# Patient Record
Sex: Female | Born: 1950 | Race: White | Hispanic: No | Marital: Married | Smoking: Never smoker
Health system: Southern US, Community
[De-identification: ages and names within clinical notes are randomized; demographics above are authoritative.]

## PROBLEM LIST (undated history)

## (undated) DIAGNOSIS — I10 Essential (primary) hypertension: Secondary | ICD-10-CM

## (undated) DIAGNOSIS — Z780 Asymptomatic menopausal state: Secondary | ICD-10-CM

## (undated) DIAGNOSIS — K219 Gastro-esophageal reflux disease without esophagitis: Secondary | ICD-10-CM

## (undated) DIAGNOSIS — E079 Disorder of thyroid, unspecified: Secondary | ICD-10-CM

## (undated) DIAGNOSIS — F32A Depression, unspecified: Secondary | ICD-10-CM

## (undated) DIAGNOSIS — K589 Irritable bowel syndrome without diarrhea: Secondary | ICD-10-CM

## (undated) DIAGNOSIS — F329 Major depressive disorder, single episode, unspecified: Secondary | ICD-10-CM

## (undated) DIAGNOSIS — M797 Fibromyalgia: Secondary | ICD-10-CM

## (undated) DIAGNOSIS — F419 Anxiety disorder, unspecified: Secondary | ICD-10-CM

## (undated) DIAGNOSIS — M1990 Unspecified osteoarthritis, unspecified site: Secondary | ICD-10-CM

## (undated) HISTORY — DX: Asymptomatic menopausal state: Z78.0

## (undated) HISTORY — DX: Gastro-esophageal reflux disease without esophagitis: K21.9

## (undated) HISTORY — DX: Irritable bowel syndrome, unspecified: K58.9

## (undated) HISTORY — DX: Disorder of thyroid, unspecified: E07.9

## (undated) HISTORY — DX: Major depressive disorder, single episode, unspecified: F32.9

## (undated) HISTORY — DX: Anxiety disorder, unspecified: F41.9

## (undated) HISTORY — PX: CHOLECYSTECTOMY: SHX55

## (undated) HISTORY — DX: Fibromyalgia: M79.7

## (undated) HISTORY — DX: Depression, unspecified: F32.A

## (undated) HISTORY — DX: Essential (primary) hypertension: I10

---

## 1999-12-09 ENCOUNTER — Other Ambulatory Visit: Admission: RE | Admit: 1999-12-09 | Discharge: 1999-12-09 | Payer: Self-pay | Admitting: *Deleted

## 2001-12-12 ENCOUNTER — Emergency Department (HOSPITAL_COMMUNITY): Admission: EM | Admit: 2001-12-12 | Discharge: 2001-12-12 | Payer: Self-pay | Admitting: Emergency Medicine

## 2001-12-12 ENCOUNTER — Encounter: Payer: Self-pay | Admitting: Emergency Medicine

## 2002-11-01 ENCOUNTER — Other Ambulatory Visit: Admission: RE | Admit: 2002-11-01 | Discharge: 2002-11-01 | Payer: Self-pay | Admitting: Family Medicine

## 2003-06-26 ENCOUNTER — Inpatient Hospital Stay (HOSPITAL_COMMUNITY): Admission: EM | Admit: 2003-06-26 | Discharge: 2003-06-27 | Payer: Self-pay | Admitting: Emergency Medicine

## 2004-01-02 ENCOUNTER — Ambulatory Visit: Payer: Self-pay | Admitting: Family Medicine

## 2004-01-29 ENCOUNTER — Ambulatory Visit: Payer: Self-pay | Admitting: Family Medicine

## 2004-02-12 ENCOUNTER — Ambulatory Visit: Payer: Self-pay | Admitting: Family Medicine

## 2004-02-12 ENCOUNTER — Other Ambulatory Visit: Admission: RE | Admit: 2004-02-12 | Discharge: 2004-02-12 | Payer: Self-pay | Admitting: Family Medicine

## 2004-03-19 ENCOUNTER — Ambulatory Visit: Payer: Self-pay | Admitting: Family Medicine

## 2004-05-13 ENCOUNTER — Ambulatory Visit: Payer: Self-pay | Admitting: Family Medicine

## 2004-05-20 ENCOUNTER — Ambulatory Visit: Payer: Self-pay | Admitting: Family Medicine

## 2004-06-19 ENCOUNTER — Ambulatory Visit: Payer: Self-pay | Admitting: Family Medicine

## 2004-08-07 ENCOUNTER — Ambulatory Visit: Payer: Self-pay | Admitting: Family Medicine

## 2005-01-01 ENCOUNTER — Ambulatory Visit: Payer: Self-pay | Admitting: Family Medicine

## 2005-03-19 ENCOUNTER — Ambulatory Visit: Payer: Self-pay | Admitting: Family Medicine

## 2005-04-09 ENCOUNTER — Ambulatory Visit: Payer: Self-pay | Admitting: Family Medicine

## 2005-11-25 ENCOUNTER — Ambulatory Visit: Payer: Self-pay | Admitting: Family Medicine

## 2005-11-25 LAB — CONVERTED CEMR LAB
ALT: 19 units/L (ref 0–40)
AST: 21 units/L (ref 0–37)
Albumin: 4 g/dL (ref 3.5–5.2)
Alkaline Phosphatase: 53 units/L (ref 39–117)
BUN: 12 mg/dL (ref 6–23)
Basophils Absolute: 0 10*3/uL (ref 0.0–0.1)
Basophils Relative: 0.2 % (ref 0.0–1.0)
CO2: 30 meq/L (ref 19–32)
Calcium: 9.3 mg/dL (ref 8.4–10.5)
Chloride: 105 meq/L (ref 96–112)
Chol/HDL Ratio, serum: 4.5
Cholesterol: 263 mg/dL (ref 0–200)
Creatinine, Ser: 0.8 mg/dL (ref 0.4–1.2)
Eosinophil percent: 0.7 % (ref 0.0–5.0)
GFR calc non Af Amer: 79 mL/min
Glomerular Filtration Rate, Af Am: 96 mL/min/{1.73_m2}
Glucose, Bld: 90 mg/dL (ref 70–99)
HCT: 40.6 % (ref 36.0–46.0)
HDL: 58.4 mg/dL (ref >39.0)
Hemoglobin: 13.7 g/dL (ref 12.0–15.0)
LDL DIRECT: 182.7 mg/dL
Lymphocytes Relative: 31.1 % (ref 12.0–46.0)
MCHC: 33.7 g/dL (ref 30.0–36.0)
MCV: 88.1 fL (ref 78.0–100.0)
Monocytes Absolute: 0.3 10*3/uL (ref 0.2–0.7)
Monocytes Relative: 5.3 % (ref 3.0–11.0)
Neutro Abs: 3.8 10*3/uL (ref 1.4–7.7)
Neutrophils Relative %: 62.7 % (ref 43.0–77.0)
Platelets: 299 10*3/uL (ref 150–400)
Potassium: 3.5 meq/L (ref 3.5–5.1)
RBC: 4.61 M/uL (ref 3.87–5.11)
RDW: 12.1 % (ref 11.5–14.6)
Sodium: 142 meq/L (ref 135–145)
TSH: 1.77 microintl units/mL (ref 0.35–5.50)
Total Bilirubin: 0.5 mg/dL (ref 0.3–1.2)
Total Protein: 6.8 g/dL (ref 6.0–8.3)
Triglyceride fasting, serum: 105 mg/dL (ref 0–149)
VLDL: 21 mg/dL (ref 0–40)
WBC: 5.9 10*3/uL (ref 4.5–10.5)

## 2005-12-02 ENCOUNTER — Other Ambulatory Visit: Admission: RE | Admit: 2005-12-02 | Discharge: 2005-12-02 | Payer: Self-pay | Admitting: Family Medicine

## 2005-12-02 ENCOUNTER — Ambulatory Visit: Payer: Self-pay | Admitting: Family Medicine

## 2005-12-02 ENCOUNTER — Encounter: Payer: Self-pay | Admitting: Family Medicine

## 2006-02-16 ENCOUNTER — Ambulatory Visit: Payer: Self-pay | Admitting: Family Medicine

## 2006-09-09 DIAGNOSIS — K219 Gastro-esophageal reflux disease without esophagitis: Secondary | ICD-10-CM

## 2006-10-06 ENCOUNTER — Telehealth: Payer: Self-pay | Admitting: Family Medicine

## 2006-10-12 ENCOUNTER — Telehealth: Payer: Self-pay | Admitting: Family Medicine

## 2006-12-15 ENCOUNTER — Telehealth: Payer: Self-pay | Admitting: Family Medicine

## 2006-12-28 ENCOUNTER — Ambulatory Visit: Payer: Self-pay | Admitting: Family Medicine

## 2006-12-28 DIAGNOSIS — F341 Dysthymic disorder: Secondary | ICD-10-CM | POA: Insufficient documentation

## 2006-12-28 DIAGNOSIS — D508 Other iron deficiency anemias: Secondary | ICD-10-CM

## 2006-12-30 ENCOUNTER — Telehealth: Payer: Self-pay | Admitting: Family Medicine

## 2007-01-18 ENCOUNTER — Telehealth: Payer: Self-pay | Admitting: Family Medicine

## 2007-02-10 ENCOUNTER — Telehealth: Payer: Self-pay | Admitting: Family Medicine

## 2007-02-12 LAB — CONVERTED CEMR LAB
Basophils Absolute: 0 10*3/uL (ref 0.0–0.1)
Basophils Relative: 0.5 % (ref 0.0–1.0)
Eosinophils Absolute: 0.1 10*3/uL (ref 0.0–0.6)
Eosinophils Relative: 0.7 % (ref 0.0–5.0)
HCT: 38.4 % (ref 36.0–46.0)
Hemoglobin: 13.2 g/dL (ref 12.0–15.0)
Lymphocytes Relative: 18 % (ref 12.0–46.0)
MCHC: 34.4 g/dL (ref 30.0–36.0)
MCV: 87.4 fL (ref 78.0–100.0)
Monocytes Absolute: 0.3 10*3/uL (ref 0.2–0.7)
Monocytes Relative: 3.1 % (ref 3.0–11.0)
Neutro Abs: 6.3 10*3/uL (ref 1.4–7.7)
Neutrophils Relative %: 77.7 % — ABNORMAL HIGH (ref 43.0–77.0)
Platelets: 313 10*3/uL (ref 150–400)
RBC: 4.39 M/uL (ref 3.87–5.11)
RDW: 12.3 % (ref 11.5–14.6)
WBC: 8.2 10*3/uL (ref 4.5–10.5)

## 2007-03-15 ENCOUNTER — Telehealth: Payer: Self-pay | Admitting: Family Medicine

## 2007-04-13 ENCOUNTER — Telehealth: Payer: Self-pay | Admitting: Family Medicine

## 2007-04-13 ENCOUNTER — Encounter: Payer: Self-pay | Admitting: Family Medicine

## 2007-05-03 ENCOUNTER — Ambulatory Visit: Payer: Self-pay | Admitting: Family Medicine

## 2007-10-06 ENCOUNTER — Ambulatory Visit: Payer: Self-pay | Admitting: Family Medicine

## 2007-10-06 DIAGNOSIS — K589 Irritable bowel syndrome without diarrhea: Secondary | ICD-10-CM | POA: Insufficient documentation

## 2007-11-02 ENCOUNTER — Other Ambulatory Visit: Admission: RE | Admit: 2007-11-02 | Discharge: 2007-11-02 | Payer: Self-pay | Admitting: Family Medicine

## 2007-11-02 ENCOUNTER — Encounter: Payer: Self-pay | Admitting: Family Medicine

## 2007-11-02 ENCOUNTER — Ambulatory Visit: Payer: Self-pay | Admitting: Family Medicine

## 2007-11-02 DIAGNOSIS — E538 Deficiency of other specified B group vitamins: Secondary | ICD-10-CM | POA: Insufficient documentation

## 2007-11-02 LAB — CONVERTED CEMR LAB
Bilirubin Urine: NEGATIVE
Blood in Urine, dipstick: NEGATIVE
Glucose, Urine, Semiquant: NEGATIVE
Ketones, urine, test strip: NEGATIVE
Nitrite: NEGATIVE
Specific Gravity, Urine: 1.025
Urobilinogen, UA: 0.2
WBC Urine, dipstick: NEGATIVE
pH: 6

## 2007-11-02 LAB — HM PAP SMEAR

## 2007-11-07 LAB — CONVERTED CEMR LAB
ALT: 27 units/L (ref 0–35)
AST: 27 units/L (ref 0–37)
Albumin: 3.8 g/dL (ref 3.5–5.2)
Alkaline Phosphatase: 54 units/L (ref 39–117)
BUN: 7 mg/dL (ref 6–23)
Basophils Absolute: 0.1 10*3/uL (ref 0.0–0.1)
Basophils Relative: 0.9 % (ref 0.0–3.0)
Bilirubin, Direct: 0.1 mg/dL (ref 0.0–0.3)
CO2: 29 meq/L (ref 19–32)
Calcium: 9.1 mg/dL (ref 8.4–10.5)
Chloride: 105 meq/L (ref 96–112)
Cholesterol: 307 mg/dL (ref 0–200)
Creatinine, Ser: 0.7 mg/dL (ref 0.4–1.2)
Direct LDL: 193.7 mg/dL
Eosinophils Absolute: 0.1 10*3/uL (ref 0.0–0.7)
Eosinophils Relative: 0.6 % (ref 0.0–5.0)
GFR calc Af Amer: 111 mL/min
GFR calc non Af Amer: 92 mL/min
Glucose, Bld: 98 mg/dL (ref 70–99)
HCT: 39.9 % (ref 36.0–46.0)
HDL: 64 mg/dL (ref >39.0)
Hemoglobin: 13.4 g/dL (ref 12.0–15.0)
Lymphocytes Relative: 14.9 % (ref 12.0–46.0)
MCHC: 33.7 g/dL (ref 30.0–36.0)
MCV: 87.4 fL (ref 78.0–100.0)
Monocytes Absolute: 0.4 10*3/uL (ref 0.1–1.0)
Monocytes Relative: 4.8 % (ref 3.0–12.0)
Neutro Abs: 6.8 10*3/uL (ref 1.4–7.7)
Neutrophils Relative %: 78.8 % — ABNORMAL HIGH (ref 43.0–77.0)
Platelets: 272 10*3/uL (ref 150–400)
Potassium: 4 meq/L (ref 3.5–5.1)
RBC: 4.57 M/uL (ref 3.87–5.11)
RDW: 13 % (ref 11.5–14.6)
Sodium: 142 meq/L (ref 135–145)
TSH: 0.91 microintl units/mL (ref 0.35–5.50)
Total Bilirubin: 0.6 mg/dL (ref 0.3–1.2)
Total CHOL/HDL Ratio: 4.8
Total Protein: 7 g/dL (ref 6.0–8.3)
Triglycerides: 152 mg/dL — ABNORMAL HIGH (ref 0–149)
VLDL: 30 mg/dL (ref 0–40)
Vit D, 1,25-Dihydroxy: 7 — ABNORMAL LOW (ref 30–89)
Vitamin B-12: 175 pg/mL — ABNORMAL LOW (ref 211–911)
WBC: 8.7 10*3/uL (ref 4.5–10.5)

## 2007-11-09 ENCOUNTER — Telehealth: Payer: Self-pay | Admitting: Family Medicine

## 2007-12-02 ENCOUNTER — Ambulatory Visit: Payer: Self-pay | Admitting: Family Medicine

## 2007-12-22 ENCOUNTER — Telehealth: Payer: Self-pay | Admitting: Family Medicine

## 2007-12-27 ENCOUNTER — Ambulatory Visit: Payer: Self-pay | Admitting: Family Medicine

## 2007-12-28 ENCOUNTER — Telehealth: Payer: Self-pay | Admitting: Family Medicine

## 2008-02-15 ENCOUNTER — Ambulatory Visit: Payer: Self-pay | Admitting: Family Medicine

## 2008-03-15 ENCOUNTER — Telehealth: Payer: Self-pay | Admitting: Family Medicine

## 2008-03-22 ENCOUNTER — Ambulatory Visit: Payer: Self-pay | Admitting: Family Medicine

## 2008-03-22 ENCOUNTER — Telehealth: Payer: Self-pay | Admitting: Family Medicine

## 2008-04-18 ENCOUNTER — Ambulatory Visit: Payer: Self-pay | Admitting: Family Medicine

## 2008-04-21 LAB — CONVERTED CEMR LAB
Eosinophils Relative: 0.6 % (ref 0.0–5.0)
HCT: 38 % (ref 36.0–46.0)
Lymphs Abs: 1.6 10*3/uL (ref 0.7–4.0)
MCHC: 34.5 g/dL (ref 30.0–36.0)
MCV: 85.4 fL (ref 78.0–100.0)
Monocytes Absolute: 0.3 10*3/uL (ref 0.1–1.0)
Platelets: 268 10*3/uL (ref 150.0–400.0)
RDW: 12.2 % (ref 11.5–14.6)
WBC: 5.8 10*3/uL (ref 4.5–10.5)

## 2008-06-05 ENCOUNTER — Ambulatory Visit: Payer: Self-pay | Admitting: Family Medicine

## 2008-06-05 LAB — CONVERTED CEMR LAB: Cholesterol, target level: 200 mg/dL

## 2008-06-07 ENCOUNTER — Telehealth: Payer: Self-pay | Admitting: Family Medicine

## 2008-06-08 LAB — CONVERTED CEMR LAB
Basophils Absolute: 0 10*3/uL (ref 0.0–0.1)
Basophils Relative: 0.2 % (ref 0.0–3.0)
Eosinophils Absolute: 0 10*3/uL (ref 0.0–0.7)
Eosinophils Relative: 0.3 % (ref 0.0–5.0)
HCT: 41.1 % (ref 36.0–46.0)
Hemoglobin: 13.9 g/dL (ref 12.0–15.0)
Lymphocytes Relative: 25.8 % (ref 12.0–46.0)
Lymphs Abs: 1.9 10*3/uL (ref 0.7–4.0)
MCHC: 33.8 g/dL (ref 30.0–36.0)
MCV: 88.3 fL (ref 78.0–100.0)
Monocytes Absolute: 0.3 10*3/uL (ref 0.1–1.0)
Monocytes Relative: 4.3 % (ref 3.0–12.0)
Neutro Abs: 5.1 10*3/uL (ref 1.4–7.7)
Neutrophils Relative %: 69.4 % (ref 43.0–77.0)
Platelets: 313 10*3/uL (ref 150.0–400.0)
RBC: 4.66 M/uL (ref 3.87–5.11)
RDW: 13.8 % (ref 11.5–14.6)
WBC: 7.3 10*3/uL (ref 4.5–10.5)

## 2008-07-11 ENCOUNTER — Ambulatory Visit: Payer: Self-pay | Admitting: Family Medicine

## 2008-07-11 DIAGNOSIS — M19042 Primary osteoarthritis, left hand: Secondary | ICD-10-CM

## 2008-07-11 DIAGNOSIS — M19041 Primary osteoarthritis, right hand: Secondary | ICD-10-CM

## 2008-10-03 ENCOUNTER — Ambulatory Visit: Payer: Self-pay | Admitting: Family Medicine

## 2008-10-03 LAB — CONVERTED CEMR LAB
Bilirubin Urine: NEGATIVE
Glucose, Urine, Semiquant: NEGATIVE
Nitrite: NEGATIVE
Specific Gravity, Urine: 1.02
pH: 6

## 2008-10-04 ENCOUNTER — Telehealth: Payer: Self-pay | Admitting: Family Medicine

## 2008-10-04 LAB — CONVERTED CEMR LAB
Eosinophils Absolute: 0 10*3/uL (ref 0.0–0.7)
MCHC: 33.3 g/dL (ref 30.0–36.0)
MCV: 88.5 fL (ref 78.0–100.0)
Monocytes Absolute: 0.3 10*3/uL (ref 0.1–1.0)
Neutrophils Relative %: 80.4 % — ABNORMAL HIGH (ref 43.0–77.0)
Platelets: 253 10*3/uL (ref 150.0–400.0)
RDW: 11.9 % (ref 11.5–14.6)

## 2008-11-15 ENCOUNTER — Telehealth: Payer: Self-pay | Admitting: Family Medicine

## 2008-11-15 ENCOUNTER — Ambulatory Visit: Payer: Self-pay | Admitting: Family Medicine

## 2008-11-15 DIAGNOSIS — R350 Frequency of micturition: Secondary | ICD-10-CM

## 2008-11-15 LAB — CONVERTED CEMR LAB
Bilirubin Urine: NEGATIVE
Glucose, Urine, Semiquant: NEGATIVE
Urobilinogen, UA: 0.2
pH: 6.5

## 2008-11-19 ENCOUNTER — Ambulatory Visit: Payer: Self-pay | Admitting: Family Medicine

## 2008-11-21 ENCOUNTER — Telehealth: Payer: Self-pay | Admitting: Family Medicine

## 2008-11-28 ENCOUNTER — Ambulatory Visit: Payer: Self-pay | Admitting: Family Medicine

## 2008-11-28 DIAGNOSIS — R000 Tachycardia, unspecified: Secondary | ICD-10-CM

## 2008-12-02 LAB — CONVERTED CEMR LAB: Vit D, 25-Hydroxy: 15 ng/mL — ABNORMAL LOW (ref 30–89)

## 2008-12-04 LAB — CONVERTED CEMR LAB
ALT: 25 units/L (ref 0–35)
AST: 29 units/L (ref 0–37)
Albumin: 4 g/dL (ref 3.5–5.2)
BUN: 11 mg/dL (ref 6–23)
Basophils Relative: 0.3 % (ref 0.0–3.0)
Chloride: 105 meq/L (ref 96–112)
Eosinophils Relative: 0.8 % (ref 0.0–5.0)
HCT: 37.9 % (ref 36.0–46.0)
Hemoglobin: 13.2 g/dL (ref 12.0–15.0)
Lymphs Abs: 1.6 10*3/uL (ref 0.7–4.0)
MCV: 86.7 fL (ref 78.0–100.0)
Monocytes Absolute: 0.3 10*3/uL (ref 0.1–1.0)
Monocytes Relative: 6.1 % (ref 3.0–12.0)
Neutro Abs: 3.2 10*3/uL (ref 1.4–7.7)
Platelets: 306 10*3/uL (ref 150.0–400.0)
Potassium: 3.7 meq/L (ref 3.5–5.1)
Sodium: 142 meq/L (ref 135–145)
TSH: 1.54 microintl units/mL (ref 0.35–5.50)
Total Bilirubin: 0.7 mg/dL (ref 0.3–1.2)
Total Protein: 6.8 g/dL (ref 6.0–8.3)
VLDL: 28 mg/dL (ref 0.0–40.0)
Vitamin B-12: 562 pg/mL (ref 211–911)
WBC: 5.1 10*3/uL (ref 4.5–10.5)

## 2008-12-12 ENCOUNTER — Encounter: Payer: Self-pay | Admitting: Family Medicine

## 2008-12-14 ENCOUNTER — Ambulatory Visit: Payer: Self-pay

## 2008-12-14 ENCOUNTER — Encounter: Payer: Self-pay | Admitting: Family Medicine

## 2008-12-14 ENCOUNTER — Ambulatory Visit (HOSPITAL_COMMUNITY): Admission: RE | Admit: 2008-12-14 | Discharge: 2008-12-14 | Payer: Self-pay | Admitting: Family Medicine

## 2008-12-14 ENCOUNTER — Ambulatory Visit: Payer: Self-pay | Admitting: Internal Medicine

## 2008-12-19 ENCOUNTER — Telehealth: Payer: Self-pay | Admitting: Family Medicine

## 2008-12-20 ENCOUNTER — Telehealth: Payer: Self-pay | Admitting: Family Medicine

## 2008-12-25 ENCOUNTER — Encounter (INDEPENDENT_AMBULATORY_CARE_PROVIDER_SITE_OTHER): Payer: Self-pay | Admitting: *Deleted

## 2008-12-25 ENCOUNTER — Encounter: Payer: Self-pay | Admitting: Family Medicine

## 2009-01-21 ENCOUNTER — Telehealth: Payer: Self-pay | Admitting: Family Medicine

## 2009-04-04 ENCOUNTER — Telehealth: Payer: Self-pay | Admitting: Family Medicine

## 2009-04-10 ENCOUNTER — Encounter: Payer: Self-pay | Admitting: Family Medicine

## 2009-04-18 ENCOUNTER — Ambulatory Visit: Payer: Self-pay | Admitting: Family Medicine

## 2009-04-18 DIAGNOSIS — M949 Disorder of cartilage, unspecified: Secondary | ICD-10-CM

## 2009-04-18 DIAGNOSIS — M899 Disorder of bone, unspecified: Secondary | ICD-10-CM

## 2009-06-12 ENCOUNTER — Ambulatory Visit: Payer: Self-pay | Admitting: Family Medicine

## 2009-06-12 DIAGNOSIS — M5380 Other specified dorsopathies, site unspecified: Secondary | ICD-10-CM | POA: Insufficient documentation

## 2009-06-13 ENCOUNTER — Telehealth: Payer: Self-pay | Admitting: Family Medicine

## 2009-09-03 ENCOUNTER — Ambulatory Visit: Payer: Self-pay | Admitting: Family Medicine

## 2009-09-03 DIAGNOSIS — K120 Recurrent oral aphthae: Secondary | ICD-10-CM

## 2009-09-03 DIAGNOSIS — N390 Urinary tract infection, site not specified: Secondary | ICD-10-CM

## 2009-09-03 DIAGNOSIS — G43909 Migraine, unspecified, not intractable, without status migrainosus: Secondary | ICD-10-CM

## 2009-09-03 LAB — CONVERTED CEMR LAB
Nitrite: NEGATIVE
Protein, U semiquant: NEGATIVE
Urobilinogen, UA: 0.2

## 2009-10-04 ENCOUNTER — Telehealth: Payer: Self-pay | Admitting: Family Medicine

## 2009-12-18 ENCOUNTER — Ambulatory Visit: Payer: Self-pay

## 2009-12-18 ENCOUNTER — Encounter: Payer: Self-pay | Admitting: Family Medicine

## 2009-12-18 DIAGNOSIS — I6529 Occlusion and stenosis of unspecified carotid artery: Secondary | ICD-10-CM

## 2010-01-28 ENCOUNTER — Telehealth: Payer: Self-pay | Admitting: Family Medicine

## 2010-02-12 ENCOUNTER — Telehealth: Payer: Self-pay | Admitting: Family Medicine

## 2010-02-18 NOTE — Progress Notes (Signed)
Summary: head drainage & into chest st pt  Phone Note Call from Patient Call back at Home Phone [phone]   Call For: Mandy Diaz for [facility] Summary of Call: Dr. Quitman Livings said to get fluid behind ear cleared up.  Cannot take Sudafed.  In past he gave phenavent generic for Extrex LA caps.  Requests new Rx as this one has run out.  Beginning to go into chest.   Could you also call in med for cough that keeps her awake at night. No fever she thinks, but eyes feel hot & swimmy headed.  No transportation today.  [facility] [address] [phone].  Allergic to Pencillin, sulfa,reglan.   Initial call taken by: Rudy Jew, RN,  October 04, 2009 10:42 AM  Follow-up for Phone Call        I am not familiar with phenvent. Do recommend Phenylephrine 10mg  by mouth three times a day as needed congestion, disp # 40, 1 rf and can have Tussionex 1 tsp by mouth at bedtime as needed courgh, 4 oz with out refill if does not improve needs to come in for evaluation Follow-up by: Danise Edge MD,  October 04, 2009 12:05 PM    New/Updated Medications: PHENYLEPHRINE HCL 10 MG TABS (PHENYLEPHRINE HCL) by mouth three times a day as needed for congestion TUSSIONEX PENNKINETIC ER 10-8 MG/5ML LQCR (HYDROCOD POLST-CHLORPHEN POLST) 1 tsp by mouth at bedtime as needed cough Prescriptions: TUSSIONEX PENNKINETIC ER 10-8 MG/5ML LQCR (HYDROCOD POLST-CHLORPHEN POLST) 1 tsp by mouth at bedtime as needed cough  #4 oz x 0   Entered by:   Josph Macho RMA   Authorized by:   Danise Edge MD   Signed by:   Josph Macho RMA on 10/04/2009   Method used:   Telephoned to ...       [facility] [address] [phone]* (retail)       [address]       [place], [place]  [address]       Ph: [phone]       Fax: [phone]   RxID:   [id number] PHENYLEPHRINE HCL 10 MG TABS (PHENYLEPHRINE HCL) by mouth three times a day as needed for congestion  #40 x 1   Entered by:   Josph Macho RMA  Authorized by:   Danise Edge MD   Signed by:   Josph Macho RMA on 10/04/2009   Method used:   Electronically to        [facility] [phone]* (retail)       [address]       [place], [place]  [address]       Ph: [phone]       Fax: [phone]   RxID:   [id number]

## 2010-02-18 NOTE — Progress Notes (Signed)
Summary: refill zolpidem  10 mg   Phone Note From Pharmacy   Caller: [facility] [address] [address] [phone]* Reason for Call: Needs renewal Summary of Call: refill zolpidem 10 mg  Initial call taken by: Pura Spice, RN,  April 04, 2009 4:58 PM  Follow-up for Phone Call        per dr Alfonzo Feller ok x 3 refills  [facility] [address]  Follow-up by: Pura Spice, RN,  April 04, 2009 4:59 PM    New/Updated Medications: ZOLPIDEM TARTRATE 10 MG TABS (ZOLPIDEM TARTRATE) 1 by mouth hs as needed sleep Prescriptions: ZOLPIDEM TARTRATE 10 MG TABS (ZOLPIDEM TARTRATE) 1 by mouth hs as needed sleep  #30 x 3   Entered by:   Pura Spice, RN   Authorized by:   [name] MD   Signed by:   Pura Spice, RN on 04/04/2009   Method used:   Printed then faxed to ...       [facility] [address] [phone]* (retail)       [address]       [place], [place]  [address]       Ph: [phone]       Fax: [phone]   RxID:   [id number]

## 2010-02-18 NOTE — Assessment & Plan Note (Signed)
Summary: BLADDER INF? // RS   Vital Signs:  Patient profile:   60 year old female Menstrual status:  postmenopausal O2 Sat:      95 % Temp:     98.7 degrees F Pulse rate:   110 / minute Pulse rhythm:   regular BP sitting:   136 / 86  Vitals Entered By: Pura Spice, RN (September 03, 2009 4:26 PM) CC: UTI had buring thisvam but burning better and wants refills librax,celexa,ambine promethazine has not had alprzolalm since march wants willbururin andprempro    History of Present Illness: This 60 year old white married female it in complaining of urinary frequency and dysuria this morning some better this afternoon but also has some low abdominal pain no CVA pain and no hematuria Patient is under considerable stress with a mostly healed husband as well as a sick daughter and a grandson that has a cardiac R. Orson Slick been treated at [facility] cardiologists  Center Patient requests multiple refills this will be taken care of Complains of bruising easily and instructed to take vitamin C 500 mg b.i.d. as well as now mild in daily intake of aspirin The patient's main problem is despite taking citalopram and Wellbutrin she continues to be depressed and we have considered adding Cymbalta  Allergies: 1)  ! Pcn 2)  ! Reglan 3)  Amoxicillin (Amoxicillin) 4)  Metoclopramide Hcl (Metoclopramide Hcl) 5)  Sulfamethoxazole (Sulfamethoxazole)  Past History:  Past Medical History: Last updated: 09/09/2006 Menopause GERD Urinary incontinence Migraines  Past Surgical History: Last updated: 09/09/2006 Cholecystectomy  Social History: Last updated: 09/09/2006 Married Never Smoked Alcohol use-no Drug use-no Regular exercise-no  Risk Factors: Smoking Status: never (09/09/2006)  Review of Systems      See HPI  The patient denies anorexia, fever, weight loss, weight gain, vision loss, decreased hearing, hoarseness, chest pain, syncope, dyspnea on exertion, peripheral edema, prolonged  cough, headaches, hemoptysis, abdominal pain, melena, hematochezia, severe indigestion/heartburn, hematuria, incontinence, genital sores, muscle weakness, suspicious skin lesions, transient blindness, difficulty walking, depression, unusual weight change, abnormal bleeding, enlarged lymph nodes, angioedema, breast masses, and testicular masses.    Physical Exam  General:  Well-developed,well-nourished,in no acute distress; alert,appropriate and cooperative throughout examination Head:  Normocephalic and atraumatic without obvious abnormalities. No apparent alopecia or balding. Eyes:  No corneal or conjunctival inflammation noted. EOMI. Perrla. Funduscopic exam benign, without hemorrhages, exudates or papilledema. Vision grossly normal. Ears:  External ear exam shows no significant lesions or deformities.  Otoscopic examination reveals clear canals, tympanic membranes are intact bilaterally without bulging, retraction, inflammation or discharge. Hearing is grossly normal bilaterally. Nose:  External nasal examination shows no deformity or inflammation. Nasal mucosa are pink and moist without lesions or exudates. Mouth:  small ulcerations in the mouth Neck:  No deformities, masses, or tenderness noted. Chest Wall:  No deformities, masses, or tenderness noted. Breasts:  my exam Lungs:  Normal respiratory effort, chest expands symmetrically. Lungs are clear to auscultation, no crackles or wheezes. Heart:  Normal rate and regular rhythm. S1 and S2 normal without gallop, murmur, click, rub or other extra sounds. Abdomen:  tenderness over the suprapubic regionnormal bowel sounds, no distention, no masses, no guarding, no rigidity, no rebound tenderness, no abdominal hernia, no inguinal hernia, no hepatomegaly, and no splenomegaly.   Rectal:  not examined Genitalia:  not examined Msk:  No deformity or scoliosis noted of thoracic or lumbar spine.   Pulses:  R and L carotid,radial,femoral,dorsalis pedis and  posterior tibial pulses are full and  equal bilaterally Extremities:  No clubbing, cyanosis, edema, or deformity noted with normal full range of motion of all joints.   Psych:  Cognition and judgment appear intact. Alert and cooperative with normal attention span and concentration. No apparent delusions, illusions, hallucinations   Impression & Recommendations:  Problem # 1:  MIGRAINE HEADACHE (ICD-346.90) Assessment Deteriorated  Her updated medication list for this problem includes:    Imitrex 100 Mg Tabs (Sumatriptan succinate) .Marland Kitchen... Take 1 at onset headache and may repeat x 1    Isometheptene-apap-dichloral 65-325-100 Mg Caps (Apap-isometheptene-dichloral) .Marland Kitchen... Take 1 capsule by mouth every 8 hrs as needed headache.    Tramadol Hcl 50 Mg Tabs (Tramadol hcl) .Marland Kitchen... 1-2 q4h as needed  for pain    Fioricet 50-325-40 Mg Tabs (Butalbital-apap-caffeine) .Marland Kitchen... 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  Problem # 3:  UTI (ICD-599.0) Assessment: New  Her updated medication list for this problem includes:    Ciprofloxacin Hcl 500 Mg Tabs (Ciprofloxacin hcl) .Marland Kitchen... 1 two times a day for uti  Orders: UA Dipstick w/o Micro (automated)  (81003)  Problem # 4:  FREQUENCY, URINARY (ICD-788.41) Assessment: Deteriorated  Problem # 5:  ARTHRITIS, HANDS, BILATERAL (ICD-716.94) Assessment: Unchanged  Problem # 6:  ANXIETY DEPRESSION (ICD-300.4) Assessment: Deteriorated  Problem # 7:  GERD (ICD-530.81) Assessment: Unchanged  Her updated medication list for this problem includes:    Prilosec 40 Mg Cpdr (Omeprazole) .Marland Kitchen... 1 two times a day for severe gerd    Librax 2.5-5 Mg Caps (Clidinium-chlordiazepoxide) .Marland Kitchen... 1 qid  for irritable bowel  Problem # 8:  IRRITABLE BOWEL SYNDROME (ICD-564.1) Assessment: Unchanged  Complete Medication List: 1)  Prempro 0.3-1.5 Mg Tabs (Conj estrog-medroxyprogest  ace) .Marland Kitchen.. 1 qd 2)  Wellbutrin Xl 300 Mg Tb24 (Bupropion hcl) .... Once daily for depression 3)  Imitrex 100 Mg Tabs (Sumatriptan succinate) .... Take 1 at onset headache and may repeat x 1 4)  Ondansetron Hcl 4 Mg Tabs (Ondansetron hcl) .Marland Kitchen.. 1-2 q4-6 h as needed nausea 5)  Isometheptene-apap-dichloral 65-325-100 Mg Caps (Apap-isometheptene-dichloral) .... Take 1 capsule by mouth every 8 hrs as needed headache. 6)  Zolpidem Tartrate 10 Mg Tabs (Zolpidem tartrate) .Marland Kitchen.. 1 by mouth hs as needed sleep 7)  Celexa 40 Mg Tabs (Citalopram hydrobromide) .Marland Kitchen.. 1 once daily 8)  Alprazolam 0.5 Mg Tabs (Alprazolam) .Marland Kitchen.. 1 morn midafternoon and hs for stress or anxiety 9)  Prilosec 40 Mg Cpdr (Omeprazole) .Marland Kitchen.. 1 two times a day for severe gerd 10)  Vitamin D 16109 Unit Caps (Ergocalciferol) .Marland Kitchen.. 1 by mouth weekly xzx 12 weeks 11)  Cyanocobalamin 1000 Mcg/ml Soln (Cyanocobalamin) .... Inject monthly 12)  Bd Disp Needle 25g X 1" Misc (Needle (disp)) 13)  Zyrtec Allergy 10 Mg Tabs (Cetirizine hcl) .Marland Kitchen.. 1 qd 14)  Valtrex 500 Mg Tabs (Valacyclovir hcl) .Marland Kitchen.. 1 three times a day for viral infection 15)  Tramadol Hcl 50 Mg Tabs (Tramadol hcl) .Marland Kitchen.. 1-2 q4h as needed  for pain 16)  Diazepam 5 Mg Tabs (Diazepam) .Marland Kitchen.. 1 three times a day as needed stress 17)  Fioricet 50-325-40 Mg Tabs (Butalbital-apap-caffeine) .Marland Kitchen.. 1-2 q4h as needed tension headache , not over 4 per day 18)  Actonel 150 Mg Tabs (Risedronate sodium) .Marland Kitchen.. 1 monthly  for osteoporosis 19)  Ciprofloxacin Hcl 500 Mg Tabs (Ciprofloxacin hcl) .Marland Kitchen.. 1 two times a day for uti 20)  Flexeril 10 Mg  Tabs (Cyclobenzaprine hcl) .Marland Kitchen.. 1 morn midafternoon and hs for musclespasm, take only 1,2 if dizzineaa 21)  Promethazine Hcl 25 Mg Tabs (Promethazine hcl) .Marland Kitchen.. 1-2 q4-6 as needed toprevent nausea or vomiting 22)  Librax 2.5-5 Mg Caps (Clidinium-chlordiazepoxide) .Marland Kitchen.. 1 qid  for irritable bowel  Patient Instructions: 1)  no new problem of herpetic stomatitis be  treated with Valtrex t.i.d. for 10 days 2)  Urinary tract infection will be treated with ciprofloxacin 500 mg t.i.d. for 10 day 3)  Every feel all of your other medications for your multiple problems 4)  Return 2 weeks for urinalysis Prescriptions: ALPRAZOLAM 0.5 MG TABS (ALPRAZOLAM) 1 morn midafternoon and hs for stress or anxiety  #90 x 5   Entered by:   Pura Spice, RN   Authorized by:   [name] MD   Signed by:   Pura Spice, RN on 09/04/2009   Method used:   Telephoned to ...       [facility] [address] [phone]* (retail)       [address]       [place], [place]  [address]       Ph: [phone]       Fax: [phone]   RxID:   [id number] ZOLPIDEM TARTRATE 10 MG TABS (ZOLPIDEM TARTRATE) 1 by mouth hs as needed sleep  #30 x 5   Entered by:   Pura Spice, RN   Authorized by:   [name] MD   Signed by:   Pura Spice, RN on 09/04/2009   Method used:   Telephoned to ...       [facility] [phone]* (retail)       [address]       [place], [place]  [address]       Ph: [phone]       Fax: [phone]   RxID:   [id number] LIBRAX 2.5-5 MG CAPS (CLIDINIUM-CHLORDIAZEPOXIDE) 1 qid  for irritable bowel  #120 x 11   Entered and Authorized by:   [name] MD   Signed by:   [name] MD on 09/03/2009   Method used:   Electronically to        [facility] [phone]* (retail)       [address]       [place], [place]  [address]       Ph: [phone]       Fax: [phone]   RxID:   [id number] PROMETHAZINE HCL 25 MG TABS (PROMETHAZINE HCL) 1-2 q4-6 as needed toprevent nausea or vomiting  #60 x 5   Entered and Authorized by:   [name] MD   Signed by:   [name] MD on 09/03/2009   Method used:   Electronically to        [facility] [phone]* (retail)       [address]       [place], [place]  [address]       Ph: [phone]       Fax: [phone]   RxID:   [id number] PRILOSEC 40 MG CPDR (OMEPRAZOLE) 1 two times a day for severe gerd  #60 x 11   Entered and Authorized  by:   [name] MD   Signed by:   [name] MD on 09/03/2009   Method used:   Electronically to        [facility] [phone]* (retail)       [address]       [place], [place]  [address]       Ph: [phone]       Fax: [phone]   RxID:   [id number] CIPROFLOXACIN HCL 500 MG TABS (CIPROFLOXACIN HCL) 1 two times a day for UTI  #30 x 1   Entered and Authorized by:   [name] MD   Signed by:   [name] MD on 09/03/2009   Method used:   Electronically to        [facility] [phone]* (retail)       [address]       [place], [place]  [address]       Ph: [phone]       Fax: [phone]   RxID:   [id number] PREMPRO 0.3-1.5 MG TABS (CONJ ESTROG-MEDROXYPROGEST ACE) 1 qd  #30 x 11   Entered and Authorized by:   [name] MD   Signed by:   [name] MD on 09/03/2009   Method used:   Electronically to        [facility] [phone]* (retail)       [address]       [place], [place]  [address]       Ph: [phone]       Fax: [phone]   RxID:   [id number] CELEXA 40 MG TABS (CITALOPRAM HYDROBROMIDE) 1 once daily  #30 x 11   Entered and Authorized by:   [name] MD   Signed by:   [name] MD on 09/03/2009   Method used:   Electronically to        [facility] [phone]* (retail)       [address]       [place], [place]  [address]       Ph: [phone]       Fax: [phone]   RxID:   [id number] WELLBUTRIN XL 300 MG  TB24 (BUPROPION HCL) once daily for depression  #30 x 11   Entered and Authorized by:   [name] MD    Signed by:   [name] MD on 09/03/2009   Method used:   Electronically to        [facility] [phone]* (retail)       [address]       [place], [place]  [address]       Ph: [phone]       Fax: [phone]   RxID:   [id number] ALPRAZOLAM 0.5 MG TABS (ALPRAZOLAM) 1 morn midafternoon and hs for stress or anxiety  #90 x 5   Entered and Authorized by:   [name] MD   Signed by:   [name] MD on 09/03/2009   Method used:   Print then Give to Patient   RxID:  [id number] ZOLPIDEM TARTRATE 10 MG TABS (ZOLPIDEM TARTRATE) 1 by mouth hs as needed sleep  #30 x 11   Entered and Authorized by:   [name] MD   Signed by:   [name] MD on 09/03/2009   Method used:   Print then Give to Patient   RxID:   [id number]   Laboratory Results   Urine Tests    Routine Urinalysis   Color: yellow Appearance: Clear Glucose: negative   (Normal Range: Negative) Bilirubin: negative   (Normal Range: Negative) Ketone: negative   (Normal Range: Negative) Spec. Gravity: <1.005   (Normal Range: 1.003-1.035) Blood: trace-lysed   (Normal Range: Negative) pH: 5.0   (Normal Range: 5.0-8.0) Protein: negative   (Normal Range: Negative) Urobilinogen: 0.2   (Normal Range: 0-1) Nitrite: negative   (Normal Range: Negative) Leukocyte Esterace: trace   (Normal Range: Negative)    Comments: Rita Ohara  September 03, 2009 4:23 PM

## 2010-02-18 NOTE — Progress Notes (Signed)
Summary: headache  Phone Note Call from Patient   Caller: Patient Call For: [name] MD Summary of Call: Mandy Diaz ([facility]) [phone] Pt states the Toradol or Fioricet is not helping her headache.  Initial call taken by: Lynann Beaver CMA,  Jun 13, 2009 10:45 AM  Follow-up for Phone Call        dr Alfonzo Feller said take both of them at same time then let us know  take 2 of each med at same time and let us know  Follow-up by: Pura Spice, RN,  Jun 13, 2009 10:50 AM  Additional Follow-up for Phone Call Additional follow up Details #1::        Pt notified of Dr. Charmian Muff recommendations. Additional Follow-up by: Lynann Beaver CMA,  Jun 13, 2009 10:54 AM

## 2010-02-18 NOTE — Assessment & Plan Note (Signed)
Summary: med check/discuss new med/cjr   Vital Signs:  Patient profile:   60 year old female Menstrual status:  postmenopausal O2 Sat:      96 % Pulse rate:   108 / minute Pulse rhythm:   regular BP sitting:   140 / 84  (left arm)  Vitals Entered By: Pura Spice, RN (Jun 12, 2009 3:59 PM) CC: back spasms started last nite.    History of Present Illness: This 60 year old white married female drain her back one day ago began having severe muscle spasm of the lumbar spine last light. No radiation of pain but localized of the lumbosacral spine Other complaints and cyst of sore throat with cervical glands. He relates he had fever resolved and in increased headache Next is small bump on the back of her head over the occipital region  Allergies: 1)  ! Pcn 2)  ! Reglan 3)  Amoxicillin (Amoxicillin) 4)  Metoclopramide Hcl (Metoclopramide Hcl) 5)  Sulfamethoxazole (Sulfamethoxazole)  Past History:  Past Medical History: Last updated: 09/09/2006 Menopause GERD Urinary incontinence Migraines  Past Surgical History: Last updated: 09/09/2006 Cholecystectomy  Social History: Last updated: 09/09/2006 Married Never Smoked Alcohol use-no Drug use-no Regular exercise-no  Risk Factors: Smoking Status: never (09/09/2006)  Review of Systems      See HPI  The patient denies anorexia, fever, weight loss, weight gain, vision loss, decreased hearing, hoarseness, chest pain, syncope, dyspnea on exertion, peripheral edema, prolonged cough, headaches, hemoptysis, abdominal pain, melena, hematochezia, severe indigestion/heartburn, hematuria, incontinence, genital sores, muscle weakness, suspicious skin lesions, transient blindness, difficulty walking, depression, unusual weight change, abnormal bleeding, enlarged lymph nodes, angioedema, breast masses, and testicular masses.    Physical Exam  General:  Well-developed,well-nourished,in no acute distress; alert,appropriate and  cooperative throughout examination Head:  small evidence of cellulitis with beginning boil Eyes:  No corneal or conjunctival inflammation noted. EOMI. Perrla. Funduscopic exam benign, without hemorrhages, exudates or papilledema. Vision grossly normal. Ears:  External ear exam shows no significant lesions or deformities.  Otoscopic examination reveals clear canals, tympanic membranes are intact bilaterally without bulging, retraction, inflammation or discharge. Hearing is grossly normal bilaterally. Nose:  minimal congestion Mouth:  pharyngeal erythema.   Lungs:  Normal respiratory effort, chest expands symmetrically. Lungs are clear to auscultation, no crackles or wheezes. Heart:  Normal rate and regular rhythm. S1 and S2 normal without gallop, murmur, click, rub or other extra sounds. Msk:  Mark lumbar muscle spasm bilaterally Skin:  1 cm area of saline this was slight elevation number erythematous and tender   Impression & Recommendations:  Problem # 1:  CELLULITIS (ICD-682.9) Assessment New  Her updated medication list for this problem includes:    Ciprofloxacin Hcl 500 Mg Tabs (Ciprofloxacin hcl) .Marland Kitchen... 1 two times a day for infected throat and boil  Problem # 2:  HEADACHE (ICD-784.0) Assessment: Deteriorated  Her updated medication list for this problem includes:    Imitrex 100 Mg Tabs (Sumatriptan succinate) .Marland Kitchen... Take 1 at onset headache and may repeat x 1    Isometheptene-apap-dichloral 65-325-100 Mg Caps (Apap-isometheptene-dichloral) .Marland Kitchen... Take 1 capsule by mouth every 8 hrs as needed headache.    Tramadol Hcl 50 Mg Tabs (Tramadol hcl) .Marland Kitchen... 1-2 q4h as needed  for pain    Fioricet 50-325-40 Mg Tabs (Butalbital-apap-caffeine) .Marland Kitchen... 1-2 q4h as needed tension headache , not over 4 per day  Problem # 3:  ACUTE PHARYNGITIS (ICD-462) Assessment: Deteriorated  Her updated medication list for this problem includes:    Ciprofloxacin  Hcl 500 Mg Tabs (Ciprofloxacin hcl) .Marland Kitchen... 1 two  times a day for infected throat and boil  Problem # 4:  MUSCLE SPASM, BACK (ICD-724.8) Assessment: New  Her updated medication list for this problem includes:    Tramadol Hcl 50 Mg Tabs (Tramadol hcl) .Marland Kitchen... 1-2 q4h as needed  for pain    Fioricet 50-325-40 Mg Tabs (Butalbital-apap-caffeine) .Marland Kitchen... 1-2 q4h as needed tension headache , not over 4 per day    Flexeril 10 Mg Tabs (Cyclobenzaprine hcl) .Marland Kitchen... 1 morn midafternoon and hs for musclespasm, take only 1,2 if dizzineaa  Problem # 5:  GERD (ICD-530.81) Assessment: Improved  Her updated medication list for this problem includes:    Prilosec 40 Mg Cpdr (Omeprazole) .Marland Kitchen... 1 two times a day for severe gerd  Complete Medication List: 1)  Prempro 0.3-1.5 Mg Tabs (Conj estrog-medroxyprogest ace) .Marland Kitchen.. 1 qd 2)  Wellbutrin Xl 300 Mg Tb24 (Bupropion hcl) .... Once daily 3)  Imitrex 100 Mg Tabs (Sumatriptan succinate) .... Take 1 at onset headache and may repeat x 1 4)  Ondansetron Hcl 4 Mg Tabs (Ondansetron hcl) .Marland Kitchen.. 1-2 q4-6 h as needed nausea 5)  Isometheptene-apap-dichloral 65-325-100 Mg Caps (Apap-isometheptene-dichloral) .... Take 1 capsule by mouth every 8 hrs as needed headache. 6)  Zolpidem Tartrate 10 Mg Tabs (Zolpidem tartrate) .Marland Kitchen.. 1 by mouth hs as needed sleep 7)  Celexa 40 Mg Tabs (Citalopram hydrobromide) .Marland Kitchen.. 1 once daily 8)  Alprazolam 0.5 Mg Tabs (Alprazolam) .Marland Kitchen.. 1 morn midafternoon and hs for stress or anxiety 9)  Prilosec 40 Mg Cpdr (Omeprazole) .Marland Kitchen.. 1 two times a day for severe gerd 10)  Vitamin D [address] Unit Caps (Ergocalciferol) .Marland Kitchen.. 1 by mouth weekly xzx 12 weeks 11)  Cyanocobalamin 1000 Mcg/ml Soln (Cyanocobalamin) .... Inject monthly 12)  Bd Disp Needle 25g X 1" Misc (Needle (disp)) 13)  Zyrtec Allergy 10 Mg Tabs (Cetirizine hcl) .Marland Kitchen.. 1 qd 14)  Valtrex 500 Mg Tabs (Valacyclovir hcl) .Marland Kitchen.. 1 three times a day for viral infection 15)  Tramadol Hcl 50 Mg Tabs (Tramadol hcl) .Marland Kitchen.. 1-2 q4h as needed  for pain 16)   Diazepam 5 Mg Tabs (Diazepam) .Marland Kitchen.. 1 three times a day as needed stress 17)  Fioricet 50-325-40 Mg Tabs (Butalbital-apap-caffeine) .Marland Kitchen.. 1-2 q4h as needed tension headache , not over 4 per day 18)  Actonel 150 Mg Tabs (Risedronate sodium) .Marland Kitchen.. 1 monthly  for osteoporosis 19)  Ciprofloxacin Hcl 500 Mg Tabs (Ciprofloxacin hcl) .Marland Kitchen.. 1 two times a day for infected throat and boil 20)  Flexeril 10 Mg Tabs (Cyclobenzaprine hcl) .Marland Kitchen.. 1 morn midafternoon and hs for musclespasm, take only 1,2 if dizzineaa  Patient Instructions: 1)  acute muscle spasm 2)  flexeril 1 three times a day  3)  cipro 500mg  two times a day 4)   for pharyngitis and boil on neck 5)  good fluid intake 6)  for bruising, leave off ASA for 2 weeks 7)  buy Vitamin C 500mg  1 AM and PM for at least 1 month, repeat if necessary Prescriptions: TRAMADOL HCL 50 MG TABS (TRAMADOL HCL) 1-2 q4h as needed  for pain  #100 x 5   Entered and Authorized by:   [name] MD   Signed by:   [name] MD on 06/12/2009   Method used:   Electronically to        [facility]. [phone]* (retail)       [phone]  [place], [place]  [address]       Ph: [phone]       Fax: [phone]   RxID:   [id number] FLEXERIL 10 MG TABS (CYCLOBENZAPRINE HCL) 1 morn midafternoon and hs for musclespasm, take only 1,2 if dizzineaa  #50 x 3   Entered and Authorized by:   [name] MD   Signed by:   [name] MD on 06/12/2009   Method used:   Electronically to        [facility]. [phone]* (retail)       [address]       [place], [place]  [address]       Ph: [phone]       Fax: [phone]   RxID:   [id number] CIPROFLOXACIN HCL 500 MG TABS (CIPROFLOXACIN HCL) 1 two times a day for infected throat and boil  #30 x 0   Entered and Authorized by:   [name] MD   Signed by:   [name] MD on 06/12/2009   Method  used:   Electronically to        [facility]. [phone]* (retail)       [address]       [place], [place]  [address]       Ph: [phone]       Fax: [phone]   RxID:   [id number]

## 2010-02-18 NOTE — Progress Notes (Signed)
Summary: REQ FOR RESULTS  Phone Note Call from Patient   Caller: Patient @ [phone] Reason for Call: Talk to Nurse Summary of Call: Pt called in for results of Mammogram and Bone Density test done at the beginning of December but has not heard the results of either...Marland KitchenMarland KitchenPt adv they told her that the results of the Bone Density would be available within 2 wks but pt has not received results.... Pt req a return call @ [phone].  Initial call taken by: Debbra Riding,  January 21, 2009 2:23 PM  Follow-up for Phone Call        Results from Bone Density Test received via fax.... same placed in Dr Laurita Quint folder for delivery. Follow-up by: Debbra Riding,  January 21, 2009 3:56 PM  Additional Follow-up for Phone Call Additional follow up Details #1::        discussed with pt Additional Follow-up by: [name] MD,  January 22, 2009 9:05 AM

## 2010-02-18 NOTE — Medication Information (Signed)
Summary: Coverage Approval for Ambien  Coverage Approval for Ambien   Imported By: Maryln Gottron 04/17/2009 13:22:23  _____________________________________________________________________  External Attachment:    Type:   Image     Comment:   External Document

## 2010-02-18 NOTE — Assessment & Plan Note (Signed)
Summary: neck discomfort/njr   Vital Signs:  Patient profile:   60 year old female Menstrual status:  postmenopausal Weight:      156.5 pounds O2 Sat:      97 % Temp:     98.7 degrees F Pulse rate:   102 / minute BP sitting:   130 / 94  (left arm)  Vitals Entered By: Pura Spice, RN (April 18, 2009 2:15 PM) CC: neck discomfort dizzy with standing get off balance stated had hives now resolved on one side body   History of Present Illness: This 60 year old white married female is in today complaining of pain in her lower back as well as some unsteady gait periodically next is comfort have been relieved with ibuprofen at times. She has had a chronic history of episodes of dizziness and notes at times when she gets dizzy she does have some unsteady gait or gets off balance. This is not a persistent problem In the past month the patient states she fell she had house on one side of her body but this has cleared Approximate 2 months ago she began having pain over the generalized abdomen but especially epigastrium after eating and also over the lower abdomen associated with some constipation. In association with the neck pain she at times has pain in the left arm and shoulder. One of her primary complaint today is the fact that she is depressed and more depressed and she has been in the past. She does not want to go out she desires to stay in the beta and does not like to go to work. Since she had been so depressed she has also had problems with insomnia  Allergies: 1)  ! Pcn 2)  ! Reglan 3)  Amoxicillin (Amoxicillin) 4)  Metoclopramide Hcl (Metoclopramide Hcl) 5)  Sulfamethoxazole (Sulfamethoxazole)  Past History:  Past Medical History: Last updated: 09/09/2006 Menopause GERD Urinary incontinence Migraines  Past Surgical History: Last updated: 09/09/2006 Cholecystectomy  Social History: Last updated: 09/09/2006 Married Never Smoked Alcohol use-no Drug use-no Regular  exercise-no  Risk Factors: Smoking Status: never (09/09/2006)  Review of Systems      See HPI General:  See HPI; Complains of fatigue and sleep disorder. Eyes:  Denies blurring, discharge, double vision, eye irritation, eye pain, halos, itching, light sensitivity, red eye, vision loss-1 eye, and vision loss-both eyes. ENT:  Denies decreased hearing, difficulty swallowing, ear discharge, earache, hoarseness, nasal congestion, nosebleeds, postnasal drainage, ringing in ears, sinus pressure, and sore throat. CV:  Denies bluish discoloration of lips or nails, chest pain or discomfort, difficulty breathing at night, difficulty breathing while lying down, fainting, fatigue, leg cramps with exertion, lightheadness, near fainting, palpitations, shortness of breath with exertion, swelling of feet, swelling of hands, and weight gain. Resp:  Denies chest discomfort, chest pain with inspiration, cough, coughing up blood, excessive snoring, hypersomnolence, morning headaches, pleuritic, shortness of breath, sputum productive, and wheezing. GI:  Complains of abdominal pain, change in bowel habits, constipation, and indigestion. GU:  Denies abnormal vaginal bleeding, decreased libido, discharge, dysuria, genital sores, hematuria, incontinence, nocturia, urinary frequency, and urinary hesitancy. MS:  Complains of joint pain. Derm:  See HPI; Complains of rash; urticaria but now better. Neuro:  Denies brief paralysis, difficulty with concentration, disturbances in coordination, falling down, headaches, inability to speak, memory loss, numbness, poor balance, seizures, sensation of room spinning, tingling, tremors, visual disturbances, and weakness. Psych:  Complains of anxiety, depression, easily angered, and easily tearful.  Physical Exam  General:  Well-developed,well-nourished,in no acute distress; alert,appropriate and cooperative throughout examination Head:  Normocephalic and atraumatic without obvious  abnormalities. No apparent alopecia or balding. Eyes:  No corneal or conjunctival inflammation noted. EOMI. Perrla. Funduscopic exam benign, without hemorrhages, exudates or papilledema. Vision grossly normal. Ears:  External ear exam shows no significant lesions or deformities.  Otoscopic examination reveals clear canals, tympanic membranes are intact bilaterally without bulging, retraction, inflammation or discharge. Hearing is grossly normal bilaterally. Nose:  External nasal examination shows no deformity or inflammation. Nasal mucosa are pink and moist without lesions or exudates. Mouth:  Oral mucosa and oropharynx without lesions or exudates.  Teeth in good repair. Neck:  tenderness left cervical spine C2 through 7 Chest Wall:  No deformities, masses, or tenderness noted. Breasts:  No mass, nodules, thickening, tenderness, bulging, retraction, inflamation, nipple discharge or skin changes noted.   Lungs:  Normal respiratory effort, chest expands symmetrically. Lungs are clear to auscultation, no crackles or wheezes. Heart:  Normal rate and regular rhythm. S1 and S2 normal without gallop, murmur, click, rub or other extra sounds. Abdomen:  epigastric tenderness with increased bowel sounds no mass palpable LS and K. negative Rectal:  not examined Genitalia:  not examined Msk:  some tenderness over the left shoulder all movement but no point tenderness Pulses:  R and L carotid,radial,femoral,dorsalis pedis and posterior tibial pulses are full and equal bilaterally Extremities:  No clubbing, cyanosis, edema, or deformity noted with normal full range of motion of all joints.   Neurologic:  No cranial nerve deficits noted. Station and gait are normal. Plantar reflexes are down-going bilaterally. DTRs are symmetrical throughout. Sensory, motor and coordinative functions appear intact.   Impression & Recommendations:  Problem # 1:  OSTEOPENIA (ICD-733.90) Assessment Unchanged  Her updated  medication list for this problem includes:    Vitamin D [address] Unit Caps (Ergocalciferol) .Marland Kitchen... 1 by mouth weekly xzx 12 weeks    Actonel 150 Mg Tabs (Risedronate sodium) .Marland Kitchen... 1 monthly  for osteoporosis  Problem # 2:  SINUS TACHYCARDIA (ICD-427.89) Assessment: Improved  Problem # 3:  JOINT PAIN, HAND (ICD-719.44) Assessment: Unchanged ibuprofen 800 mg t.i.d.  Problem # 4:  VITAMIN B12 DEFICIENCY (ICD-266.2) Assessment: Improved received 1000 micrograms IM  Problem # 5:  ANXIETY DEPRESSION (ICD-300.4) Assessment: Deteriorated head Cymbalta 30 mg daily x1 week then increase to 60 mg number continue Wellbutrin and Celexa  Problem # 6:  IRRITABLE BOWEL SYNDROME (ICD-564.1) Assessment: Unchanged  Problem # 7:  TENSION HEADACHE (ICD-307.81) Assessment: Unchanged  Her updated medication list for this problem includes:    Imitrex 100 Mg Tabs (Sumatriptan succinate) .Marland Kitchen... Take 1 at onset headache and may repeat x 1    Isometheptene-apap-dichloral 65-325-100 Mg Caps (Apap-isometheptene-dichloral) .Marland Kitchen... Take 1 capsule by mouth every 8 hrs as needed headache.    Tramadol Hcl 50 Mg Tabs (Tramadol hcl) .Marland Kitchen... 1-2 q4h as needed  for pain    Fioricet 50-325-40 Mg Tabs (Butalbital-apap-caffeine) .Marland Kitchen... 1-2 q4h as needed tension headache , not over 4 per day  Problem # 8:  GERD (ICD-530.81) Assessment: Deteriorated  Her updated medication list for this problem includes:    Prilosec 40 Mg Cpdr (Omeprazole) .Marland Kitchen... 1 two times a day for severe gerd 4 short time of change to Nexium 40 mg b.i.d. until samples weregoneand then go back to Prozac  Complete Medication List: 1)  Prempro 0.3-1.5 Mg Tabs (Conj estrog-medroxyprogest ace) .Marland Kitchen.. 1 qd 2)  Wellbutrin Xl 300 Mg Tb24 (Bupropion hcl) .... Once daily 3)  Imitrex 100 Mg Tabs (Sumatriptan succinate) .... Take 1 at onset headache and may repeat x 1 4)  Ondansetron Hcl 4 Mg Tabs (Ondansetron hcl) .Marland Kitchen.. 1-2 q4-6 h as needed nausea 5)   Isometheptene-apap-dichloral 65-325-100 Mg Caps (Apap-isometheptene-dichloral) .... Take 1 capsule by mouth every 8 hrs as needed headache. 6)  Zolpidem Tartrate 10 Mg Tabs (Zolpidem tartrate) .Marland Kitchen.. 1 by mouth hs as needed sleep 7)  Celexa 40 Mg Tabs (Citalopram hydrobromide) .Marland Kitchen.. 1 once daily 8)  Alprazolam 0.5 Mg Tabs (Alprazolam) .Marland Kitchen.. 1 morn midafternoon and hs for stress or anxiety 9)  Prilosec 40 Mg Cpdr (Omeprazole) .Marland Kitchen.. 1 two times a day for severe gerd 10)  Vitamin D [address]40 Unit Caps (Ergocalciferol) .Marland Kitchen.. 1 by mouth weekly xzx 12 weeks 11)  Cyanocobalamin 1000 Mcg/ml Soln (Cyanocobalamin) .... Inject monthly 12)  Bd Disp Needle 25g X 1" Misc (Needle (disp)) 13)  Zyrtec Allergy 10 Mg Tabs (Cetirizine hcl) .Marland Kitchen.. 1 qd 14)  Valtrex 500 Mg Tabs (Valacyclovir hcl) .Marland Kitchen.. 1 three times a day for viral infection 15)  Tramadol Hcl 50 Mg Tabs (Tramadol hcl) .Marland Kitchen.. 1-2 q4h as needed  for pain 16)  Diazepam 5 Mg Tabs (Diazepam) .Marland Kitchen.. 1 three times a day as needed stress 17)  Fioricet 50-325-40 Mg Tabs (Butalbital-apap-caffeine) .Marland Kitchen.. 1-2 q4h as needed tension headache , not over 4 per day 18)  Actonel 150 Mg Tabs (Risedronate sodium) .Marland Kitchen.. 1 monthly  for osteoporosis  Patient Instructions: 1)  for depression, chronic, continue welbutrin, celexa and start cymbalta 30 mg each daayb1 week then 60 mg each day 2)  take Nexium 40 mg two times a day 3)  take ibuprofen 800 mg three times a day 4)  after meals 5)  check with [place] regarding [employer] [facility]

## 2010-02-18 NOTE — Miscellaneous (Signed)
Summary: Orders Update  Clinical Lists Changes  Problems: Added new problem of CAROTID ARTERY DISEASE (ICD-433.10) Orders: Added new Test order of Carotid Duplex (Carotid Duplex) - Signed 

## 2010-02-19 ENCOUNTER — Telehealth: Payer: Self-pay | Admitting: *Deleted

## 2010-02-19 NOTE — Telephone Encounter (Signed)
Agree. 911 to ED for evaluation

## 2010-02-19 NOTE — Telephone Encounter (Signed)
Pt states she has been having extreme weakness, and rapid heart rate , BP 180/145           Pulse 155 Also having SOB, no chest pain.  Advised ER ASAP.

## 2010-02-20 NOTE — Progress Notes (Signed)
Summary: doc please call  Phone Note Call from Patient Call back at Home Phone [phone]   Caller: Patient Call For: [name] MD Summary of Call: pt would like doc to call her concering carotid test Initial call taken by: Heron Sabins,  January 28, 2010 12:50 PM  Follow-up for Phone Call        Pt would like to speak Dr. Scotty Court personally about her doppler results. Follow-up by: Romualdo Bolk, CMA Duncan Dull),  January 28, 2010 2:57 PM  Additional Follow-up for Phone Call Additional follow up Details #1::        Per Dr Scotty Court already taken care of Additional Follow-up by: Alfred Levins, CMA,  January 31, 2010 4:17 PM

## 2010-02-26 NOTE — Progress Notes (Signed)
Summary: Pt has no energy,sore glands,high bp,wants call from dr.  Phone Note Call from Patient Call back at Home Phone [phone] Call back at [phone] cell   Caller: Patient Summary of Call: Pt called and said that she is having weakness,no energy, glands hurting. Pt says that her bp was 150/90 and hr was 140.   Pt didn't know if she should make ov with Dr. Scotty Court or what she should do? Initial call taken by: Lucy Antigua,  February 12, 2010 3:26 PM  Follow-up for Phone Call        Pt cannot come in to the office right now, her grandson is sick and she is going to school to get him , and will call back for an appt. Follow-up by: Lynann Beaver CMA AAMA,  February 17, 2010 11:22 AM  Additional Follow-up for Phone Call Additional follow up Details #1::        patient called today and was referred to [facility] Additional Follow-up by: [name] MD,  February 19, 2010 1:34 PM

## 2010-03-05 ENCOUNTER — Telehealth: Payer: Self-pay | Admitting: Family Medicine

## 2010-03-05 NOTE — Telephone Encounter (Signed)
Pt called to check status of getting prior auth of  Zolpidem 10mg  Pt uses [facility]. I told pt that req had been rcvd by [facility], but PA form hasn't been rcvd by insurance co yet. Pt would like to be contacted when PA has been rcvd.

## 2010-03-13 ENCOUNTER — Ambulatory Visit (INDEPENDENT_AMBULATORY_CARE_PROVIDER_SITE_OTHER): Payer: Federal, State, Local not specified - PPO | Admitting: Family Medicine

## 2010-03-13 ENCOUNTER — Encounter: Payer: Self-pay | Admitting: Family Medicine

## 2010-03-13 DIAGNOSIS — R51 Headache: Secondary | ICD-10-CM

## 2010-03-13 DIAGNOSIS — E538 Deficiency of other specified B group vitamins: Secondary | ICD-10-CM

## 2010-03-13 DIAGNOSIS — IMO0001 Reserved for inherently not codable concepts without codable children: Secondary | ICD-10-CM

## 2010-03-13 DIAGNOSIS — K589 Irritable bowel syndrome without diarrhea: Secondary | ICD-10-CM

## 2010-03-13 DIAGNOSIS — K219 Gastro-esophageal reflux disease without esophagitis: Secondary | ICD-10-CM

## 2010-03-13 DIAGNOSIS — M797 Fibromyalgia: Secondary | ICD-10-CM

## 2010-03-13 DIAGNOSIS — E559 Vitamin D deficiency, unspecified: Secondary | ICD-10-CM

## 2010-03-13 MED ORDER — ATENOLOL 25 MG PO TABS: 25.0000 mg | ORAL_TABLET | Freq: Every day | ORAL | Status: DC

## 2010-03-13 MED ORDER — ALPRAZOLAM 0.5 MG PO TABS: 0.5000 mg | ORAL_TABLET | Freq: Three times a day (TID) | ORAL | Status: DC | PRN

## 2010-03-13 MED ORDER — BUTALBITAL-APAP-CAFF-COD 50-325-40-30 MG PO CAPS: 1.0000 | ORAL_CAPSULE | ORAL | Status: DC | PRN

## 2010-03-13 MED ORDER — TRETINOIN MICROSPHERE 0.04 % EX GEL: Freq: Every day | CUTANEOUS | Status: AC

## 2010-03-13 MED ORDER — VALACYCLOVIR HCL 500 MG PO TABS: 500.0000 mg | ORAL_TABLET | Freq: Three times a day (TID) | ORAL | Status: DC

## 2010-03-13 MED ORDER — CYANOCOBALAMIN 1000 MCG/ML IJ SOLN: 1000.0000 ug | INTRAMUSCULAR | Status: DC

## 2010-03-13 MED ORDER — CYCLOBENZAPRINE HCL 10 MG PO TABS: 10.0000 mg | ORAL_TABLET | Freq: Three times a day (TID) | ORAL | Status: DC | PRN

## 2010-03-15 NOTE — Progress Notes (Signed)
  Subjective:    Patient ID: Mandy Diaz, female    DOB: 14-May-1950, 60 y.o.   MRN: [id number] This 60 year old white married female is in today to discuss her more multiple medical problems. She relates she was admitted to [facility] in January with tachycardia as well as a drop in blood pressure. She had general LAM symptoms will await prior to being admitted to [facility]. She had a complete cardiac workup which was all old and noted through explanation for her problem. However she did improve and has been doing fine since she is in today to get refill of her medications. She continued to have tension as well as migraine headaches and needs medication. Interval bowel syndrome as on the good control at this time but needs her medication. She continued to have muscle aches and pains as well as arthritic pains and the myositis is controlled with Cymbalta 30 mg each day. She needs a refill on her anti-nausea medicine because was having a migraine she has problem with nausea vomiting needs oral and well as suppositories for treatmentCardiac rate has been normal with no recent problemHPI    Review of Systemssee H&P for summary of her review of systems. She has no new complaints at this time     Objective:   Physical Exam HEENT negative carotid pulses good thyroid is normal lungs are clear breasts not examined heart normal size regular rhythm no murmurs peripheral pulses are good Abdomen liver spleen kidney nonpalpable no masses felt No lymphadenopathy she mentioned what her sugar will       Assessment & Plan:  2 continue regular medication for her multiple problems as well as check a vitamin D and vitamin B12 level today refill medications

## 2010-03-15 NOTE — Patient Instructions (Signed)
It appears that you are doing very well at this time. Will check vitamin D and vitamin B12 level. Will refill her medications and also will call you results of lab Return if necessary Will obtain Cymbalta samples

## 2010-03-19 ENCOUNTER — Other Ambulatory Visit: Payer: Self-pay | Admitting: Family Medicine

## 2010-03-19 MED ORDER — ALPRAZOLAM 0.5 MG PO TABS: 0.5000 mg | ORAL_TABLET | Freq: Three times a day (TID) | ORAL | Status: DC | PRN

## 2010-03-19 MED ORDER — VITAMIN D3 1.25 MG (50000 UT) PO CAPS: 1.0000 | ORAL_CAPSULE | ORAL | Status: DC

## 2010-04-03 ENCOUNTER — Other Ambulatory Visit: Payer: Self-pay | Admitting: Family Medicine

## 2010-04-07 ENCOUNTER — Other Ambulatory Visit: Payer: Self-pay

## 2010-04-07 MED ORDER — ZOLPIDEM TARTRATE 10 MG PO TABS: 10.0000 mg | ORAL_TABLET | Freq: Every evening | ORAL | Status: DC | PRN

## 2010-04-07 NOTE — Telephone Encounter (Signed)
rx ambien called to [facility]

## 2010-04-08 ENCOUNTER — Other Ambulatory Visit: Payer: Self-pay | Admitting: Family Medicine

## 2010-04-08 NOTE — Telephone Encounter (Signed)
done

## 2010-05-10 ENCOUNTER — Other Ambulatory Visit: Payer: Self-pay | Admitting: Family Medicine

## 2010-05-13 ENCOUNTER — Other Ambulatory Visit: Payer: Self-pay | Admitting: Family Medicine

## 2010-05-13 MED ORDER — DULOXETINE HCL 30 MG PO CPEP: ORAL_CAPSULE | ORAL | Status: DC

## 2010-05-13 NOTE — Telephone Encounter (Signed)
Dr. Scotty Court received the message and said to send an rx to [facility] for Cymbalta 30 mg qd for fibromyalgia.

## 2010-05-13 NOTE — Telephone Encounter (Signed)
Pt called and said that Dr Scotty Court gave pt samples for Cymbalta 30mg  until pts insuarance went through. Pt says that her insurance needs to know that this is not being prescribed for depression, but it is for pts Fibromyalgia. Pt said that Dr Scotty Court was suppose to do a script for this med and send it in to [facility] on [address] [phone]

## 2010-06-06 NOTE — Discharge Summary (Signed)
NAME:  Mandy Diaz, Mandy Diaz                            ACCOUNT NO.:  [id number]   MEDICAL RECORD NO.:  [id number]                   PATIENT TYPE:  INP   LOCATION:  3708                                 FACILITY:  [facility]   PHYSICIAN:  Charlies Constable, M.D. [facility]              DATE OF BIRTH:  01-12-1951   DATE OF ADMISSION:  06/26/2003  DATE OF DISCHARGE:  06/27/2003                                 DISCHARGE SUMMARY   PROCEDURES:  None.   HOSPITAL COURSE:  Mandy Diaz is a 60 year old female with no known history of  coronary artery disease.  She has a history of mitral valve prolapse and she  had a Holter monitor 20 years ago for tachycardia.  She had onset of 8/10  chest pressure that radiated to her right shoulder and was associated with  diaphoresis.  She took a baby aspirin and a clonazepam and received relief  in about 45 minutes.  She had some intermittent chest pains and was admitted  for further evaluation and treatment.   The next day, Mandy Diaz enzymes were negative for MI.  She was evaluated by  Dr. Juanda Chance who felt her risk factor profile was well enough that she could  be evaluated with a Cardiolite as an outpatient.  She is to follow up with  an echocardiogram as well.  She is to see Dr. Juanda Chance in the office after the  testing.  She was considered stable for discharge on June 27, 2003, with  outpatient followup arranged.   CHEST X-RAY:  No acute disease.   LABORATORY VALUES:  Hemoglobin 13.4, hematocrit 40.1, WBC 6.6, platelets  308.  Sodium 142, potassium 3.7, chloride 106, CO2 28, BUN 9, creatinine  0.8, glucose 108.  Serial CK-MB and troponin I negative for MI.  TSH 2.858.  Lipids pending at the time of dictation and are incomplete.   DISCHARGE CONDITION:  Improved.   DISCHARGE DIAGNOSES:  1. Chest pain, negative myocardial infarction by enzymes and evaluation for     angina with Cardiolite as an outpatient.  2. Status post tachy palpitations, evaluated by Holter monitor greater  than     10 years ago.  3. History of mitral valve prolapse.  4. 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  5. History of scarlet fever.  6. History of migraines.  7. History of hyperactive gastrointestinal tract.  8. Family history of heart disease.  9. Allergy or intolerance to penicillin, sulfa, and Reglan.   DISCHARGE INSTRUCTIONS:  Her activity level is to include no strenuous  activity until after the stress test.  She is to be at the office on June 13  at 1:00 p.m. for stress test.  She is to see the PA for Dr. Juanda Chance on June  20 and get an echo at that time as well.   DISCHARGE MEDICATIONS:  1. Toprol-XL 25 mg q.d.  2. Aspirin 81 mg  q.d.  3. Lexapro 20 mg q.d.  4. Librax q.h.s.  5. Nexium 40 mg q.d.  6. Prempro.  7. Wellbutrin 300 mg q.d.      Theodore Demark, P.A. [facility]                  Charlies Constable, M.D. [facility]    RB/MEDQ  D:  06/27/2003  T:  06/28/2003  Job:  [id number]   cc:   Ellin Saba., M.D.  [address] [place]  [place] [address]  Fax: [phone]   Charlies Constable, M.D. [facility]

## 2010-06-06 NOTE — H&P (Signed)
NAME:  Diaz, [name]                            ACCOUNT NO.:  [id number]   MEDICAL RECORD NO.:  [id number]                   PATIENT TYPE:  INP   LOCATION:  1823                                 FACILITY:  [facility]   PHYSICIAN:  Charlies Constable, M.D. [facility]              DATE OF BIRTH:  [birth year]   DATE OF ADMISSION:  06/26/2003  DATE OF DISCHARGE:                                HISTORY & PHYSICAL   PRIMARY PHYSICIAN:  Dr. Tawny Asal.   CHIEF COMPLAINT:  Chest pain.   CLINICAL HISTORY:  Mandy Diaz is 60 years old and has a history of mitral  valve prolapse dating back 20 years ago but no prior history of known  coronary disease.  Sunday, after rushing around, she developed sharp  substernal chest pain  with feeling of pressure and radiation into her right  shoulder associated with some sweating.  She took aspirin and a tranquilizer  and her symptoms resolved after about 45 minutes.  Since that time, she has  had intermittent twinges in her lower sternal region and she went to see Dr.  Scotty Court today, who evaluated her and arranged for her to come to [facility]  by ambulance.   She has had a history of shortness of breath with exertion and limited  exercise tolerance for 20 years and she had the diagnosis of mitral valve  prolapse.  This was based on an echocardiogram about 20 years ago.  SBE  prophylaxis was recommended but she says she never was on a beta blocker.  She did have a Holter monitor done as part of that evaluation 20 years ago.   PAST MEDICAL HISTORY:  Her past medical history is significant for history  of [facility] spotted fever and scarlet fever and she has a history of  migraine and a hyperactive GI tract.   ALLERGIES:  She is allergic to PENICILLIN, SULFA and REGLAN.   CURRENT MEDICATIONS:  Her current medications include:  1. Lexapro 20 mg a day.  2. Librax nightly.  3. Nexium 40 mg daily.  4. Prempro.  5. Wellbutrin 300 mg daily.   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  She has 1 son and 1 daughter.  She does not smoke.   FAMILY HISTORY:  She has a brother who is cared for by Dr. Arturo Morton.  Riley Kill, who is 59 years old and has had 2 myocardial infarctions.  She also  has a mother who has had a stroke and has had carotid stents.   For details of family history, social history and review of systems, please  see Dawn Watt's complete note.   PHYSICAL EXAMINATION:  VITAL SIGNS:  On examination, blood pressure 158/87  and a pulse of 92 and regular.  NECK:  There was no venous distention.  The carotid pulses were full without  bruits.  CHEST:  Chest was clear without rales or rhonchi.  HEART:  The heart rate is regular.  There are no murmurs or gallops and I  could not hear any definite click.  ABDOMEN:  The abdomen was soft with normal bowel sounds.  There was some  mild tenderness in the left lower quadrant.  EXTREMITIES:  Extremities show good pulses and there is no peripheral edema.  MUSCULOSKELETAL:  Musculoskeletal showed no deformities.  NEUROLOGICAL:  Examination showed no focal neurological signs.  SKIN:  Skin is warm and dry.   LABORATORY AND ACCESSORY CLINICAL DATA:  Chest x-ray shows no active  disease.   An EKG showed very minimal nonspecific ST-T changes.   IMPRESSION:  1. Chest pain somewhat atypical for ischemia.  2. History of mitral valve prolapse.  3. History of hypertension but currently on no medications.  4. Positive family history for coronary heart disease.   RECOMMENDATIONS:  We will plan to admit Mrs. Perdue for observation.  If she  has no recurrent symptoms and if her subsequent EKGs and enzymes are  negative, we will plan to evaluate her as an outpatient with a rest stress  Cardiolite scan and echocardiogram.  We will start her on aspirin and beta  blocker but hold off on heparin unless she has recurrent symptoms.                                                 Charlies Constable, M.D. [facility]    BB/MEDQ  D:  06/26/2003  T:  06/27/2003  Job:  [id number]

## 2010-06-19 ENCOUNTER — Other Ambulatory Visit: Payer: Self-pay | Admitting: Family Medicine

## 2010-06-19 NOTE — Telephone Encounter (Signed)
Refill for 1 yr.

## 2010-07-16 ENCOUNTER — Encounter: Payer: Self-pay | Admitting: Family Medicine

## 2010-07-16 ENCOUNTER — Ambulatory Visit
Admission: RE | Admit: 2010-07-16 | Discharge: 2010-07-16 | Disposition: A | Discharge status: Home / Self Care | Payer: Federal, State, Local not specified - PPO | Source: Ambulatory Visit | Attending: Family Medicine | Admitting: Family Medicine

## 2010-07-16 ENCOUNTER — Inpatient Hospital Stay (INDEPENDENT_AMBULATORY_CARE_PROVIDER_SITE_OTHER)
Admission: RE | Admit: 2010-07-16 | Discharge: 2010-07-16 | Disposition: A | Discharge status: Home / Self Care | Payer: Federal, State, Local not specified - PPO | Source: Ambulatory Visit | Attending: Family Medicine | Admitting: Family Medicine

## 2010-07-16 ENCOUNTER — Other Ambulatory Visit: Payer: Self-pay | Admitting: Family Medicine

## 2010-07-16 DIAGNOSIS — S91309A Unspecified open wound, unspecified foot, initial encounter: Secondary | ICD-10-CM

## 2010-07-16 DIAGNOSIS — Z23 Encounter for immunization: Secondary | ICD-10-CM

## 2010-07-18 ENCOUNTER — Telehealth (INDEPENDENT_AMBULATORY_CARE_PROVIDER_SITE_OTHER): Payer: Self-pay | Admitting: Emergency Medicine

## 2010-07-31 ENCOUNTER — Encounter: Payer: Self-pay | Admitting: Internal Medicine

## 2010-07-31 ENCOUNTER — Ambulatory Visit (INDEPENDENT_AMBULATORY_CARE_PROVIDER_SITE_OTHER): Payer: Federal, State, Local not specified - PPO | Admitting: Internal Medicine

## 2010-07-31 VITALS — BP 130/90 | Temp 98.6°F | Wt 172.0 lb

## 2010-07-31 DIAGNOSIS — IMO0002 Reserved for concepts with insufficient information to code with codable children: Secondary | ICD-10-CM

## 2010-07-31 DIAGNOSIS — S90851A Superficial foreign body, right foot, initial encounter: Secondary | ICD-10-CM

## 2010-07-31 NOTE — Progress Notes (Signed)
  Subjective:    Patient ID: Mandy Diaz, female    DOB: 1950/01/26, 60 y.o.   MRN: [id number]  HPI  60 year old patient who injured her right foot 3 weeks ago. She stepped on her wooden deck at that time and suffered a puncture wound involving the plantar aspect of her mid forefoot. She was seen at an urgent care 2 weeks ago but there is no obvious foreign body at that time. The past 3 weeks she has had increasing pain and soft tissue swelling. She's had a very difficult time bearing weight on the area due to the worsening pain   Review of Systems  Musculoskeletal: Positive for gait problem.  Skin: Positive for wound.       Objective:   Physical Exam  Musculoskeletal:       Soft tissue swelling and erythema noted involving the plantar surface of the foot near the base of the third toe. This area was anesthetized with 1% Xylocaine. An I&D was performed with removal of a large wooden splinter          Assessment & Plan:   Status post I&D foreign body right plantar foot  Local wound care discussed

## 2010-07-31 NOTE — Patient Instructions (Signed)
Keep area clean and dry Soak the right foot  twice daily  Call if worsening pain redness or drainage

## 2010-09-04 ENCOUNTER — Other Ambulatory Visit: Payer: Self-pay | Admitting: Family Medicine

## 2010-09-09 ENCOUNTER — Other Ambulatory Visit (INDEPENDENT_AMBULATORY_CARE_PROVIDER_SITE_OTHER): Payer: Federal, State, Local not specified - PPO

## 2010-09-09 ENCOUNTER — Telehealth: Payer: Self-pay | Admitting: Family Medicine

## 2010-09-09 DIAGNOSIS — Z0000 Encounter for general adult medical examination without abnormal findings: Secondary | ICD-10-CM

## 2010-09-09 LAB — CBC WITH DIFFERENTIAL/PLATELET
Eosinophils Relative: 1.2 % (ref 0.0–5.0)
HCT: 36.8 % (ref 36.0–46.0)
Hemoglobin: 12.4 g/dL (ref 12.0–15.0)
Lymphs Abs: 1.9 10*3/uL (ref 0.7–4.0)
MCV: 84.9 fl (ref 78.0–100.0)
Monocytes Absolute: 0.3 10*3/uL (ref 0.1–1.0)
Monocytes Relative: 5.1 % (ref 3.0–12.0)
Neutro Abs: 3.2 10*3/uL (ref 1.4–7.7)
Platelets: 263 10*3/uL (ref 150.0–400.0)
RDW: 15.1 % — ABNORMAL HIGH (ref 11.5–14.6)
WBC: 5.6 10*3/uL (ref 4.5–10.5)

## 2010-09-09 LAB — BASIC METABOLIC PANEL
BUN: 14 mg/dL (ref 6–23)
Chloride: 106 mEq/L (ref 96–112)
GFR: 79.13 mL/min (ref >60.00)
Glucose, Bld: 88 mg/dL (ref 70–99)
Potassium: 3.9 mEq/L (ref 3.5–5.1)
Sodium: 141 mEq/L (ref 135–145)

## 2010-09-09 LAB — POCT URINALYSIS DIPSTICK
Bilirubin, UA: NEGATIVE
Glucose, UA: NEGATIVE
Ketones, UA: NEGATIVE
Spec Grav, UA: 1.015

## 2010-09-09 LAB — VITAMIN B12: Vitamin B-12: 477 pg/mL (ref 211–911)

## 2010-09-09 LAB — HEPATIC FUNCTION PANEL
AST: 20 U/L (ref 0–37)
Alkaline Phosphatase: 56 U/L (ref 39–117)
Bilirubin, Direct: 0 mg/dL (ref 0.0–0.3)

## 2010-09-09 LAB — LIPID PANEL
Total CHOL/HDL Ratio: 4
VLDL: 28.4 mg/dL (ref 0.0–40.0)

## 2010-09-09 LAB — LDL CHOLESTEROL, DIRECT: Direct LDL: 169.1 mg/dL

## 2010-09-09 NOTE — Telephone Encounter (Signed)
Pt would like full thyroid check done from the blood work that was taken this morning. Please contact.

## 2010-09-09 NOTE — Telephone Encounter (Signed)
Called and spoke with pt and she is aware a TSH test was drawn but pt would like to have free t3 and free t4 drawn as well because she is having some issues and has a family history.

## 2010-09-10 LAB — VITAMIN D 25 HYDROXY (VIT D DEFICIENCY, FRACTURES): Vit D, 25-Hydroxy: 20 ng/mL — ABNORMAL LOW (ref 30–89)

## 2010-09-10 NOTE — Telephone Encounter (Signed)
Pt will discuss with Dr. Scotty Court tomorrow.

## 2010-09-11 ENCOUNTER — Encounter: Payer: Self-pay | Admitting: Family Medicine

## 2010-09-11 ENCOUNTER — Other Ambulatory Visit (HOSPITAL_COMMUNITY)
Admission: RE | Admit: 2010-09-11 | Discharge: 2010-09-11 | Disposition: A | Discharge status: Home / Self Care | Payer: Federal, State, Local not specified - PPO | Source: Ambulatory Visit | Attending: Family Medicine | Admitting: Family Medicine

## 2010-09-11 ENCOUNTER — Ambulatory Visit (INDEPENDENT_AMBULATORY_CARE_PROVIDER_SITE_OTHER): Payer: Federal, State, Local not specified - PPO | Admitting: Family Medicine

## 2010-09-11 VITALS — BP 120/80 | HR 98 | Temp 98.4°F | Ht 65.5 in | Wt 157.5 lb

## 2010-09-11 DIAGNOSIS — Z0000 Encounter for general adult medical examination without abnormal findings: Secondary | ICD-10-CM

## 2010-09-11 DIAGNOSIS — F329 Major depressive disorder, single episode, unspecified: Secondary | ICD-10-CM

## 2010-09-11 DIAGNOSIS — G44229 Chronic tension-type headache, not intractable: Secondary | ICD-10-CM

## 2010-09-11 DIAGNOSIS — Z01419 Encounter for gynecological examination (general) (routine) without abnormal findings: Secondary | ICD-10-CM | POA: Insufficient documentation

## 2010-09-11 DIAGNOSIS — M797 Fibromyalgia: Secondary | ICD-10-CM

## 2010-09-11 DIAGNOSIS — E0590 Thyrotoxicosis, unspecified without thyrotoxic crisis or storm: Secondary | ICD-10-CM

## 2010-09-11 NOTE — Patient Instructions (Signed)
One of your diagnoses Colles and a good bit of your problem is fibromyalgia. I would like for you to take one Flexeril at bedtime to relieve the muscle spasm also try one tramadol 4 times a day should that bother you then just reduce it to 2-3 as soon as you can resume Cymbalta will be helpful I feel assured Ibuprofen 800 mg twice a day after meals or at this is for costochondritis help prevent the pain in the upper chest as well as the shoulder region I am going to update all your medicines at the pharmacy so that she will have no problem obtaining them Call you the results of the thyroid test Continue to take your regular medicines for your other problem

## 2010-09-12 LAB — T3, FREE: T3, Free: 2.8 pg/mL (ref 2.3–4.2)

## 2010-09-12 LAB — T4, FREE: Free T4: 0.72 ng/dL (ref 0.60–1.60)

## 2010-09-14 ENCOUNTER — Other Ambulatory Visit: Payer: Self-pay | Admitting: Family Medicine

## 2010-09-16 ENCOUNTER — Telehealth: Payer: Self-pay

## 2010-09-16 NOTE — Progress Notes (Deleted)
done

## 2010-09-16 NOTE — Telephone Encounter (Signed)
Message copied by Beverely Low on Tue Sep 16, 2010 10:49 AM ------      Message from: Harvie Heck.      Created: Mon Sep 15, 2010  9:32 AM       T3-T4 normal

## 2010-09-16 NOTE — Telephone Encounter (Signed)
Lab mailed

## 2010-09-25 ENCOUNTER — Other Ambulatory Visit: Payer: Self-pay | Admitting: Family Medicine

## 2010-09-25 ENCOUNTER — Telehealth: Payer: Self-pay

## 2010-09-25 MED ORDER — TRAMADOL HCL 50 MG PO TABS: 50.0000 mg | ORAL_TABLET | Freq: Four times a day (QID) | ORAL | Status: DC

## 2010-09-25 MED ORDER — CYCLOBENZAPRINE HCL 10 MG PO TABS: 10.0000 mg | ORAL_TABLET | Freq: Every day | ORAL | Status: AC

## 2010-09-25 MED ORDER — OMEPRAZOLE 40 MG PO CPDR: 40.0000 mg | DELAYED_RELEASE_CAPSULE | Freq: Two times a day (BID) | ORAL | Status: DC | PRN

## 2010-09-25 MED ORDER — VITAMIN D3 1.25 MG (50000 UT) PO CAPS: 1.0000 | ORAL_CAPSULE | ORAL | Status: DC

## 2010-09-25 MED ORDER — PROMETHAZINE HCL 25 MG PO TABS: 25.0000 mg | ORAL_TABLET | ORAL | Status: DC

## 2010-09-25 NOTE — Telephone Encounter (Signed)
Pt states she understands her lab work is within normal limits but the results are on the lower range.  Pt would like to know if the results could possibly have something to do with her not having any energy and losing her eyebrows and her nails being brittle. Pls advise.

## 2010-09-25 NOTE — Progress Notes (Deleted)
Quick Note:  Pt aware ______ 

## 2010-09-25 NOTE — Telephone Encounter (Signed)
Pt requesting refill on PREMPRO 0.3-1.5 MG (pt is going out of town tomorrow and is hoping to get the refill today)  Please send to [facility] on [address]  Pt was in for cpe recently and was told her refill prescriptions would be sent to the pharmacy and they haven't been pt is concerned that she will have to go through this process everytime she needs a refill. Please contact pt.

## 2010-10-01 NOTE — Telephone Encounter (Signed)
Pt is aware.  

## 2010-10-01 NOTE — Telephone Encounter (Signed)
Per Dr. Scotty Court pt's thyroid is within normal limits; Dr. Scotty Court states pt has a multiitude of health problems i.e. Fibromyalgia and that can make pt tired.  Pt should contact dermatologist about nails and eyebrows.  Eyebrows are a hard problem to diagnosis.

## 2010-11-18 ENCOUNTER — Other Ambulatory Visit: Payer: Self-pay | Admitting: Family Medicine

## 2010-11-18 NOTE — Telephone Encounter (Signed)
Pt last seen 09/11/10. Pls advise.

## 2010-11-18 NOTE — Telephone Encounter (Signed)
Pt called and is req refills for zolpidem (AMBIEN) 10 MG 30 day supply with refills to [facility] on [address] [phone] Pt is completely out of medicine.

## 2010-11-21 NOTE — Telephone Encounter (Signed)
Call in 6 month supply

## 2010-11-21 NOTE — Telephone Encounter (Signed)
duplicate

## 2010-11-24 MED ORDER — ZOLPIDEM TARTRATE 10 MG PO TABS: 10.0000 mg | ORAL_TABLET | Freq: Every evening | ORAL | Status: DC | PRN

## 2010-11-24 NOTE — Telephone Encounter (Signed)
Called the pharmacy and pt's medication had not been called in.  Called in zolpidem 10 mg #30x 5 rf.

## 2010-11-24 NOTE — Telephone Encounter (Signed)
Please call.

## 2010-12-22 NOTE — Letter (Signed)
Summary: Out of Work  [facility]  [address]   [place], [place] [address]   Phone: [phone]  Fax: [phone]    July 16, 2010   Employee:  [name] [employer]ades    To Whom It May Concern:   For Medical reasons, Mandy Diaz should use crutches for one week.   If you need additional information, please feel free to contact our office.         Sincerely,    Donna Christen MD

## 2010-12-22 NOTE — Progress Notes (Signed)
Summary: SPLINTER IN RIGHT FOOT (proced. rm)   Vital Signs:  Patient Profile:   60 Years Old Female CC:      ?wood in right foot from last night Height:     65 inches Weight:      159 pounds O2 Sat:      98 % O2 treatment:    Room Air Temp:     98.8 degrees F oral Pulse rate:   85 / minute Resp:     12 per minute BP sitting:   130 / 75  (left arm) Cuff size:   regular  Pt. in pain?   yes    Location:   right foot  Vitals Entered By: Lajean Saver RN (July 16, 2010 10:24 AM)                   Updated Prior Medication List: PREMPRO 0.3-1.5 MG TABS (CONJ ESTROG-MEDROXYPROGEST ACE) 1 qd WELLBUTRIN XL 300 MG  TB24 (BUPROPION HCL) once daily for depression IMITREX 100 MG  TABS (SUMATRIPTAN SUCCINATE) take 1 at onset headache and may repeat x 1 ISOMETHEPTENE-APAP-DICHLORAL 65-325-100 MG  CAPS (APAP-ISOMETHEPTENE-DICHLORAL) take 1 capsule by mouth every 8 hrs as needed headache. ZOLPIDEM TARTRATE 10 MG TABS (ZOLPIDEM TARTRATE) 1 by mouth hs as needed sleep CELEXA 40 MG TABS (CITALOPRAM HYDROBROMIDE) 1 once daily ALPRAZOLAM 0.5 MG TABS (ALPRAZOLAM) 1 morn midafternoon and hs for stress or anxiety PRILOSEC 40 MG CPDR (OMEPRAZOLE) 1 two times a day for severe gerd VITAMIN D 56213 UNIT CAPS (ERGOCALCIFEROL) 1 by mouth weekly xzx 12 weeks CYANOCOBALAMIN 1000 MCG/ML SOLN (CYANOCOBALAMIN) inject monthly BD DISP NEEDLE 25G X 1" MISC (NEEDLE (DISP))  ZYRTEC ALLERGY 10 MG TABS (CETIRIZINE HCL) 1 qd VALTREX 500 MG TABS (VALACYCLOVIR HCL) 1 three times a day for viral infection DIAZEPAM 5 MG TABS (DIAZEPAM) 1 three times a day as needed stress FIORICET 50-325-40 MG TABS (BUTALBITAL-APAP-CAFFEINE) 1-2 q4h as needed tension headache , not over 4 per day ACTONEL 150 MG TABS (RISEDRONATE SODIUM) 1 monthly  for osteoporosis FLEXERIL 10 MG TABS (CYCLOBENZAPRINE HCL) 1 morn midafternoon and hs for musclespasm, take only 1,2 if dizzineaa PROMETHAZINE HCL 25 MG TABS (PROMETHAZINE HCL) 1-2  q4-6 as needed toprevent nausea or vomiting LIBRAX 2.5-5 MG CAPS (CLIDINIUM-CHLORDIAZEPOXIDE) 1 qid  for irritable bowel  Current Allergies (reviewed today): ! PCN ! REGLAN AMOXICILLIN (AMOXICILLIN) METOCLOPRAMIDE HCL (METOCLOPRAMIDE HCL) SULFAMETHOXAZOLE (SULFAMETHOXAZOLE)History of Present Illness Chief Complaint: ?wood in right foot from last night History of Present Illness:  Subjective:  Patient's right foot was punctured by splinter from her deck last night.  She was able to easily remove splinter but still has pain and wonders if there may be a retained splinter.  REVIEW OF SYSTEMS Constitutional Symptoms      Denies fever, chills, night sweats, weight loss, weight gain, and fatigue.  Eyes       Denies change in vision, eye pain, eye discharge, glasses, contact lenses, and eye surgery. Ear/Nose/Throat/Mouth       Denies hearing loss/aids, change in hearing, ear pain, ear discharge, dizziness, frequent runny nose, frequent nose bleeds, sinus problems, sore throat, hoarseness, and tooth pain or bleeding.  Respiratory       Denies dry cough, productive cough, wheezing, shortness of breath, asthma, bronchitis, and emphysema/COPD.  Cardiovascular       Denies murmurs, chest pain, and tires easily with exhertion.    Gastrointestinal       Denies stomach pain, nausea/vomiting, diarrhea, constipation, blood in  bowel movements, and indigestion. Genitourniary       Denies painful urination, kidney stones, and loss of urinary control. Neurological       Denies paralysis, seizures, and fainting/blackouts. Musculoskeletal       Denies muscle pain, joint pain, joint stiffness, decreased range of motion, redness, swelling, muscle weakness, and gout.  Skin       Denies bruising, unusual mles/lumps or sores, and hair/skin or nail changes.  Psych       Denies mood changes, temper/anger issues, anxiety/stress, speech problems, depression, and sleep problems. Other Comments: Patient stepped  on a piece of wood from her deck last night. She pulled out the wood, ?piece still in her foot. Open wound, no active bleeding. Last TDaP >10 years   Past History:  Past Medical History: Menopause GERD Urinary incontinence Migraines Depression  Past Surgical History: Reviewed history from 09/09/2006 and no changes required. Cholecystectomy  Family History: Reviewed history from 09/09/2006 and no changes required. Family History of Arthritis Family History Diabetes 1st degree relative Family History High cholesterol Family History of Stroke M 1st degree relative <50 Family History of Cardiovascular disorder  Social History: Reviewed history from 09/09/2006 and no changes required. Married Never Smoked Alcohol use-no Drug use-no Regular exercise-no   Objective:  No acute distress  Right foot plantar surface:  Over MTP joints is a 3mm puncture wound.  No visible foreign body.  No palpable foreign body.  Toes have full range of motion.  Distal neurovascular intact. X-ray right foot:  IMPRESSION: No acute bony findings or radiopaque foreign body. Assessment New Problems: WOUND, FOOT (ICD-892.0) DEPRESSION (ICD-311)  NO EVIDENCE RETAINED FOREIGN BODY  Plan New Medications/Changes: LORTAB 5 5-500 MG TABS (HYDROCODONE-ACETAMINOPHEN) One po q4 to 6hr as needed pain  #10 (ten) x 0, 07/16/2010, Donna Christen MD DOXYCYCLINE HYCLATE 100 MG CAPS (DOXYCYCLINE HYCLATE) One by mouth two times a day  #14 x 0, 07/16/2010, Donna Christen MD  New Orders: T-DG Foot Complete*R* [73630] Tdap => 8yrs IM [90715] Admin 1st Vaccine [90471] Est. Patient Level III [45409] Planning Comments:   Tdap administered Appled Bacitracin/bandage.  Advised to change bandage daily until healed.  Elevate.  Use crutches until pain decreases.  Lortab for pain.  Begin doxycycline. Return for increasing pain, swelling, drainage, heat, etc.   The patient and/or caregiver has been counseled thoroughly  with regard to medications prescribed including dosage, schedule, interactions, rationale for use, and possible side effects and they verbalize understanding.  Diagnoses and expected course of recovery discussed and will return if not improved as expected or if the condition worsens. Patient and/or caregiver verbalized understanding.  Prescriptions: LORTAB 5 5-500 MG TABS (HYDROCODONE-ACETAMINOPHEN) One po q4 to 6hr as needed pain  #10 (ten) x 0   Entered and Authorized by:   Donna Christen MD   Signed by:   Donna Christen MD on 07/16/2010   Method used:   Print then Give to Patient   RxID:   [id number] DOXYCYCLINE HYCLATE 100 MG CAPS (DOXYCYCLINE HYCLATE) One by mouth two times a day  #14 x 0   Entered and Authorized by:   Donna Christen MD   Signed by:   Donna Christen MD on 07/16/2010   Method used:   Print then Give to Patient   RxID:   [id number]   Orders Added: 1)  T-DG Foot Complete*R* [73630] 2)  Tdap => 4yrs IM [32440] 3)  Admin 1st Vaccine [90471] 4)  Est. Patient Level III [10272]  Immunizations Administered:  Tetanus Vaccine:    Vaccine Type: Tdap    Site: left deltoid    Mfr: GlaxoSmithKline    Dose: 0.5 ml    Route: IM    Given by: Lajean Saver RN    Exp. Date: 12/13/2011    Lot #: [id number]    VIS given: 12/07/07 version given July 16, 2010.   Immunizations Administered:  Tetanus Vaccine:    Vaccine Type: Tdap    Site: left deltoid    Mfr: GlaxoSmithKline    Dose: 0.5 ml    Route: IM    Given by: Lajean Saver RN    Exp. Date: 12/13/2011    Lot #: [id number]    VIS given: 12/07/07 version given July 16, 2010.

## 2010-12-22 NOTE — Telephone Encounter (Signed)
  Phone Note Outgoing Call Call back at [facility] Phone [phone]   Call placed by: Lavell Islam RN,  July 18, 2010 6:28 PM Call placed to: Patient Action Taken: Phone Call Completed Summary of Call: Message left on answering machine inquiring about patient's status and encouraging full course of ABX ordered. Initial call taken by: Lavell Islam RN,  July 18, 2010 6:29 PM

## 2010-12-31 ENCOUNTER — Telehealth: Payer: Self-pay | Admitting: Family Medicine

## 2010-12-31 NOTE — Telephone Encounter (Signed)
Pt states towards the end of the Tamiflu she had abdominal cramping and soft stools.  Pt called the urgent care back and was told to continue the Tamiflu even though it may be the cause.  Pt states she is having intestinal griping through her back and achy headache and nausea. Pt states she feels "bad". When pt stands she feels like her heart starts racing and she has to sit down. Pt's temperature has started going back up; now is 99.2.  Pls advise.   Spoke with pt about establishing with a new pcp.  Pt given information for [employer].  Pt states she will call to make an appt.

## 2010-12-31 NOTE — Telephone Encounter (Signed)
Pt went to Urgent care on the 4th and was diagnosed with the flu Tamiflu. Is also having cramps in stomach and was told it could have been caused by the tamiflu. Pt has finished taking medication and symptoms are starting to come back. Pt has not been able to eat much and is not wanting to go back t urgent care. Please contact

## 2010-12-31 NOTE — Telephone Encounter (Signed)
Drink lots of fluids. We should try to work her in to be seen tomorrow if she is still feeling poorly.

## 2011-01-01 ENCOUNTER — Ambulatory Visit (INDEPENDENT_AMBULATORY_CARE_PROVIDER_SITE_OTHER): Payer: Federal, State, Local not specified - PPO | Admitting: Family Medicine

## 2011-01-01 ENCOUNTER — Encounter: Payer: Self-pay | Admitting: Family Medicine

## 2011-01-01 DIAGNOSIS — R5383 Other fatigue: Secondary | ICD-10-CM

## 2011-01-01 DIAGNOSIS — R5381 Other malaise: Secondary | ICD-10-CM

## 2011-01-01 DIAGNOSIS — G43909 Migraine, unspecified, not intractable, without status migrainosus: Secondary | ICD-10-CM

## 2011-01-01 DIAGNOSIS — J019 Acute sinusitis, unspecified: Secondary | ICD-10-CM

## 2011-01-01 LAB — BASIC METABOLIC PANEL
CO2: 27 mEq/L (ref 19–32)
Calcium: 9.2 mg/dL (ref 8.4–10.5)
Creatinine, Ser: 0.8 mg/dL (ref 0.4–1.2)
GFR: 83.94 mL/min (ref >60.00)

## 2011-01-01 LAB — CBC WITH DIFFERENTIAL/PLATELET
Basophils Relative: 0.4 % (ref 0.0–3.0)
Eosinophils Absolute: 0 10*3/uL (ref 0.0–0.7)
Lymphocytes Relative: 23.2 % (ref 12.0–46.0)
MCHC: 33.8 g/dL (ref 30.0–36.0)
Monocytes Relative: 3.9 % (ref 3.0–12.0)
Neutrophils Relative %: 72.4 % (ref 43.0–77.0)
RBC: 4.38 Mil/uL (ref 3.87–5.11)
WBC: 8.3 10*3/uL (ref 4.5–10.5)

## 2011-01-01 MED ORDER — AZITHROMYCIN 250 MG PO TABS: ORAL_TABLET | ORAL | Status: AC

## 2011-01-01 MED ORDER — KETOROLAC TROMETHAMINE 60 MG/2ML IM SOLN
60.0000 mg | Freq: Once | INTRAMUSCULAR | Status: AC
  Administered 2011-01-01: 60 mg via INTRAMUSCULAR

## 2011-01-01 NOTE — Patient Instructions (Signed)
1. Zpak as directed. 2. drink plenty of fluids 3. Rest 4. Over-the-counter medications as needed for symptoms.

## 2011-01-01 NOTE — Telephone Encounter (Signed)
Spoke with pt and she has an appt today at 3:00pm.  Pt will call to cancel if she feels better.

## 2011-01-01 NOTE — Progress Notes (Signed)
Subjective:    Patient ID: Mandy Diaz, female    DOB: 1950/03/04, 60 y.o.   MRN: [id number]  HPI Ms. Kukuk is a 60 year old white female, nonsmoker M. with complaints of headache x8 days, sneezing, and fatigue. On 12/21/2008 she was treated at urgent care for influenza type A. She was given Tamiflu, ibuprofen, and a cough suppressant. Overall she feels better, however she still has upper respiratory symptoms and headache. She denies any current nausea or vomiting, any lightheadedness or dizziness, any chest pain, shortness of breath, or edema.   Review of Systems Review of systems as stated above Past Medical History  Diagnosis Date  . Menopause   . GERD (gastroesophageal reflux disease)   . Urinary incontinence   . Migraine     History   Social History  . Marital Status: Married    Spouse Name: N/A    Number of Children: N/A  . Years of Education: N/A   Occupational History  . Not on file.   Social History Main Topics  . Smoking status: Never Smoker   . Smokeless tobacco: Never Used  . Alcohol Use: No  . Drug Use: No  . Sexually Active: Not on file   Other Topics Concern  . Not on file   Social History Narrative   Regular exercise - NO    Past Surgical History  Procedure Date  . Cholecystectomy     Family History  Problem Relation Age of Onset  . Arthritis    . Diabetes      1st degree relative  . Hyperlipidemia    . Stroke      Female 1st degree relative <50  . Heart disease      Allergies  Allergen Reactions  . Amoxicillin     REACTION: Rash  . Metoclopramide Hcl     REACTION: Affected nerves  . Penicillins     REACTION: Rash  . Sulfamethoxazole     REACTION: Sores in mouth    Current Outpatient Prescriptions on File Prior to Visit  Medication Sig Dispense Refill  . ALPRAZolam (XANAX) 0.5 MG tablet TAKE 1 TABLET BY MOUTH EVERY MORNING , 1 TABLET BY MOUTH EVERY MIDAFTERNOON AND 1 TABLET BY MOUTH EVERY NIGHT AT BEDTIME  90 tablet  5  . atenolol  (TENORMIN) 25 MG tablet Take 1 tablet (25 mg total) by mouth daily. 1/2 po qd  30 tablet  11  . buPROPion (WELLBUTRIN XL) 300 MG 24 hr tablet TAKE ONE TABLET BY MOUTH DAILY FOR DEPRESSION  30 tablet  11  . butalbital-acetaminophen-caffeine (FIORICET, ESGIC) 50-325-40 MG per tablet TAKE 1 TO 2 TABLETS BY MOUTH EVERY 4 HOURS AS NEEDED FOR TENSION HEADACHE. MAX 4 TABLETS PER DAY  100 tablet  11  . cetirizine (ZYRTEC) 10 MG tablet Take 10 mg by mouth daily.        . chlorpheniramine-hydrocodone (TUSSIONEX PENNKINETIC ER) 10-8 MG/5ML LQCR Take 5 mLs by mouth at bedtime as needed. For cough       . Cholecalciferol (VITAMIN D3) 50000 UNITS CAPS Take 1 capsule by mouth once a week. For 12 weeks  12 capsule  1  . citalopram (CELEXA) 40 MG tablet TAKE ONE TABLET BY MOUTH DAILY  30 tablet  11  . clidinium-chlordiazePOXIDE (LIBRAX) 2.5-5 MG per capsule TAKE ONE CAPSULE BY MOUTH FOUR TIMES DAILY FOR IRRITABLE BOWEL  120 capsule  5  . cyanocobalamin (,VITAMIN B-12,) 1000 MCG/ML injection Inject 1 mL (1,000 mcg total) into the muscle  every 30 (thirty) days.  10 mL  1  . cyclobenzaprine (FLEXERIL) 10 MG tablet Take 1 tablet (10 mg total) by mouth 3 (three) times daily as needed for muscle spasms. 1 morning, mid-afternoon and bedtime for muscle spasm, take only 1, 2 if dizzy  90 tablet  5  . DULoxetine (CYMBALTA) 30 MG capsule Take 30 mg by mouth daily.        . DULoxetine (CYMBALTA) 30 MG capsule Take 1 tablet everyday for fibromyalgia.  30 capsule  11  . ergocalciferol (VITAMIN D2) 50000 UNITS capsule Take 50,000 Units by mouth once a week. For 12 weeks       . isometheptene-acetaminophen-dichloralphenazone (MIDRIN) 65-325-100 MG per capsule Take 1 capsule by mouth every 8 (eight) hours as needed. For headache       . NEEDLE, DISP, 25 G (B-D DISP NEEDLE 25GX1") 25G X 1" MISC        . omeprazole (PRILOSEC) 40 MG capsule Take 1 capsule (40 mg total) by mouth 2 (two) times daily as needed. For severe GERD  60 capsule   11  . ondansetron (ZOFRAN) 4 MG tablet Take 4-8 mg by mouth as directed. Every 4 to 6 hours as needed for nausea       . phenylephrine (SUDAFED PE) 10 MG TABS Take 10 mg by mouth 3 (three) times daily as needed. For congestion       . PREMPRO 0.3-1.5 MG per tablet TAKE 1 TABLET BY MOUTH EVERY DAY  28 tablet  11  . promethazine (PHENERGAN) 25 MG tablet Take 1-2 tablets (25-50 mg total) by mouth as directed. Every 4 to 6 hours as needed to prevent nausea or vomiting  30 tablet  5  . SUMAtriptan (IMITREX) 100 MG tablet TAKE 1 TABLET BY MOUTH AT ONSET OF HEADACHE, MAY REPEAT ONCE IF NEEDED  9 tablet  5  . traMADol (ULTRAM) 50 MG tablet Take 1 tablet (50 mg total) by mouth 4 (four) times daily.  120 tablet  5  . tretinoin microspheres (RETIN-A MICRO) 0.04 % gel Apply topically at bedtime. Apply to clean face  45 g  5  . Tyrosine (PURE L-TYROSINE) 500 MG CAPS Take by mouth daily.        . valACYclovir (VALTREX) 500 MG tablet Take 1 tablet (500 mg total) by mouth 3 (three) times daily. For viral infection  90 tablet  5  . zolpidem (AMBIEN) 10 MG tablet Take 1 tablet (10 mg total) by mouth at bedtime as needed for sleep.  30 tablet  5   No current facility-administered medications on file prior to visit.    BP 128/84  Pulse 111  Temp(Src) 99 F (37.2 C) (Oral)  Wt 159 lb (72.122 kg)chart     Objective:   Physical Exam Constitutional: Alert and oriented in no acute distress ENT ears are clear bilaterally, pharynx slightly red and, no sinus tenderness to palpation Neck: No lymphadenopathy, no thyromegaly masses or bruits Lungs, clear to auscultation Cardiac regular rate and rhythm no murmurs rubs or gallops Skin: Warm and dry. No cyanosis       Assessment & Plan:  Assessment: Upper respiratory infection-bacterial secondary to influenza A. Migraine headache  Plan: Symptomatic treatment for relief. Toradol 60 mg IM x1. Labs then to include CBC and CMP to check electrolyte status and blood  counts and she's been sick for 10 days. Note given to return to work in 2 days. Z-Pak as directed to cover for secondary infection.  Call if symptoms worsen or persists, recheck as scheduled and when necessary sooner.

## 2011-03-23 ENCOUNTER — Ambulatory Visit: Payer: Federal, State, Local not specified - PPO | Admitting: Internal Medicine

## 2011-03-27 ENCOUNTER — Ambulatory Visit (INDEPENDENT_AMBULATORY_CARE_PROVIDER_SITE_OTHER): Payer: Federal, State, Local not specified - PPO | Admitting: Internal Medicine

## 2011-03-27 ENCOUNTER — Encounter: Payer: Self-pay | Admitting: Internal Medicine

## 2011-03-27 VITALS — BP 118/80 | HR 91 | Temp 98.4°F | Resp 18 | Ht 65.0 in | Wt 156.0 lb

## 2011-03-27 DIAGNOSIS — K219 Gastro-esophageal reflux disease without esophagitis: Secondary | ICD-10-CM

## 2011-03-27 DIAGNOSIS — M8580 Other specified disorders of bone density and structure, unspecified site: Secondary | ICD-10-CM

## 2011-03-27 DIAGNOSIS — M949 Disorder of cartilage, unspecified: Secondary | ICD-10-CM

## 2011-03-27 DIAGNOSIS — F341 Dysthymic disorder: Secondary | ICD-10-CM

## 2011-03-27 DIAGNOSIS — M899 Disorder of bone, unspecified: Secondary | ICD-10-CM

## 2011-03-27 DIAGNOSIS — E559 Vitamin D deficiency, unspecified: Secondary | ICD-10-CM

## 2011-03-27 MED ORDER — CILIDINIUM-CHLORDIAZEPOXIDE 2.5-5 MG PO CAPS: 1.0000 | ORAL_CAPSULE | Freq: Three times a day (TID) | ORAL | Status: DC

## 2011-03-27 MED ORDER — ALPRAZOLAM 0.5 MG PO TABS: 0.5000 mg | ORAL_TABLET | Freq: Three times a day (TID) | ORAL | Status: DC | PRN

## 2011-03-27 MED ORDER — CITALOPRAM HYDROBROMIDE 40 MG PO TABS: ORAL_TABLET | ORAL | Status: DC

## 2011-03-27 MED ORDER — PANTOPRAZOLE SODIUM 40 MG PO TBEC: 40.0000 mg | DELAYED_RELEASE_TABLET | Freq: Every day | ORAL | Status: DC

## 2011-03-27 MED ORDER — VALACYCLOVIR HCL 500 MG PO TABS: 500.0000 mg | ORAL_TABLET | Freq: Three times a day (TID) | ORAL | Status: DC

## 2011-03-27 MED ORDER — ONDANSETRON HCL 4 MG PO TABS: 4.0000 mg | ORAL_TABLET | ORAL | Status: DC

## 2011-03-27 MED ORDER — TRETINOIN 0.025 % EX GEL: Freq: Every day | CUTANEOUS | Status: DC

## 2011-03-27 MED ORDER — CYANOCOBALAMIN 1000 MCG/ML IJ SOLN: 1000.0000 ug | INTRAMUSCULAR | Status: DC

## 2011-03-27 MED ORDER — PROMETHAZINE HCL 25 MG PO TABS: 25.0000 mg | ORAL_TABLET | ORAL | Status: DC

## 2011-03-27 MED ORDER — ATENOLOL 25 MG PO TABS: 25.0000 mg | ORAL_TABLET | Freq: Every day | ORAL | Status: DC

## 2011-03-27 MED ORDER — DIPHENOXYLATE-ATROPINE 2.5-0.025 MG PO TABS: 1.0000 | ORAL_TABLET | Freq: Four times a day (QID) | ORAL | Status: AC | PRN

## 2011-03-27 MED ORDER — BUTALBITAL-APAP-CAFFEINE 50-325-40 MG PO TABS: ORAL_TABLET | ORAL | Status: DC

## 2011-03-27 NOTE — Patient Instructions (Signed)
Schedule 3 month follow up Bone Density

## 2011-03-27 NOTE — Progress Notes (Signed)
  Subjective:    Patient ID: Mandy Diaz, female    DOB: Aug 27, 1950, 61 y.o.   MRN: [id number]  HPI Pt presents to clinic for followup of multiple medical problems. Former pt of Dr. Scotty Court. H/o anxiety, panic attacks and fibromyalgia. States takes xanax 1-2 per day, celexa qd as well as cymbalta qd for a week at a time but has to stop cymbalta due to sweating. Uses imitrex, phenergan and zofran for migraines. H/o osteopenia without bmd within 2 years per pt. States was previously on boniva but stopped for unknown reason. Had no side effects. Reviewed vit d persistently low. Notes GERD sx's not well controlled with prilosec. Requests refill of retin a.   Past Medical History  Diagnosis Date  . Menopause   . GERD (gastroesophageal reflux disease)   . Urinary incontinence   . Migraine    Past Surgical History  Procedure Date  . Cholecystectomy     reports that she has never smoked. She has never used smokeless tobacco. She reports that she does not drink alcohol or use illicit drugs. family history includes Arthritis in an unspecified family member; Diabetes in an unspecified family member; Heart disease in an unspecified family member; Hyperlipidemia in an unspecified family member; and Stroke in an unspecified family member. Allergies  Allergen Reactions  . Amoxicillin     REACTION: Rash  . Metoclopramide Hcl     REACTION: Affected nerves  . Penicillins     REACTION: Rash  . Sulfamethoxazole     REACTION: Sores in mouth      Review of Systems see hpi     Objective:   Physical Exam  Physical Exam  Nursing note and vitals reviewed. Constitutional: Appears well-developed and well-nourished. No distress.  HENT:  Head: Normocephalic and atraumatic.  Right Ear: External ear normal.  Left Ear: External ear normal.  Eyes: Conjunctivae are normal. No scleral icterus.  Neck: Neck supple. Carotid bruit is not present.  Cardiovascular: Normal rate, regular rhythm and normal heart  sounds.  Exam reveals no gallop and no friction rub.   No murmur heard. Pulmonary/Chest: Effort normal and breath sounds normal. No respiratory distress. He has no wheezes. no rales.  Lymphadenopathy:    He has no cervical adenopathy.  Neurological:Alert.  Skin: Skin is warm and dry. Not diaphoretic.  Psychiatric: Has a normal mood and affect.        Assessment & Plan:

## 2011-03-29 DIAGNOSIS — E559 Vitamin D deficiency, unspecified: Secondary | ICD-10-CM | POA: Insufficient documentation

## 2011-03-29 NOTE — Assessment & Plan Note (Signed)
[place] discussion held regarding multiple medication use. Dc cymbalta and increase celexa. Recommend minimize xanax use and made aware of potential for addiction and/or tolerance. Consider psychiatry referral if sx's not controlled.

## 2011-03-29 NOTE — Assessment & Plan Note (Signed)
Obtain vitamin d level 

## 2011-03-29 NOTE — Assessment & Plan Note (Signed)
schedule bmd

## 2011-03-29 NOTE — Assessment & Plan Note (Signed)
Failing prilosec. Attempt protonix. Followup if no improvement or worsening.

## 2011-03-30 ENCOUNTER — Telehealth: Payer: Self-pay | Admitting: Internal Medicine

## 2011-03-30 ENCOUNTER — Other Ambulatory Visit: Payer: Self-pay | Admitting: Internal Medicine

## 2011-03-30 MED ORDER — AZITHROMYCIN 250 MG PO TABS: ORAL_TABLET | ORAL | Status: DC

## 2011-03-30 NOTE — Telephone Encounter (Signed)
She was in Friday.  Dr Rodena Medin said there wasn't anything to treat yet.  She now has a fever and has come home from work sick.  She has an ear ache and her glands are swollen.  [facility] on [address]

## 2011-03-30 NOTE — Telephone Encounter (Signed)
Call placed to patient at [phone], she was informed per Dr Rodena Medin instructions. She denies having allergy to zpak. She was informed Rx would be sent to pharmacy.

## 2011-03-30 NOTE — Telephone Encounter (Signed)
Call returned to patient at [phone]. She complains of bilateral ear pain, temp 99.4, headaches- taking decongestant- every 4 hours since Friday with no relief along with antihistamines- no relief. She states she has swollen glands in neck, mild nausea, sinus drainage clear,-into throat, denies cough.

## 2011-03-30 NOTE — Telephone Encounter (Signed)
zpak if not allergic and no interactions 

## 2011-03-31 ENCOUNTER — Telehealth: Payer: Self-pay | Admitting: *Deleted

## 2011-03-31 ENCOUNTER — Other Ambulatory Visit: Payer: Self-pay | Admitting: Internal Medicine

## 2011-03-31 DIAGNOSIS — M8580 Other specified disorders of bone density and structure, unspecified site: Secondary | ICD-10-CM

## 2011-03-31 NOTE — Telephone Encounter (Signed)
Liborio Nixon with [facility] called and left voice message stating patient is scheduled for a bone density on 04/01/2011 and they need an order placed by Dr Rodena Medin in order to have patient complete testing.

## 2011-03-31 NOTE — Telephone Encounter (Signed)
Order placed

## 2011-04-01 ENCOUNTER — Telehealth: Payer: Self-pay | Admitting: Internal Medicine

## 2011-04-01 ENCOUNTER — Inpatient Hospital Stay: Admission: RE | Admit: 2011-04-01 | Payer: Federal, State, Local not specified - PPO | Source: Ambulatory Visit

## 2011-04-01 MED ORDER — DOXYCYCLINE HYCLATE 100 MG PO TABS: 100.0000 mg | ORAL_TABLET | Freq: Two times a day (BID) | ORAL | Status: AC

## 2011-04-01 NOTE — Telephone Encounter (Signed)
Patient states that she has been taking azithromycin and that the medication has been upsetting her stomach. She wants to know if there is another medication that she could take?

## 2011-04-01 NOTE — Telephone Encounter (Signed)
Doxy 100mg  bid x 5 day

## 2011-04-01 NOTE — Telephone Encounter (Signed)
Call placed to patient at [phone], she was informed of medication change to pharmacy.

## 2011-04-07 ENCOUNTER — Encounter: Payer: Self-pay | Admitting: Internal Medicine

## 2011-04-07 ENCOUNTER — Ambulatory Visit (INDEPENDENT_AMBULATORY_CARE_PROVIDER_SITE_OTHER)
Admission: RE | Admit: 2011-04-07 | Discharge: 2011-04-07 | Disposition: A | Discharge status: Home / Self Care | Payer: Federal, State, Local not specified - PPO | Source: Ambulatory Visit

## 2011-04-07 DIAGNOSIS — M8580 Other specified disorders of bone density and structure, unspecified site: Secondary | ICD-10-CM

## 2011-04-07 DIAGNOSIS — M899 Disorder of bone, unspecified: Secondary | ICD-10-CM

## 2011-04-08 ENCOUNTER — Telehealth: Payer: Self-pay | Admitting: Internal Medicine

## 2011-04-08 NOTE — Telephone Encounter (Signed)
Call returned [phone], she was informed of lab results from 03/27/2011. A copy has been printed and mailed to patients address on file per her request.

## 2011-04-20 ENCOUNTER — Encounter: Payer: Self-pay | Admitting: Internal Medicine

## 2011-04-21 ENCOUNTER — Telehealth: Payer: Self-pay | Admitting: *Deleted

## 2011-04-21 NOTE — Telephone Encounter (Signed)
See bone density report note 04/07/2011.

## 2011-05-12 NOTE — Progress Notes (Incomplete)
  Subjective:    Patient ID: Mandy Diaz, female    DOB: [birth year], 61 y.o.   MRN: [id number] 61 year old white married female is in for yearly examination. She relates that she continues to have anxiety as well as her migraine and tension headaches has continued to take her protonix  for GERD good control. Continued to take her vitamin B 12 injections monthly continues to have stress and anxiety and some depression with family situation and is continued to take alprazolam sinus tach cardia and has continued atenolol. States needs refills of medications. HPI also complains of muscle aches and paincompatable with fibromyalgia     Review of Systems CHPI for review of systems review     Objective:   Physical Exam the patient is a well-developed well-nourished white female in no distress alert and cooperative HEENT no positive findings carotid pulses are good Heart no murmurs regular rhythm with no cardiomegaly. Lungs clear palpation percussion and auscultation Breasts no masses no tenderness full breast axilla clear Abdomen liver spleen kidneys are normal no masses no tenderness Pelvic  Exam  Uterus normal, adnexal areas neg, ovaries normal size, no tenderness. Pap done Extremities  Joints normal, good pulses Skin neg Neurological exam reveal no abnormalities.        Assessment & Plan:  Physical examination is essentially normal however patient has numerous medical problems such as migraine headaches tension headaches of anxiety depression GERD  also symptoms compatible with fibromyalgia have recommended Flexeril as well tramadol for pain or plan to refill medications also get also necessry lab studies

## 2011-05-12 NOTE — Progress Notes (Signed)
<!--  EPICS-->Quick Note:<BR><BR>Pt aware.<BR>______<BR><!--EPICE--> ptaware of labs

## 2011-07-03 ENCOUNTER — Other Ambulatory Visit: Payer: Self-pay | Admitting: Family Medicine

## 2011-07-24 ENCOUNTER — Telehealth: Payer: Self-pay | Admitting: *Deleted

## 2011-07-24 NOTE — Telephone Encounter (Signed)
Received PA request for Pantoprazole 40 mg tablets #30/30 from pharmacy; called [employer] [pt ID [id number]] PA department [[phone]] and completed process via telephone for Approval. Approval received for 05.05.2013 - 07.05.2014; pharmacy informed, Rx went through/SLS

## 2011-08-05 ENCOUNTER — Ambulatory Visit (INDEPENDENT_AMBULATORY_CARE_PROVIDER_SITE_OTHER): Payer: Federal, State, Local not specified - PPO | Admitting: Physician Assistant

## 2011-08-05 VITALS — BP 100/70 | HR 86 | Temp 98.7°F | Resp 16 | Ht 65.5 in | Wt 150.8 lb

## 2011-08-05 DIAGNOSIS — J069 Acute upper respiratory infection, unspecified: Secondary | ICD-10-CM

## 2011-08-05 DIAGNOSIS — R509 Fever, unspecified: Secondary | ICD-10-CM

## 2011-08-05 DIAGNOSIS — Z20818 Contact with and (suspected) exposure to other bacterial communicable diseases: Secondary | ICD-10-CM

## 2011-08-05 DIAGNOSIS — R05 Cough: Secondary | ICD-10-CM

## 2011-08-05 NOTE — Progress Notes (Signed)
  Subjective:    Patient ID: Mandy Diaz, female    DOB: Mar 27, 1950, 61 y.o.   MRN: [id number]  HPI  Pt presents to clinic with 4 day h/o cold symptoms.  She has PND with low grade fever and congestion with clear rhinorrhea.  She has myalgias and just feels bad.  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  She has been using OTC motrin and cold preps for her symptoms with  Minimal relief.  She has been exposed to strep throat but has no sore throat.  Review of Systems  Constitutional: Positive for chills and fatigue.  HENT: Positive for ear pain (R>L), congestion (clear) and postnasal drip. Negative for voice change.   Respiratory: Positive for cough (from PND). Negative for shortness of breath.   Gastrointestinal: Positive for nausea (unsettled stomach ).  Musculoskeletal: Positive for myalgias.       Objective:   Physical Exam  Nursing note and vitals reviewed. Constitutional: She is oriented to person, place, and time. She appears well-developed and well-nourished.  HENT:  Head: Normocephalic and atraumatic.  Right Ear: Hearing, external ear and ear canal normal. Tympanic membrane is bulging. Tympanic membrane is not erythematous. A middle ear effusion (clear fluid) is present.  Left Ear: Hearing, external ear and ear canal normal. Tympanic membrane is bulging. Tympanic membrane is not erythematous. A middle ear effusion (clear fluid) is present.  Nose: Nose normal.  Mouth/Throat: Oropharynx is clear and moist. No oropharyngeal exudate.  Neck: Normal range of motion. Neck supple. No thyromegaly present.  Cardiovascular: Normal rate, regular rhythm and normal heart sounds.  Exam reveals no gallop.   No murmur heard. Pulmonary/Chest: Effort normal and breath sounds normal. No respiratory distress.  Lymphadenopathy:    She has no cervical adenopathy.  Neurological:  She is alert and oriented to person, place, and time.  Skin: Skin is warm and dry.  Psychiatric: She has a normal mood and affect. Her behavior is normal. Judgment and thought content normal.    1. Fever  POCT rapid strep A  2. URI (upper respiratory infection)    3. Cough    4. Exposure to strep throat  POCT rapid strep A         Assessment & Plan:   1. Fever  POCT rapid strep A  2. URI (upper respiratory infection)    3. Cough    4. Exposure to strep throat  POCT rapid strep A   Pt should use OTC mucinex and continue sinus medication.  She should push fluids and try steam for sinus passageways.  Note given for work. If her symptoms are not better in 3-5d will call for an antibiotic.  Has used Doxy before without problems.  Pt understands and agrees with the above.

## 2011-08-07 ENCOUNTER — Telehealth: Payer: Self-pay

## 2011-08-07 MED ORDER — DOXYCYCLINE HYCLATE 100 MG PO TABS: 100.0000 mg | ORAL_TABLET | Freq: Two times a day (BID) | ORAL | Status: AC

## 2011-08-07 NOTE — Telephone Encounter (Signed)
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

## 2011-08-07 NOTE — Telephone Encounter (Signed)
Pt was seen in office PA Weber she would like to let Maralyn Sago know she is not feeling better. Please contact pt to advise further treatment. [phone]

## 2011-08-07 NOTE — Telephone Encounter (Signed)
Sent in ABX to [facility]

## 2011-08-25 ENCOUNTER — Emergency Department (INDEPENDENT_AMBULATORY_CARE_PROVIDER_SITE_OTHER): Payer: Federal, State, Local not specified - PPO

## 2011-08-25 ENCOUNTER — Emergency Department
Admission: EM | Admit: 2011-08-25 | Discharge: 2011-08-25 | Disposition: A | Discharge status: Home / Self Care | Payer: Federal, State, Local not specified - PPO | Source: Home / Self Care

## 2011-08-25 ENCOUNTER — Encounter: Payer: Self-pay | Admitting: *Deleted

## 2011-08-25 DIAGNOSIS — X500XXA Overexertion from strenuous movement or load, initial encounter: Secondary | ICD-10-CM

## 2011-08-25 DIAGNOSIS — S93503A Unspecified sprain of unspecified great toe, initial encounter: Secondary | ICD-10-CM

## 2011-08-25 DIAGNOSIS — S99929A Unspecified injury of unspecified foot, initial encounter: Secondary | ICD-10-CM

## 2011-08-25 DIAGNOSIS — S93699A Other sprain of unspecified foot, initial encounter: Secondary | ICD-10-CM

## 2011-08-25 DIAGNOSIS — M79609 Pain in unspecified limb: Secondary | ICD-10-CM

## 2011-08-25 NOTE — ED Notes (Signed)
Pt c/o LT great toe injury x 1130 today, after tripping on a rug at home. She has taken IBF and applied ice.

## 2011-08-25 NOTE — ED Provider Notes (Signed)
History     CSN: [id number]  Arrival date & time 08/25/11  1607   First MD Initiated Contact with Patient 08/25/11 1615      Chief Complaint  Patient presents with  . Toe Injury  HPI Comments: Pt was walking in house.  L  great toe caught on rug. Legs buckled under pt with pt spinning around. Felt great toe hyperflex and stay in that position for prolonged period of time along with having majority of weight on great toe. Pt states that she never fell.  Has had significant great toe pain and swelling since this point.  Has been able to bear weight minimally on great toe since this point.    Patient is a 61 y.o. female presenting with toe pain.  Toe Pain This is a new problem. The current episode started 6 to 12 hours ago. The problem occurs constantly.    Past Medical History  Diagnosis Date  . Menopause   . GERD (gastroesophageal reflux disease)   . Urinary incontinence   . Migraine     Past Surgical History  Procedure Date  . Cholecystectomy     Family History  Problem Relation Age of Onset  . Arthritis    . Diabetes      1st degree relative  . Hyperlipidemia    . Stroke      Female 1st degree relative <50  . Heart disease      History  Substance Use Topics  . Smoking status: Never Smoker   . Smokeless tobacco: Never Used  . Alcohol Use: No    OB History    Grav Para Term Preterm Abortions TAB SAB Ect Mult Living                  Review of Systems  All other systems reviewed and are negative.    Allergies  Amoxicillin; Metoclopramide hcl; Penicillins; and Sulfamethoxazole  Home Medications   Current Outpatient Rx  Name Route Sig Dispense Refill  . ALPRAZOLAM 0.5 MG PO TABS Oral Take 1 tablet (0.5 mg total) by mouth 3 (three) times daily as needed for sleep. 90 tablet 3  . ATENOLOL 25 MG PO TABS Oral Take 1 tablet (25 mg total) by mouth daily. 30 tablet 12  . BUPROPION HCL ER (XL) 300 MG PO TB24  TAKE ONE TABLET BY MOUTH DAILY FOR DEPRESSION 30  tablet 11  . BUTALBITAL-APAP-CAFFEINE 50-325-40 MG PO TABS  Take 1-2 tablets by mouth every 4 hours prn tension headache. Max 4 tablets per day 60 tablet 3  . BUTALBITAL-APAP-CAFFEINE 50-325-40 MG PO TABS  TAKE 1 TO 2 TABLETS BY MOUTH EVERY 4 HOURS AS NEEDED FOR TENSION HEADACHE. MAX 4 TABLETS PER DAY 100 tablet 11  . CALCIUM 1200 PO Oral Take 1,200 mg by mouth daily.    Marland Kitchen CETIRIZINE HCL 10 MG PO TABS Oral Take 10 mg by mouth daily.      Marland Kitchen VITAMIN D3 3000 UNITS PO TABS Oral Take 3,000 Units by mouth daily.    Marland Kitchen CITALOPRAM HYDROBROMIDE 40 MG PO TABS  Take 1.5 tablets by mouth once a day 45 tablet 6  . CLINDINIUM-CHLORDIAZEPOXIDE 2.5-5 MG PO CAPS Oral Take 1 capsule by mouth 4 (four) times daily -  before meals and at bedtime. 120 capsule 6  . CYANOCOBALAMIN 1000 MCG/ML IJ SOLN Intramuscular Inject 1 mL (1,000 mcg total) into the muscle every 14 (fourteen) days. 2 mL 12  . CYCLOBENZAPRINE HCL 10 MG PO TABS  Oral Take 1 tablet (10 mg total) by mouth 3 (three) times daily as needed for muscle spasms. 1 morning, mid-afternoon and bedtime for muscle spasm, take only 1, 2 if dizzy 90 tablet 5  . DM-GUAIFENESIN ER 30-600 MG PO TB12 Oral Take 1 tablet by mouth every 12 (twelve) hours.    Marland Kitchen NEEDLE (DISP) 25G X 1" MISC       . ONDANSETRON HCL 4 MG PO TABS Oral Take 1-2 tablets (4-8 mg total) by mouth as directed. Every 4 to 6 hours as needed for nausea 20 tablet 3  . PANTOPRAZOLE SODIUM 40 MG PO TBEC Oral Take 1 tablet (40 mg total) by mouth daily. 30 tablet 12  . PHENYLEPHRINE HCL 10 MG PO TABS Oral Take 10 mg by mouth 3 (three) times daily as needed. For congestion     . PREMPRO 0.3-1.5 MG PO TABS  TAKE 1 TABLET BY MOUTH EVERY DAY 28 tablet 11  . PROMETHAZINE HCL 25 MG PO TABS Oral Take 1-2 tablets (25-50 mg total) by mouth as directed. Every 4 to 6 hours as needed to prevent nausea or vomiting 30 tablet 3  . PSEUDOEPHEDRINE HCL 30 MG PO TABS Oral Take 30 mg by mouth every 4 (four) hours as needed.    Marland Kitchen  PSEUDOEPHEDRINE-ACETAMINOPHEN 30-500 MG PO TABS Oral Take 1 tablet by mouth every 4 (four) hours as needed.    . SUMATRIPTAN SUCCINATE 100 MG PO TABS  TAKE 1 TABLET BY MOUTH AT ONSET OF HEADACHE, MAY REPEAT ONCE IF NEEDED 9 tablet 5  . TRAMADOL HCL 50 MG PO TABS Oral Take 1 tablet (50 mg total) by mouth 4 (four) times daily. 120 tablet 5  . TRAMADOL HCL 50 MG PO TABS  TAKE 1 TO 2 TABLETS BY MOUTH EVERY 4 HOURS AS NEEDED FOR PAIN 100 tablet 5  . TRETINOIN 0.025 % EX GEL Topical Apply topically at bedtime. 45 g 3  . VALACYCLOVIR HCL 500 MG PO TABS Oral Take 1 tablet (500 mg total) by mouth 3 (three) times daily. For viral infection 90 tablet 3  . ZOLPIDEM TARTRATE 10 MG PO TABS Oral Take 1 tablet (10 mg total) by mouth at bedtime as needed for sleep. 30 tablet 5    BP 144/84  Pulse 84  Temp 98.3 F (36.8 C) (Oral)  Resp 16  Ht 5' 5.5" (1.664 m)  Wt 153 lb 8 oz (69.627 kg)  BMI 25.16 kg/m2  SpO2 99%  Physical Exam  Constitutional: She appears well-developed and well-nourished.  HENT:  Head: Normocephalic and atraumatic.  Eyes: Conjunctivae are normal. Pupils are equal, round, and reactive to light.  Neck: Normal range of motion. Neck supple.  Cardiovascular: Normal rate and regular rhythm.   Pulmonary/Chest: Effort normal and breath sounds normal.  Abdominal: Soft. Bowel sounds are normal.  Genitourinary: Vagina normal.  Musculoskeletal:       Feet:    ED Course  Procedures (including critical care time)  Labs Reviewed - No data to display Dg Foot Complete Left  08/25/2011  *RADIOLOGY REPORT*  Clinical Data:  left great toe pain, injury  LEFT FOOT - COMPLETE 3+ VIEW  Comparison: None.  Findings: Normal alignment without fracture.  Very minor left first MTP joint space narrowing and bony spurring.  No radiographic swelling or foreign body.  IMPRESSION: Minor degenerative changes.  No acute osseous finding  Original Report Authenticated By: Judie Petit. Ruel Favors, M.D.     1. Sprain of  great toe  MDM  Xrays negative for fracture.  Likely great toe ligamentous strain.  Will place in post op shoe.  RICE and NSAIDs.  Avoid extended weight bearing on affected joint.  Plan for follow up with sports medicine.     The patient and/or caregiver has been counseled thoroughly with regard to treatment plan and/or medications prescribed including dosage, schedule, interactions, rationale for use, and possible side effects and they verbalize understanding. Diagnoses and expected course of recovery discussed and will return if not improved as expected or if the condition worsens. Patient and/or caregiver verbalized understanding.             Floydene Flock, MD 08/25/11 [phone]

## 2011-08-26 NOTE — ED Provider Notes (Signed)
Agree with exam, assessment, and plan.   Lattie Haw, MD 08/26/11 [phone]

## 2011-09-14 ENCOUNTER — Other Ambulatory Visit: Payer: Self-pay | Admitting: Family Medicine

## 2011-09-22 ENCOUNTER — Other Ambulatory Visit: Payer: Self-pay | Admitting: *Deleted

## 2011-09-22 MED ORDER — BUPROPION HCL ER (XL) 300 MG PO TB24: ORAL_TABLET | ORAL | Status: DC

## 2011-09-22 NOTE — Telephone Encounter (Signed)
Pharmacy has been sending request to [facility] office; called pharmacy for refill authorization on Wellbutrin [per Melissa O'Sullivan] & clarification of PCP & location; pt informed/SLS

## 2011-09-30 ENCOUNTER — Telehealth: Payer: Self-pay | Admitting: Internal Medicine

## 2011-09-30 MED ORDER — CONJ ESTROG-MEDROXYPROGEST ACE 0.3-1.5 MG PO TABS: ORAL_TABLET | ORAL | Status: DC

## 2011-09-30 NOTE — Telephone Encounter (Signed)
Rx done/SLS 

## 2011-09-30 NOTE — Telephone Encounter (Signed)
Patient is requesting a refill on prempro to be sent to [facility] on [address]

## 2011-10-28 ENCOUNTER — Other Ambulatory Visit: Payer: Self-pay | Admitting: Internal Medicine

## 2011-11-23 ENCOUNTER — Telehealth: Payer: Self-pay | Admitting: Internal Medicine

## 2011-11-23 NOTE — Telephone Encounter (Signed)
Pt is to be taking Vitamin D OTC according to 03.09.13 results:   Take otc vit d 3000 units qd. Recheck vit d level in 6wks   No record of repeat lab in EMR/SLS Please advise.

## 2011-11-23 NOTE — Telephone Encounter (Signed)
Refill- vit d 50,000 IU D2(ergo) caps. Take one capsule by mouth once a week for 12 weeks. Qty 12 last fill 9.6.12

## 2011-11-23 NOTE — Telephone Encounter (Signed)
Recommend what was recommended in the spring. 3000 qd and recheck level 6wks later. Also believe she either missed f/u appt or didn't schedule up front. avs from last visit said 29m

## 2011-11-26 NOTE — Telephone Encounter (Signed)
LMOM with contact name & number for return call RE: Vitamin D usage & f/u  Lab/SLS

## 2011-11-27 ENCOUNTER — Telehealth: Payer: Self-pay | Admitting: Internal Medicine

## 2011-11-27 NOTE — Telephone Encounter (Signed)
LMOM [2nd] with contact name & number for return call RE: Vitamin D usage & f/u [also need to confirm f/u on DG breast evaluation]/SLS

## 2011-11-27 NOTE — Telephone Encounter (Signed)
THIS IS DUPLICATE-Please see 11.04.13 note/SLS

## 2011-11-27 NOTE — Telephone Encounter (Signed)
REFILL VIT D 50,000 IU D2 CAPS TAKE 1 CAPSULE BY MOUTH ONCE A WEEK FOR 12 WEEKS QTY 12 LAST FILL 09-27-2010

## 2011-12-03 ENCOUNTER — Encounter: Payer: Self-pay | Admitting: *Deleted

## 2011-12-04 NOTE — Telephone Encounter (Signed)
Letter mailed/SLS 

## 2012-01-04 ENCOUNTER — Other Ambulatory Visit: Payer: Self-pay | Admitting: Family Medicine

## 2012-01-22 ENCOUNTER — Ambulatory Visit (INDEPENDENT_AMBULATORY_CARE_PROVIDER_SITE_OTHER): Payer: Federal, State, Local not specified - PPO | Admitting: Family Medicine

## 2012-01-22 ENCOUNTER — Encounter: Payer: Self-pay | Admitting: Family Medicine

## 2012-01-22 VITALS — BP 119/79 | HR 83 | Temp 98.2°F | Resp 16 | Ht 66.0 in | Wt 148.0 lb

## 2012-01-22 DIAGNOSIS — M25559 Pain in unspecified hip: Secondary | ICD-10-CM

## 2012-01-22 DIAGNOSIS — G43009 Migraine without aura, not intractable, without status migrainosus: Secondary | ICD-10-CM

## 2012-01-22 DIAGNOSIS — K5790 Diverticulosis of intestine, part unspecified, without perforation or abscess without bleeding: Secondary | ICD-10-CM

## 2012-01-22 DIAGNOSIS — K5730 Diverticulosis of large intestine without perforation or abscess without bleeding: Secondary | ICD-10-CM

## 2012-01-22 DIAGNOSIS — R1084 Generalized abdominal pain: Secondary | ICD-10-CM

## 2012-01-22 DIAGNOSIS — M25551 Pain in right hip: Secondary | ICD-10-CM

## 2012-01-22 LAB — POCT URINALYSIS DIPSTICK
Nitrite, UA: NEGATIVE
Protein, UA: NEGATIVE
Spec Grav, UA: 1.025
Urobilinogen, UA: 0.2

## 2012-01-22 LAB — IFOBT (OCCULT BLOOD): IFOBT: NEGATIVE

## 2012-01-22 MED ORDER — RIZATRIPTAN BENZOATE 5 MG PO TABS: 5.0000 mg | ORAL_TABLET | ORAL | Status: DC | PRN

## 2012-01-22 MED ORDER — CIPROFLOXACIN HCL 500 MG PO TABS: 500.0000 mg | ORAL_TABLET | Freq: Two times a day (BID) | ORAL | Status: DC

## 2012-01-22 NOTE — Patient Instructions (Addendum)
Referral has been made to Dr. Matthias Hughs to get your colonoscopy scheduled. I have prescribed Cipro 500 mg 1 tablet twice a day for diverticulitis. Maxalt 5 mg tablets have been prescribed for migraine disorder. Take as directed. You have Tramadol which can be taken for back and hip pain. Also try some topical product like Arnica gel or Tiger balm applied to painful areas to relieve pain.

## 2012-01-26 ENCOUNTER — Encounter: Payer: Self-pay | Admitting: Family Medicine

## 2012-01-26 NOTE — Progress Notes (Signed)
S: This 62 y.o. Cauc female presents w/ hx of low grade fever, nausea, abdominal pain w/ bloating. She has a hx of diverticular disease; similar symptoms in the past were treated w/ antibiotic and pt felt better within a few days. She denies chills or unexplained weight loss, vomiting, diarrhea or constipation, BRBPR or melena. She has had some mild discomfort in mid-back which started about the same time as the GI symptoms. She does not racall eating any foods that may have precipitated this episode nor has she travelled out of the country or state recently. No other family members report illness. She has taken Librax which has helped reduce discomfort.  Pt also has "migraine headache" disorder for which she uses Imitrex. A friend informed her about a different type of medication that could be as effective as Imitrex but at a lower dose. Pt states Imitrex is too potent for her ans she takes just a fraction of a tablet when she has a headache. HA is usually located on right side of skull, associated with mild vision disturbance and nausea.  Pt c/o bilateral hip pain located in posterior hip area. This is not daily pain and not associated w/ acute trauma. Ambulation is not limited because of pain. Pt has medication that she takes for other musculoskeletal complaints.  The pt has been receiving some care from an [facility] Provider and she has copy of labs drawn on 01/04/2012 (most of the labs appear to be related to menopause). (Results scanned into record).  ROS: As per HPI.   O:  Filed Vitals:   01/22/12 1020  BP: 119/79  Pulse: 83  Temp: 98.2 F (36.8 C)  Resp: 16   GEN: In NAD; WN,WD. HEENT: [place]/AT; EOMI w/ clear conj and sclerae; EACs/TMs normal. Oroph is clear and moist. NECK: Supple w/o LAN or TMG. COR: RRR. LUNGS: CTA. Normal resp rate and effort. GI: Abd- normal appearance; flat w/ increased BS; no distention. Tneder in LLQ but no guarding or rebound. No Masses or HSM  RECTAL:  No rectal tone; hemosure- negative. SKIN: W7D; no rashes or pallor. NEURO: A&O x 3; CNs intact. Nonfocal.  Results for orders placed in visit on 01/22/12  POCT URINALYSIS DIPSTICK      Component Value Range   Color, UA yellow     Clarity, UA clear     Glucose, UA neg     Bilirubin, UA neg     Ketones, UA neg     Spec Grav, UA 1.025     Blood, UA trace     pH, UA 5.5     Protein, UA neg     Urobilinogen, UA 0.2     Nitrite, UA neg     Leukocytes, UA small (1+)    IFOBT (OCCULT BLOOD)      Component Value Range   IFOBT Negative      A/P:  1. Abdominal pain, generalized  Diet modifications- clear liquids for next 48 hours May be related to polypharmacy  2. Diverticular disease  Ambulatory referral to Gastroenterology RX: Cipro 500 mg 1 tab bid x 10 days  3. Migraine headache without aura  RX: Maxalt 5 mg tab trial  4. Hip pain, bilateral  Symptomatic treatment (continue current medication- Cyclobenzaprine and Tramadol prescribed by another M.D. Also recommended daily stretching

## 2012-01-28 ENCOUNTER — Telehealth: Payer: Self-pay

## 2012-01-28 NOTE — Telephone Encounter (Signed)
SHEILA FROM DR [facility] OFFICE STATES THEY NEED RECORDS/LABS FAXED ON PT PLEASE FAX TO [phone] AND THE PHONE NUMBER IS [phone] PT HAVE AN APPT THIS AFTERNOON

## 2012-01-28 NOTE — Telephone Encounter (Signed)
Records sent thru Epic to Dr. Donavan Burnet office.

## 2012-02-01 ENCOUNTER — Telehealth: Payer: Self-pay | Admitting: Internal Medicine

## 2012-02-01 MED ORDER — ALPRAZOLAM 0.5 MG PO TABS: 0.5000 mg | ORAL_TABLET | Freq: Three times a day (TID) | ORAL | Status: DC | PRN

## 2012-02-01 MED ORDER — CITALOPRAM HYDROBROMIDE 40 MG PO TABS: ORAL_TABLET | ORAL | Status: DC

## 2012-02-01 NOTE — Telephone Encounter (Signed)
Refill- citalopram 40mg  tablets. Take one and 1/2 tablets by mouth every day. Qty 45 last fill 12.16.13

## 2012-02-01 NOTE — Telephone Encounter (Signed)
Patient states she is not changing PCP, been seeing [facility] for GI [diverticulitis issues] & will be having colonoscopy on 03.14.14 w/Dr. Matthias Hughs; pt informed that she must have OV prior to future refills, understood & agreed to call back & schedule/SLS

## 2012-02-01 NOTE — Telephone Encounter (Signed)
Citalopram request [Last Rx 10.09.13 #45x2]--Last OV 03.08.13, has seen [facility] since then/SLS Please advise.

## 2012-02-01 NOTE — Telephone Encounter (Signed)
If has changed providers then deny. Otherwise one month with rf1 and needs appt

## 2012-02-16 ENCOUNTER — Ambulatory Visit (INDEPENDENT_AMBULATORY_CARE_PROVIDER_SITE_OTHER): Payer: Federal, State, Local not specified - PPO | Admitting: Family

## 2012-02-16 ENCOUNTER — Encounter: Payer: Self-pay | Admitting: Family

## 2012-02-16 VITALS — BP 124/80 | HR 93 | Temp 98.7°F | Resp 16 | Wt 147.1 lb

## 2012-02-16 DIAGNOSIS — N390 Urinary tract infection, site not specified: Secondary | ICD-10-CM

## 2012-02-16 DIAGNOSIS — L309 Dermatitis, unspecified: Secondary | ICD-10-CM

## 2012-02-16 DIAGNOSIS — L259 Unspecified contact dermatitis, unspecified cause: Secondary | ICD-10-CM

## 2012-02-16 DIAGNOSIS — R300 Dysuria: Secondary | ICD-10-CM

## 2012-02-16 LAB — POCT URINALYSIS DIPSTICK
Ketones, UA: NEGATIVE
Nitrite, UA: POSITIVE
Protein, UA: 30

## 2012-02-16 MED ORDER — CEFUROXIME AXETIL 500 MG PO TABS: 500.0000 mg | ORAL_TABLET | Freq: Two times a day (BID) | ORAL | Status: DC

## 2012-02-16 NOTE — Progress Notes (Signed)
Subjective:    Patient ID: Mandy Diaz, female    DOB: 10-29-1950, 62 y.o.   MRN: [id number]  HPI  Mandy Diaz is a 63 yr old female who presents today with chief complaint of dysuria. She was diagnosed with diverticulitis on 12/30 in urgent care.  She was started on cipro at that time.  She then saw Dr. Matthias Hughs on 1/3.  He continued her on her current dose of cipro. She reports resolution of her diverticular symptoms.  Today she reports a 5 day history of dysuria.  She reports lower abdominal pain.  She denies fever. She reports a mild low back pain.    Skin rash- notes some dry peeling patches on her fingers/hands.  Review of Systems See HPI  Past Medical History  Diagnosis Date  . Menopause   . GERD (gastroesophageal reflux disease)   . Urinary incontinence   . Migraine     History   Social History  . Marital Status: Married    Spouse Name: N/A    Number of Children: N/A  . Years of Education: N/A   Occupational History  . Not on file.   Social History Main Topics  . Smoking status: Never Smoker   . Smokeless tobacco: Never Used  . Alcohol Use: No  . Drug Use: No  . Sexually Active: Not on file   Other Topics Concern  . Not on file   Social History Narrative   Regular exercise - NO    Past Surgical History  Procedure Date  . Cholecystectomy     Family History  Problem Relation Age of Onset  . Arthritis    . Diabetes      1st degree relative  . Hyperlipidemia    . Stroke      Female 1st degree relative <50  . Heart disease      Allergies  Allergen Reactions  . Amoxicillin     REACTION: Rash  . Metoclopramide Hcl     REACTION: Affected nerves  . Penicillins     REACTION: Rash  . Sulfamethoxazole     REACTION: Sores in mouth    Current Outpatient Prescriptions on File Prior to Visit  Medication Sig Dispense Refill  . ALPRAZolam (XANAX) 0.5 MG tablet Take 1 tablet (0.5 mg total) by mouth 3 (three) times daily as needed for sleep.  90 tablet  1    . atenolol (TENORMIN) 25 MG tablet Take 1 tablet (25 mg total) by mouth daily.  30 tablet  12  . buPROPion (WELLBUTRIN XL) 300 MG 24 hr tablet TAKE ONE TABLET BY MOUTH DAILY FOR DEPRESSION  30 tablet  5  . butalbital-acetaminophen-caffeine (FIORICET, ESGIC) 50-325-40 MG per tablet TAKE 1 TO 2 TABLETS BY MOUTH EVERY 4 HOURS AS NEEDED FOR TENSION HEADACHE. MAX 4 TABLETS PER DAY  100 tablet  11  . cetirizine (ZYRTEC) 10 MG tablet Take 10 mg by mouth daily as needed.       . Cholecalciferol (VITAMIN D3) 3000 UNITS TABS Take 5,000 Units by mouth 2 (two) times daily.       . citalopram (CELEXA) 40 MG tablet TAKE 1 AND 1/2 TABLETS BY MOUTH DAILY  45 tablet  1  . clidinium-chlordiazePOXIDE (LIBRAX) 2.5-5 MG per capsule Take 1 capsule by mouth 4 (four) times daily -  before meals and at bedtime.  120 capsule  6  . cyanocobalamin (,VITAMIN B-12,) 1000 MCG/ML injection Inject 1 mL (1,000 mcg total) into the muscle every 14 (  fourteen) days.  2 mL  12  . cyclobenzaprine (FLEXERIL) 10 MG tablet Take 1 tablet (10 mg total) by mouth 3 (three) times daily as needed for muscle spasms. 1 morning, mid-afternoon and bedtime for muscle spasm, take only 1, 2 if dizzy  90 tablet  5  . estradiol (ESTRACE) 1 MG tablet Take 1 mg by mouth daily.      . pantoprazole (PROTONIX) 40 MG tablet Take 1 tablet (40 mg total) by mouth daily.  30 tablet  12  . progesterone (PROMETRIUM) 100 MG capsule Take 100 mg by mouth At bedtime.      . rizatriptan (MAXALT) 5 MG tablet Take 1 tablet (5 mg total) by mouth as needed for migraine. May repeat in 2 hours if needed  10 tablet  0  . SUMAtriptan (IMITREX) 100 MG tablet TAKE 1 TABLET BY MOUTH AT ONSET OF HEADACHE, MAY REPEAT ONCE IF NEEDED  9 tablet  5  . Thyroid 48.75 MG TABS Take 1 tablet by mouth 2 (two) times daily.       . traMADol (ULTRAM) 50 MG tablet TAKE 1 TO 2 TABLETS BY MOUTH EVERY 4 HOURS AS NEEDED FOR PAIN  100 tablet  5  . valACYclovir (VALTREX) 500 MG tablet Take 1 tablet (500  mg total) by mouth 3 (three) times daily. For viral infection  90 tablet  3  . zolpidem (AMBIEN) 10 MG tablet Take 1 tablet (10 mg total) by mouth at bedtime as needed for sleep.  30 tablet  5  . ciprofloxacin (CIPRO) 500 MG tablet Take 1 tablet (500 mg total) by mouth 2 (two) times daily.  20 tablet  0  . NEEDLE, DISP, 25 G (B-D DISP NEEDLE 25GX1") 25G X 1" MISC        . [DISCONTINUED] DULoxetine (CYMBALTA) 30 MG capsule Take 1 tablet everyday for fibromyalgia.  30 capsule  11  . [DISCONTINUED] isometheptene-acetaminophen-dichloralphenazone (MIDRIN) 65-325-100 MG per capsule Take 1 capsule by mouth every 8 (eight) hours as needed. For headache       . [DISCONTINUED] omeprazole (PRILOSEC) 40 MG capsule Take 1 capsule (40 mg total) by mouth 2 (two) times daily as needed. For severe GERD  60 capsule  11    BP 124/80  Pulse 93  Temp 98.7 F (37.1 C) (Oral)  Resp 16  Wt 147 lb 1.3 oz (66.715 kg)  SpO2 98%       Objective:   Physical Exam  Constitutional: She is oriented to person, place, and time. She appears well-developed and well-nourished. No distress.  Cardiovascular: Normal rate and regular rhythm.   No murmur heard. Pulmonary/Chest: Effort normal and breath sounds normal. No respiratory distress. She has no wheezes. She has no rales. She exhibits no tenderness.  Abdominal: Soft.       Mild lower abdominal tenderness to palpation without guarding.   Musculoskeletal: She exhibits no edema.  Neurological: She is alert and oriented to person, place, and time.  Skin: Skin is warm and dry.       Several dry/peeling patches noted on hands.  Psychiatric: She has a normal mood and affect. Her behavior is normal. Judgment and thought content normal.          Assessment & Plan:

## 2012-02-16 NOTE — Patient Instructions (Addendum)
Please call if symptoms worsen, or if not improved in 2-3 days.  

## 2012-02-17 DIAGNOSIS — N390 Urinary tract infection, site not specified: Secondary | ICD-10-CM | POA: Insufficient documentation

## 2012-02-17 DIAGNOSIS — L309 Dermatitis, unspecified: Secondary | ICD-10-CM | POA: Insufficient documentation

## 2012-02-17 LAB — URINE CULTURE: Colony Count: NO GROWTH

## 2012-02-17 NOTE — Assessment & Plan Note (Signed)
UA consistent with UTI. Will rx with ceftin. Send urine for culture. I think that her abdominal pain is most consistent with her UTI, however I discussed with the pt that if abdominal pain worsens or does not improve, will need CT abdomen given recent diverticulitis. She verbalizes understanding and agrees to call us if this occurs.

## 2012-02-17 NOTE — Assessment & Plan Note (Signed)
Hands. Recommeded otc hydrocortisone cream bid to affected areas and a good moisturizer.

## 2012-02-18 ENCOUNTER — Telehealth: Payer: Self-pay | Admitting: *Deleted

## 2012-02-18 DIAGNOSIS — R109 Unspecified abdominal pain: Secondary | ICD-10-CM

## 2012-02-18 NOTE — Telephone Encounter (Signed)
Abnormal dip does not always confirm infection.  I would like her to have a CT abd/pelvis Friday AM before work to evaluate her abdomen for diverticulitis before the weekend. This will also give [place] a look at her kidneys to make sure she does not have stones.

## 2012-02-18 NOTE — Telephone Encounter (Signed)
Notified pt of urine culture. She reports that she continues to have abdominal pain and tenderness to touch around her navel. Reports that she has 3 days of antibiotic left. She continues to take AZO for bladder spasms and reports that this helps. Pt is concerned as to why her urine culture came back normal when her dip was grossly positive? Pt states she will be home tomorrow until 12pm. She will be at work from 12 to 6:30 ([employer]) @ [phone].  Please advise.

## 2012-02-18 NOTE — Telephone Encounter (Signed)
Message copied by Kathi Simpers on Thu Feb 18, 2012  3:47 PM ------      Message from: O'SULLIVAN, MELISSA      Created: Thu Feb 18, 2012  8:14 AM       Pls call pt and let her know that her urine culture is negative.  Is she still having abdominal pain? If so, let me know and I will order CT abdomen.

## 2012-02-19 ENCOUNTER — Other Ambulatory Visit (HOSPITAL_BASED_OUTPATIENT_CLINIC_OR_DEPARTMENT_OTHER): Payer: Federal, State, Local not specified - PPO

## 2012-02-19 ENCOUNTER — Telehealth: Payer: Self-pay | Admitting: *Deleted

## 2012-02-19 ENCOUNTER — Ambulatory Visit (HOSPITAL_BASED_OUTPATIENT_CLINIC_OR_DEPARTMENT_OTHER)
Admission: RE | Admit: 2012-02-19 | Discharge: 2012-02-19 | Disposition: A | Discharge status: Home / Self Care | Payer: Federal, State, Local not specified - PPO | Source: Ambulatory Visit | Attending: Family | Admitting: Family

## 2012-02-19 ENCOUNTER — Encounter: Payer: Self-pay | Admitting: Family

## 2012-02-19 ENCOUNTER — Encounter (HOSPITAL_BASED_OUTPATIENT_CLINIC_OR_DEPARTMENT_OTHER): Payer: Self-pay

## 2012-02-19 DIAGNOSIS — R3129 Other microscopic hematuria: Secondary | ICD-10-CM | POA: Insufficient documentation

## 2012-02-19 DIAGNOSIS — K5730 Diverticulosis of large intestine without perforation or abscess without bleeding: Secondary | ICD-10-CM | POA: Insufficient documentation

## 2012-02-19 DIAGNOSIS — R109 Unspecified abdominal pain: Secondary | ICD-10-CM | POA: Insufficient documentation

## 2012-02-19 NOTE — Telephone Encounter (Signed)
Pt notified, CT has been arranged for this morning.

## 2012-02-19 NOTE — Telephone Encounter (Signed)
Message copied by Kathi Simpers on Fri Feb 19, 2012  5:23 PM ------      Message from: O'SULLIVAN, MELISSA      Created: Fri Feb 19, 2012 12:08 PM       Pls let pt know that CT does not show diverticulitis.  It does show constipation which could be contributing to her abdominal discomfort.  I recommend that she try miralax one cap once daily until BM's soft, then can stop and use PRN. Also, I would like her to repeat Urinalysis with reflex micro in lab in 2 weeks as there was microscopic blood in urine and I would like to make sure this clears.

## 2012-02-22 NOTE — Telephone Encounter (Signed)
Notified pt of below results on 02/19/12. Future urinalysis order entered and given to the lab.

## 2012-03-23 ENCOUNTER — Other Ambulatory Visit: Payer: Self-pay | Admitting: Internal Medicine

## 2012-03-23 MED ORDER — CITALOPRAM HYDROBROMIDE 40 MG PO TABS: ORAL_TABLET | ORAL | Status: DC

## 2012-03-23 NOTE — Telephone Encounter (Signed)
Refill- citalopram  40mg  tablets. TK 1 AND 1/2 TS PO D. Qty 45 last fill 2.25.14

## 2012-03-23 NOTE — Telephone Encounter (Signed)
OK to send citalopram.

## 2012-03-23 NOTE — Telephone Encounter (Signed)
I was attempting to send refill below and received these drug-drug warnings.  Please advise if ok to proceed with refill.  Current Warnings (3 unfiltered, 3 filtered) Associated Orders     High   Drug-Drug: citalopram and SUMAtriptan, rizatriptan  Co-administration of Serotonin Reuptake Blockers and Triptans may cause central nervous system toxicity, and rarely, serotonin syndrome. The FDA recommends that patients should be informed of the possibility of serotonin syndrome.  Details  Override Reason.Marland KitchenMarland KitchenBenefit outweighs riskDose AppropriateClinician ReviewedDefer to RPh  citalopram (CELEXA) 40 MG tablet  Prescription. New. Daum-term.   DiscontinueSUMAtriptan (IMITREX) 100 MG tablet  Prescription. Active.   Discontinuerizatriptan (MAXALT) 5 MG tablet  Prescription. Active. Buchinger-term.    High   Drug-Drug: omeprazole and citalopram  Plasma concentrations and toxic effects of Citalopram may be increased by concomitant administration of Omeprazole. Specifically, citalopram doses greater than 20 mg/day are not recommended in patients receiving Omeprazole according to official package labeling due to the risk of QT prolongation.  Details  Override Reason.Marland KitchenMarland KitchenBenefit outweighs riskDose AppropriateClinician ReviewedDefer to RPh  citalopram (CELEXA) 40 MG tablet  Prescription. New. Winberg-term.   omeprazole (PRILOSEC) 40 MG capsule  Prescription. Ended. Minkoff-term.    N/A   Age/Gender Warning: citalopram  Extreme caution for Patients 75 Years of Age and Older  Details  Override Reason.Marland KitchenMarland KitchenBenefit outweighs riskDose AppropriateClinician ReviewedDefer to [facility]

## 2012-03-23 NOTE — Telephone Encounter (Signed)
Refill sent.

## 2012-03-24 ENCOUNTER — Telehealth: Payer: Self-pay | Admitting: *Deleted

## 2012-03-24 NOTE — Telephone Encounter (Signed)
Pt left message requesting that we send copy of CT abdomen and pelvis to Dr Buccini's office as she has upcoming colonoscopy with him. Result faxed, notified pt.

## 2012-04-11 ENCOUNTER — Ambulatory Visit: Payer: Federal, State, Local not specified - PPO

## 2012-04-11 ENCOUNTER — Ambulatory Visit (INDEPENDENT_AMBULATORY_CARE_PROVIDER_SITE_OTHER): Payer: Federal, State, Local not specified - PPO | Admitting: Family Medicine

## 2012-04-11 ENCOUNTER — Other Ambulatory Visit: Payer: Self-pay | Admitting: Internal Medicine

## 2012-04-11 VITALS — BP 124/76 | HR 88 | Temp 99.0°F | Resp 17 | Ht 66.0 in | Wt 147.0 lb

## 2012-04-11 DIAGNOSIS — R51 Headache: Secondary | ICD-10-CM

## 2012-04-11 DIAGNOSIS — M549 Dorsalgia, unspecified: Secondary | ICD-10-CM

## 2012-04-11 DIAGNOSIS — G4700 Insomnia, unspecified: Secondary | ICD-10-CM

## 2012-04-11 MED ORDER — ZOLPIDEM TARTRATE 10 MG PO TABS: 5.0000 mg | ORAL_TABLET | Freq: Every evening | ORAL | Status: DC | PRN

## 2012-04-11 MED ORDER — CYCLOBENZAPRINE HCL 10 MG PO TABS: 10.0000 mg | ORAL_TABLET | Freq: Two times a day (BID) | ORAL | Status: DC | PRN

## 2012-04-11 MED ORDER — BUTALBITAL-APAP-CAFFEINE 50-325-40 MG PO TABS: 1.0000 | ORAL_TABLET | Freq: Four times a day (QID) | ORAL | Status: DC | PRN

## 2012-04-11 NOTE — Patient Instructions (Addendum)
You do appear to have arthritis in your lower back- especially at levels L2/L3 and L3/L4  Use the flexeril as needed for your back pain.  If you are not better in the next few days please let me know- Sooner if worse.   Remember that flexeril can cause sedation- do not use with other sedating medications such as xanax, ambien, fiorcet, librax, or narcotic pain medications    ambien dosage is now 5 mg for all female patients.

## 2012-04-11 NOTE — Progress Notes (Signed)
[facility] [address], [place] [address] [phone]  Date:  04/11/2012   Name:  Mandy Diaz   DOB:  [birth year]   MRN:  [id number]  PCP:  Letitia Libra, Ala Dach, MD    Chief Complaint: Back Pain and Leg Pain   History of Present Illness:  Mandy Diaz is a 62 y.o. very pleasant female patient who presents with the following:  She is here today to evaluate back pain Today is Monday.  On Saturday she fell while trying to get something in her closet- she fell onto her left lower back/ left buttock area.  She also hit her back on the doorframe when she fell. No head injury or LOC. She has tried ibuprofen 800 and fiorcet but these did not really help.  She has used ultram and flexeril in the past, but has not used these medications this time.  Her back feels "like a deep bruise."  She notes some radiaiton down her left thigh.  No numbness or weakness.    She also has complaint of pain in both of her thighs from the hip to the knee- she has noted this problem for about 2 months, NKI.  She thinks this could be related to her past dx of fibromyalgia.    She also needs a RF of her fiorcet and [place] today Patient Active Problem List  Diagnosis  . VITAMIN B12 DEFICIENCY  . 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 ANXIETY DEPRESSION  . MIGRAINE HEADACHE  . SINUS TACHYCARDIA  . CAROTID ARTERY DISEASE  . APHTHOUS STOMATITIS  . GERD  . IRRITABLE BOWEL SYNDROME  . ARTHRITIS, HANDS, BILATERAL  . MUSCLE SPASM, BACK  . OSTEOPENIA  . FREQUENCY, URINARY  . Vitamin d deficiency  . UTI (lower urinary tract infection)  . Eczema  . Microscopic hematuria    Past Medical History  Diagnosis Date  . Menopause   . GERD (gastroesophageal reflux disease)   . Urinary incontinence   . Migraine   . Depression   . Anxiety   . Hypertension   . Thyroid disease     Past Surgical History  Procedure Laterality Date  . Cholecystectomy      History  Substance Use  Topics  . Smoking status: Never Smoker   . Smokeless tobacco: Never Used  . Alcohol Use: No    Family History  Problem Relation Age of Onset  . Arthritis    . Diabetes      1st degree relative  . Hyperlipidemia    . Stroke      Female 1st degree relative <50  . Heart disease    . Cancer Mother   . Cancer Father   . Heart disease Father   . Diabetes Brother   . Heart disease Brother   . Heart disease Daughter     Allergies  Allergen Reactions  . Amoxicillin     REACTION: Rash  . Metoclopramide Hcl     REACTION: Affected nerves  . Penicillins     REACTION: Rash  . Sulfamethoxazole     REACTION: Sores in mouth    Medication list has been reviewed and updated.  Current Outpatient Prescriptions on File Prior to Visit  Medication Sig Dispense Refill  . ALOE VERA JUICE PO Take by mouth as needed.      . ALPRAZolam (XANAX) 0.5 MG tablet Take 1 tablet (0.5 mg total) by mouth 3 (three) times daily  as needed for sleep.  90 tablet  1  . atenolol (TENORMIN) 25 MG tablet Take 1 tablet (25 mg total) by mouth daily.  30 tablet  12  . buPROPion (WELLBUTRIN XL) 300 MG 24 hr tablet TAKE ONE TABLET BY MOUTH DAILY FOR DEPRESSION  30 tablet  5  . butalbital-acetaminophen-caffeine (FIORICET, ESGIC) 50-325-40 MG per tablet TAKE 1 TO 2 TABLETS BY MOUTH EVERY 4 HOURS AS NEEDED FOR TENSION HEADACHE. MAX 4 TABLETS PER DAY  100 tablet  11  . cefUROXime (CEFTIN) 500 MG tablet Take 1 tablet (500 mg total) by mouth 2 (two) times daily.  14 tablet  0  . cetirizine (ZYRTEC) 10 MG tablet Take 10 mg by mouth daily as needed.       . Cholecalciferol (VITAMIN D3) 3000 UNITS TABS Take 5,000 Units by mouth 2 (two) times daily.       . citalopram (CELEXA) 40 MG tablet TAKE 1 AND 1/2 TABLETS BY MOUTH DAILY  45 tablet  2  . clidinium-chlordiazePOXIDE (LIBRAX) 2.5-5 MG per capsule Take 1 capsule by mouth 4 (four) times daily -  before meals and at bedtime.  120 capsule  6  . cyanocobalamin (,VITAMIN B-12,) 1000  MCG/ML injection Inject 1 mL (1,000 mcg total) into the muscle every 14 (fourteen) days.  2 mL  12  . cyclobenzaprine (FLEXERIL) 10 MG tablet Take 1 tablet (10 mg total) by mouth 3 (three) times daily as needed for muscle spasms. 1 morning, mid-afternoon and bedtime for muscle spasm, take only 1, 2 if dizzy  90 tablet  5  . estradiol (ESTRACE) 1 MG tablet Take 1 mg by mouth daily.      Marland Kitchen ibuprofen (ADVIL,MOTRIN) 800 MG tablet Take 800 mg by mouth as needed.      . Misc Natural Products (TART CHERRY ADVANCED PO) Take 1,200 mg by mouth as needed.      Marland Kitchen NEEDLE, DISP, 25 G (B-D DISP NEEDLE 25GX1") 25G X 1" MISC        . NON FORMULARY Take 200 mg by mouth every morning. PREGNENOLONE (for memory and hormonal balance).      . NON FORMULARY Take 165 mg by mouth daily. ALL ADRENAL RAW.      Marland Kitchen NON FORMULARY Take 360 mg by mouth as needed. SLIPPERY ELM      . NON FORMULARY BROMELAIN & PAPAIN as needed for pain      . NON FORMULARY ANDROGRAPHIS EXTRACT 400mg  as needed for immunity.      Marland Kitchen ondansetron (ZOFRAN) 4 MG tablet Take 4-8 mg by mouth as directed. Every 4 to 6 hours as needed for nausea      . Probiotic Product (PROBIOTIC DAILY PO) Take 1 capsule by mouth daily.      . progesterone (PROMETRIUM) 100 MG capsule Take 100 mg by mouth At bedtime.      . rizatriptan (MAXALT) 5 MG tablet Take 1 tablet (5 mg total) by mouth as needed for migraine. May repeat in 2 hours if needed  10 tablet  0  . SUMAtriptan (IMITREX) 100 MG tablet TAKE 1 TABLET BY MOUTH AT ONSET OF HEADACHE, MAY REPEAT ONCE IF NEEDED  9 tablet  5  . Thyroid 48.75 MG TABS Take 1 tablet by mouth 2 (two) times daily.       . traMADol (ULTRAM) 50 MG tablet TAKE 1 TO 2 TABLETS BY MOUTH EVERY 4 HOURS AS NEEDED FOR PAIN  100 tablet  5  . valACYclovir (  VALTREX) 500 MG tablet Take 1 tablet (500 mg total) by mouth 3 (three) times daily. For viral infection  90 tablet  3  . zolpidem (AMBIEN) 10 MG tablet Take 1 tablet (10 mg total) by mouth at bedtime  as needed for sleep.  30 tablet  5  . [DISCONTINUED] DULoxetine (CYMBALTA) 30 MG capsule Take 1 tablet everyday for fibromyalgia.  30 capsule  11  . [DISCONTINUED] isometheptene-acetaminophen-dichloralphenazone (MIDRIN) 65-325-100 MG per capsule Take 1 capsule by mouth every 8 (eight) hours as needed. For headache       . [DISCONTINUED] omeprazole (PRILOSEC) 40 MG capsule Take 1 capsule (40 mg total) by mouth 2 (two) times daily as needed. For severe GERD  60 capsule  11   No current facility-administered medications on file prior to visit.    Review of Systems:  As per HPI- otherwise negative. States "no one else has been able to figure it out either " regarding her leg pain  Physical Examination: Filed Vitals:   04/11/12 1139  BP: 124/76  Pulse: 88  Temp: 99 F (37.2 C)  Resp: 17   Filed Vitals:   04/11/12 1139  Height: 5\' 6"  (1.676 m)  Weight: 147 lb (66.679 kg)   Body mass index is 23.74 kg/(m^2). Ideal Body Weight: Weight in (lb) to have BMI = 25: 154.6  GEN: WDWN, NAD, Non-toxic, A & O x 3 HEENT: Atraumatic, Normocephalic. Neck supple. No masses, No LAD.  Bilateral TM wnl, oropharynx normal.  PEERL,EOMI.   Ears and Nose: No external deformity. CV: RRR, No M/G/R. No JVD. No thrill. No extra heart sounds. PULM: CTA B, no wheezes, crackles, rhonchi. No retractions. No resp. distress. No accessory muscle use. EXTR: No c/c/e NEURO Normal gait.  PSYCH: Normally interactive. Conversant. Not depressed or anxious appearing.  Calm demeanor.  She is tender over her lower thoracic vertebrae, and over the muscles of her lower back/ lumbar spine.  Normal LE strength, sensation and DTR.  Negative SLR.  Good back flexion and extension  [facility] reading (PRIMARY) by  Dr. Patsy Lager. Thoracic spine: scoliosis, otherwise negative Lumbar spine: significant scoliosis, degenerative change especially at L2/3/4 levels LUMBAR SPINE - 2-3 VIEW  Comparison: None.  Findings: Moderate levoscoliosis  in the mid lumbar spine. Degenerative disc disease changes, most pronounced at L2-3. No fracture or acute subluxation. SI joints are symmetric. Prior cholecystectomy.  IMPRESSION: Levoscoliosis and degenerative changes. No acute findings.   THORACIC SPINE - 2 VIEW  Comparison: None.  Findings: There is rightward scoliosis in the mid to lower thoracic spine. No fracture or acute subluxation. Early degenerative spurring. Visualized ribs are intact.  IMPRESSION: Rightward scoliosis. No acute findings.   Assessment and Plan: Back pain - Plan: DG Thoracic Spine 2 View, DG Lumbar Spine 2-3 Views, cyclobenzaprine (FLEXERIL) 10 MG tablet  Insomnia - Plan: zolpidem (AMBIEN) 10 MG tablet  Persistent headaches - Plan: butalbital-acetaminophen-caffeine (FIORICET, ESGIC) 50-325-40 MG per tablet  Back strain and pain, but no apparent compression fracture.  Refilled her flexeril to use as needed for back pain.  Also refilled her fioret and ambien.  She is aware of her scoliosis, and discussed her degenerative changes.  She has used flexeril in combination with her other medications such as maxalt and celexa  in the past, and is aware of possible but rare drug interactions.  However, instructed her not to combine flexeril with any other sedating medications   See patient instructions for more details.    Meds ordered this  encounter  Medications  . cyclobenzaprine (FLEXERIL) 10 MG tablet    Sig: Take 1 tablet (10 mg total) by mouth 2 (two) times daily as needed for muscle spasms. 1 morning, mid-afternoon and bedtime for muscle spasm, take only 1, 2 if dizzy    Dispense:  30 tablet    Refill:  0  . butalbital-acetaminophen-caffeine (FIORICET, ESGIC) 50-325-40 MG per tablet    Sig: Take 1 tablet by mouth every 6 (six) hours as needed for headache.    Dispense:  30 tablet    Refill:  1  . zolpidem (AMBIEN) 10 MG tablet    Sig: Take 0.5 tablets (5 mg total) by mouth at bedtime as needed for  sleep.    Dispense:  30 tablet    Refill:  0     Signed Abbe Amsterdam, MD

## 2012-04-12 ENCOUNTER — Telehealth: Payer: Self-pay | Admitting: Internal Medicine

## 2012-04-12 MED ORDER — CITALOPRAM HYDROBROMIDE 40 MG PO TABS: ORAL_TABLET | ORAL | Status: DC

## 2012-04-12 NOTE — Telephone Encounter (Signed)
Refill sent, #45 x 2 refills.

## 2012-04-12 NOTE — Telephone Encounter (Signed)
Refill- citalopram 40mg  tablet. Take 1 and 1/2 tablets by mouth daily. Qty 45 last fill 2.19.14

## 2012-04-12 NOTE — Telephone Encounter (Signed)
Rx request to pharmacy/SLS  

## 2012-04-19 ENCOUNTER — Telehealth: Payer: Self-pay | Admitting: Internal Medicine

## 2012-04-19 MED ORDER — BUPROPION HCL ER (XL) 300 MG PO TB24: ORAL_TABLET | ORAL | Status: DC

## 2012-04-19 NOTE — Telephone Encounter (Signed)
Patient called in stating that she would like a refill of bupropion sent to [facility] on [address]

## 2012-04-19 NOTE — Telephone Encounter (Signed)
Rx request to pharmacy-*PATIENT NEEDS OFFICE VISIT*/SLS  

## 2012-04-19 NOTE — Telephone Encounter (Signed)
Bupropion request [Last Rx 09.03.13 #[id number]]/SLS Please advise.

## 2012-04-19 NOTE — Telephone Encounter (Signed)
OK to refill Bupropion 30 day supply with 3 rf. Warn her she needs to come in at some point because she  Has not been here in a while except for a UTI

## 2012-04-20 ENCOUNTER — Telehealth: Payer: Self-pay | Admitting: *Deleted

## 2012-04-20 NOTE — Telephone Encounter (Signed)
Received message from [place] at [facility] stating pt was recently prescribed Wellbutrin XL 300mg  at [facility]. Pt stated [facility] was unable to get medication due to a backorder issue and they are requesting new rx. Gave verbal to Fayrene Fearing for #30 x no refills as pt may get at [facility] when available in future as rx was sent to them on 04/19/12. Per 04/19/12 phone note, pt needs appt for routine follow up with [facility] or Dr Abner Greenspan. Please call pt to arrange.

## 2012-04-21 NOTE — Telephone Encounter (Signed)
Left detailed message informing patient that she needs to call our office to schedule physical

## 2012-05-15 ENCOUNTER — Other Ambulatory Visit: Payer: Self-pay | Admitting: Internal Medicine

## 2012-05-16 NOTE — Telephone Encounter (Signed)
Please advise re: pantoprazole refill as I do not see it on pt's medication list.

## 2012-05-23 ENCOUNTER — Other Ambulatory Visit: Payer: Self-pay

## 2012-05-23 MED ORDER — BUPROPION HCL ER (XL) 300 MG PO TB24: ORAL_TABLET | ORAL | Status: DC

## 2012-06-01 ENCOUNTER — Ambulatory Visit (INDEPENDENT_AMBULATORY_CARE_PROVIDER_SITE_OTHER): Payer: Federal, State, Local not specified - PPO | Admitting: Family Medicine

## 2012-06-01 ENCOUNTER — Encounter: Payer: Self-pay | Admitting: Family Medicine

## 2012-06-01 ENCOUNTER — Ambulatory Visit: Payer: Federal, State, Local not specified - PPO

## 2012-06-01 VITALS — BP 130/84 | HR 130 | Temp 98.0°F | Resp 26

## 2012-06-01 DIAGNOSIS — R42 Dizziness and giddiness: Secondary | ICD-10-CM

## 2012-06-01 DIAGNOSIS — L719 Rosacea, unspecified: Secondary | ICD-10-CM

## 2012-06-01 DIAGNOSIS — R002 Palpitations: Secondary | ICD-10-CM

## 2012-06-01 DIAGNOSIS — F341 Dysthymic disorder: Secondary | ICD-10-CM

## 2012-06-01 DIAGNOSIS — F329 Major depressive disorder, single episode, unspecified: Secondary | ICD-10-CM

## 2012-06-01 DIAGNOSIS — R51 Headache: Secondary | ICD-10-CM

## 2012-06-01 LAB — POCT UA - MICROSCOPIC ONLY
Casts, Ur, LPF, POC: NEGATIVE
Crystals, Ur, HPF, POC: NEGATIVE
Mucus, UA: NEGATIVE

## 2012-06-01 LAB — COMPREHENSIVE METABOLIC PANEL
ALT: 16 U/L (ref 0–35)
Alkaline Phosphatase: 49 U/L (ref 39–117)
CO2: 26 mEq/L (ref 19–32)
Sodium: 137 mEq/L (ref 135–145)
Total Bilirubin: 0.4 mg/dL (ref 0.3–1.2)
Total Protein: 6.8 g/dL (ref 6.0–8.3)

## 2012-06-01 LAB — TSH: TSH: 0.402 u[IU]/mL (ref 0.350–4.500)

## 2012-06-01 LAB — LIPID PANEL
LDL Cholesterol: 136 mg/dL — ABNORMAL HIGH (ref 0–99)
Total CHOL/HDL Ratio: 3.5 Ratio
VLDL: 45 mg/dL — ABNORMAL HIGH (ref 0–40)

## 2012-06-01 LAB — POCT URINALYSIS DIPSTICK
Glucose, UA: NEGATIVE
Protein, UA: NEGATIVE
Spec Grav, UA: 1.01
Urobilinogen, UA: 0.2
pH, UA: 6.5

## 2012-06-01 LAB — POCT CBC
Hemoglobin: 13.1 g/dL (ref 12.2–16.2)
Lymph, poc: 1.8 (ref 0.6–3.4)
MCHC: 31.1 g/dL — AB (ref 31.8–35.4)
MPV: 8.9 fL (ref 0–99.8)
POC Granulocyte: 9.2 — AB (ref 2–6.9)
POC MID %: 3 %M (ref 0–12)

## 2012-06-01 MED ORDER — TRETINOIN 0.025 % EX GEL: Freq: Every day | CUTANEOUS | Status: DC

## 2012-06-01 MED ORDER — CLONAZEPAM 0.5 MG PO TABS: 0.5000 mg | ORAL_TABLET | Freq: Two times a day (BID) | ORAL | Status: DC | PRN

## 2012-06-01 MED ORDER — ATENOLOL 25 MG PO TABS: 25.0000 mg | ORAL_TABLET | Freq: Two times a day (BID) | ORAL | Status: DC

## 2012-06-01 MED ORDER — ESCITALOPRAM OXALATE 20 MG PO TABS: 20.0000 mg | ORAL_TABLET | Freq: Every day | ORAL | Status: DC

## 2012-06-01 NOTE — Progress Notes (Signed)
[address]   [place], [place]  [address]   [phone]  Subjective:    Patient ID: Mandy Diaz, female    DOB: [birth year], 62 y.o.   MRN: [id number]  HPI This 62 y.o. female presents for evaluation of SOB, stress.  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  Two weeks ago, spoke with boss and broke down.  Supervisor sent home but was written up for leaving early.   Left work due to migraine the following day.  Called in twice over the weekend.  Now, pulse really elevating.  Will worsen with ambulation; pulse will increase to 130 to 160.  Feels shaky, weak, dizzy. Onset 1-2 weeks ago.  Strong family history of depression.  For past two months, suffering with severe depression; sleeping a lot.  Decreased concentration; unsteady gait.  Turning head will trigger dizziness; but also stumbles to the side; leans to the right.  Duration for two months.  +SI but would never carry out due to religion.   After work last week, smashed pots on back porch.  Feels stuck with depression.  Has not sought consultation for worsening depression.  Taking Wellbutrin, Celexa; was taking Cymbalta but was stopped with new physician.  Also seeing [facility] physician considered natural lithium.  Has been maintained on this regimen for seven years.  Cymbalta was added three years ago.  Lexapro did work well in the past but cannot recall why switched.  Last psychiatry consultation years ago.  Followed by Dr. Modena Nunnery for years; has seen other psychiatrists in past.  Rapid heart rate and palpitations has occurred in past with worsening depression; underwent extensive cardiac work up including holter monitor, echo.  Could not tolerate various B blockers due to side effects.  Lastly, requesting rx for Retin-A for rosacea.   PCP:  Scotty Court was PCP for 40 years; [place] in [facility]; previously in with [place] but now moved to [place]; two months ago,  had stomach issues and saw PA; diagnosed with diverticulitis.  S/p colonoscopy negative/OK.      Review of Systems  Constitutional: Positive for activity change, appetite change and fatigue. Negative for fever, chills and diaphoresis.  HENT: Negative for facial swelling, trouble swallowing and voice change.   Eyes: Negative for photophobia, pain, redness and visual disturbance.  Respiratory: Positive for shortness of breath and stridor. Negative for cough and wheezing.   Cardiovascular: Positive for chest pain and palpitations. Negative for leg swelling.  Gastrointestinal: Negative for abdominal pain.  Neurological: Positive for dizziness, light-headedness and headaches. Negative for tremors, seizures, syncope, facial asymmetry, speech difficulty, weakness and numbness.  Psychiatric/Behavioral: Positive for sleep disturbance, dysphoric mood, decreased concentration and agitation. Negative for suicidal ideas, hallucinations, behavioral problems, confusion and self-injury. The patient is nervous/anxious. The patient is not hyperactive.     Past Medical History  Diagnosis Date  . Menopause   . GERD (gastroesophageal reflux disease)   . Urinary incontinence   . Migraine   . Depression   . Anxiety   . Hypertension   . Thyroid disease     Past Surgical History  Procedure Laterality Date  . Cholecystectomy      Prior to Admission medications   Medication Sig Start Date End Date Taking? Authorizing Provider  ALOE VERA JUICE PO Take by mouth as needed.    Historical Provider, MD  ALPRAZolam Prudy Feeler) 0.5 MG tablet Take 1 tablet (  0.5 mg total) by mouth 3 (three) times daily as needed for sleep. 02/01/12   Edwyna Perfect, MD  atenolol (TENORMIN) 25 MG tablet Take 1 tablet (25 mg total) by mouth 2 (two) times daily. 06/01/12   Ethelda Chick, MD  buPROPion (WELLBUTRIN XL) 300 MG 24 hr tablet TAKE ONE TABLET BY MOUTH DAILY FOR DEPRESSION 05/23/12   Bradd Canary, MD    butalbital-acetaminophen-caffeine (FIORICET, ESGIC) [id number] MG per tablet Take 1 tablet by mouth every 6 (six) hours as needed for headache. 04/11/12   Pearline Cables, MD  cetirizine (ZYRTEC) 10 MG tablet Take 10 mg by mouth daily as needed.     Historical Provider, MD  Cholecalciferol (VITAMIN D3) 3000 UNITS TABS Take 5,000 Units by mouth 2 (two) times daily.  04/21/11   Historical Provider, MD  citalopram (CELEXA) 40 MG tablet TAKE 1 AND 1/2 TABLETS BY MOUTH DAILY 04/12/12   Sandford Craze, NP  clidinium-chlordiazePOXIDE (LIBRAX) 2.5-5 MG per capsule Take 1 capsule by mouth 4 (four) times daily -  before meals and at bedtime. 03/27/11   Edwyna Perfect, MD  clonazePAM (KLONOPIN) 0.5 MG tablet Take 1 tablet (0.5 mg total) by mouth 2 (two) times daily as needed for anxiety. 06/01/12   Ethelda Chick, MD  cyanocobalamin (,VITAMIN B-12,) 1000 MCG/ML injection Inject 1 mL (1,000 mcg total) into the muscle every 14 (fourteen) days. 03/27/11   Edwyna Perfect, MD  cyclobenzaprine (FLEXERIL) 10 MG tablet Take 1 tablet (10 mg total) by mouth 2 (two) times daily as needed for muscle spasms. 1 morning, mid-afternoon and bedtime for muscle spasm, take only 1, 2 if dizzy 04/11/12   Gwenlyn Found Copland, MD  escitalopram (LEXAPRO) 20 MG tablet Take 1 tablet (20 mg total) by mouth daily. 06/01/12   Ethelda Chick, MD  estradiol (ESTRACE) 1 MG tablet Take 1 mg by mouth daily.    Historical Provider, MD  ibuprofen (ADVIL,MOTRIN) 800 MG tablet Take 800 mg by mouth as needed.    Historical Provider, MD  metaxalone (SKELAXIN) 800 MG tablet Take 1 tablet (800 mg total) by mouth 3 (three) times daily. 06/07/12   Ethelda Chick, MD  Misc Natural Products (TART CHERRY ADVANCED PO) Take 1,200 mg by mouth as needed.    Historical Provider, MD  NEEDLE, DISP, 25 G (B-D DISP NEEDLE 25GX1") 25G X 1" MISC      Historical Provider, MD  NON FORMULARY Take 200 mg by mouth every morning. PREGNENOLONE (for memory and hormonal balance).     Historical Provider, MD  NON FORMULARY Take 165 mg by mouth daily. ALL ADRENAL RAW.    Historical Provider, MD  NON FORMULARY Take 360 mg by mouth as needed. SLIPPERY ELM    Historical Provider, MD  NON FORMULARY BROMELAIN & PAPAIN as needed for pain    Historical Provider, MD  NON FORMULARY ANDROGRAPHIS EXTRACT 400mg  as needed for immunity.    Historical Provider, MD  ondansetron (ZOFRAN) 4 MG tablet Take 4-8 mg by mouth as directed. Every 4 to 6 hours as needed for nausea 03/27/11   Edwyna Perfect, MD  pantoprazole (PROTONIX) 40 MG tablet TAKE 1 TABLET BY MOUTH DAILY 05/15/12   Sandford Craze, NP  Probiotic Product (PROBIOTIC DAILY PO) Take 1 capsule by mouth daily.    Historical Provider, MD  progesterone (PROMETRIUM) 100 MG capsule Take 100 mg by mouth At bedtime. 01/18/12   Historical Provider, MD  rizatriptan (MAXALT) 5 MG tablet  Take 1 tablet (5 mg total) by mouth as needed for migraine. May repeat in 2 hours if needed 01/22/12   Maurice March, MD  SUMAtriptan (IMITREX) 100 MG tablet TAKE 1 TABLET BY MOUTH AT ONSET OF HEADACHE, MAY REPEAT ONCE IF NEEDED 09/14/10   Damian Leavell., MD  Thyroid 48.75 MG TABS Take 1 tablet by mouth 2 (two) times daily.     Historical Provider, MD  tretinoin (RETIN-A) 0.025 % cream Apply topically at bedtime. 07/06/12   Ethelda Chick, MD  valACYclovir (VALTREX) 500 MG tablet Take 1 tablet (500 mg total) by mouth 3 (three) times daily. For viral infection 03/27/11   Edwyna Perfect, MD  zolpidem (AMBIEN) 10 MG tablet TAKE 1/2 TABLET AT BEDTIME AS NEEDED FOR SLEEP 07/04/12   Pearline Cables, MD    Allergies  Allergen Reactions  . Amoxicillin     REACTION: Rash  . Metoclopramide Hcl     REACTION: Affected nerves  . Penicillins     REACTION: Rash  . Sulfamethoxazole     REACTION: Sores in mouth    History   Social History  . Marital Status: Married    Spouse Name: N/A    Number of Children: N/A  . Years of Education: N/A    Occupational History  . Not on file.   Social History Main Topics  . Smoking status: Never Smoker   . Smokeless tobacco: Never Used  . Alcohol Use: No  . Drug Use: No  . Sexually Active: Yes    Birth Control/ Protection: None   Other Topics Concern  . Not on file   Social History Narrative   Regular exercise - NO          Family History  Problem Relation Age of Onset  . Arthritis    . Diabetes      1st degree relative  . Hyperlipidemia    . Stroke      Female 1st degree relative <50  . Heart disease    . Cancer Mother   . Cancer Father   . Heart disease Father   . Diabetes Brother   . Heart disease Brother   . Heart disease Daughter        Objective:   Physical Exam  Nursing note and vitals reviewed. Constitutional: She is oriented to person, place, and time. She appears well-developed and well-nourished. She appears distressed.  Mildly distressed; +anxious.  HENT:  Head: Normocephalic and atraumatic.  Right Ear: External ear normal.  Left Ear: External ear normal.  Nose: Nose normal.  Mouth/Throat: Oropharynx is clear and moist. No oropharyngeal exudate.  Eyes: Conjunctivae and EOM are normal. Pupils are equal, round, and reactive to light.  Neck: Normal range of motion. Neck supple. No JVD present. No thyromegaly present.  Cardiovascular: Regular rhythm, normal heart sounds and intact distal pulses.  Tachycardia present.  Exam reveals no gallop and no friction rub.   No murmur heard. Rate 110.  Pulmonary/Chest: Effort normal and breath sounds normal. No respiratory distress. She has no wheezes. She has no rales.  RR during exam 20.  Abdominal: Soft. Bowel sounds are normal. She exhibits no distension. There is no tenderness. There is no rebound and no guarding.  Musculoskeletal:       Cervical back: Normal.  Lymphadenopathy:    She has no cervical adenopathy.  Neurological: She is alert and oriented to person, place, and time. She has normal reflexes.  No cranial nerve  deficit. She exhibits normal muscle tone. Coordination normal.  Skin: Skin is warm and dry. No rash noted. She is not diaphoretic. No erythema. No pallor.  Psychiatric: Her behavior is normal. Thought content normal. Her mood appears anxious. Her affect is not angry, not blunt, not labile and not inappropriate. Her speech is tangential. Her speech is not rapid and/or pressured, not delayed and not slurred. Cognition and memory are normal. She does not exhibit a depressed mood. She is communicative.  Anxious in the room.        [facility] reading (PRIMARY) by  Dr. Katrinka Blazing. CXR:  NAD.  Results for orders placed in visit on 06/01/12  POCT UA - MICROSCOPIC ONLY      Result Value Range   WBC, Ur, HPF, POC neg     RBC, urine, microscopic 0-2     Bacteria, U Microscopic trace     Mucus, UA neg     Epithelial cells, urine per micros 2-3     Crystals, Ur, HPF, POC neg     Casts, Ur, LPF, POC neg     Yeast, UA neg    POCT URINALYSIS DIPSTICK      Result Value Range   Color, UA yellow     Clarity, UA cloudy     Glucose, UA neg     Bilirubin, UA neg     Ketones, UA neg     Spec Grav, UA 1.010     Blood, UA trace     pH, UA 6.5     Protein, UA neg     Urobilinogen, UA 0.2     Nitrite, UA neg     Leukocytes, UA Negative    POCT CBC      Result Value Range   WBC 11.4 (*) 4.6 - 10.2 K/uL   Lymph, poc 1.8  0.6 - 3.4   POC LYMPH PERCENT 15.9  10 - 50 %L   MID (cbc) 0.3  0 - 0.9   POC MID % 3.0  0 - 12 %M   POC Granulocyte 9.2 (*) 2 - 6.9   Granulocyte percent 81.1 (*) 37 - 80 %G   RBC 4.67  4.04 - 5.48 M/uL   Hemoglobin 13.1  12.2 - 16.2 g/dL   HCT, POC 13.0  86.5 - 47.9 %   MCV 90.2  80 - 97 fL   MCH, POC 28.1  27 - 31.2 pg   MCHC 31.1 (*) 31.8 - 35.4 g/dL   RDW, POC 78.4     Platelet Count, POC 354  142 - 424 K/uL   MPV 8.9  0 - 99.8 fL    EKG: NSR; rate 92.  No ST changes.  Assessment & Plan:  Dizzy - Plan: EKG 12-Lead, POCT UA - Microscopic Only, POCT urinalysis  dipstick, POCT CBC, Comprehensive metabolic panel, TSH, Lipid panel  Headache(784.0) - Plan: POCT UA - Microscopic Only, POCT urinalysis dipstick, POCT CBC, Comprehensive metabolic panel, TSH, Lipid panel  Palpitations - Plan: DG Chest 2 View  Anxiety and depression  Rosacea - Plan: DISCONTINUED: tretinoin (RETIN-A) 0.025 % gel   1. Dizziness:  New onset; associated with SOB, increase in anxiety. Obtain labs.  Normal EKG and CXR; normal neurological exam.  Not suggestive of vertigo.  Consistent with anxiety versus panic attacks.  Tachycardic upon presentation which appears associated with anxiety; dizziness may be secondary to tachycardia, hyperventilation.  Will rule out secondary causes by obtaining labs.  RTC for acute worsening. 2.  Headache: chronic history of migraines with recent headaches; likely secondary to worsening anxiety/depression; normal neurological exam in office.  No concerning features to headaches.  Obtain labs. 3.  Palpitations:  New. History of tachycardia and palpitations in past.  Pt refused referral to cardiology for further evaluation. Obtain labs.  EKG revealed NSR. CXR normal.  Recommend increasing Atenolol to 25mg  bid. 4.  Anxiety and depression: uncontrolled with recent worsening in past two months; advised to wean Celexa over the next three weeks and to start Lexapro; continue Wellbutrin at current dose. Rx for Clonazepam provided to use PRN anxiety.   Pt refused psychiatry referral and psychology referral at this time but encouraged to consider.  Counseled face to face for 45 minutes with 50% of time dedicated to counseling and coordination of care. 6.  Rosacea:  Chronic; rx for Retin-A provided. 7. SOB: New onset; occurs with palpitations, anxiety. Consistent with panic attack.  Normal EKG and CXR; obtain labs.  No respiratory distress during entire examination.  Meds ordered this encounter  Medications  . escitalopram (LEXAPRO) 20 MG tablet    Sig: Take 1  tablet (20 mg total) by mouth daily.    Dispense:  30 tablet    Refill:  5  . atenolol (TENORMIN) 25 MG tablet    Sig: Take 1 tablet (25 mg total) by mouth 2 (two) times daily.    Dispense:  60 tablet    Refill:  6  . DISCONTD: clonazePAM (KLONOPIN) 0.5 MG tablet    Sig: Take 1 tablet (0.5 mg total) by mouth 2 (two) times daily as needed for anxiety.    Dispense:  40 tablet    Refill:  1  . clonazePAM (KLONOPIN) 0.5 MG tablet    Sig: Take 1 tablet (0.5 mg total) by mouth 2 (two) times daily as needed for anxiety.    Dispense:  40 tablet    Refill:  1  . DISCONTD: tretinoin (RETIN-A) 0.025 % gel    Sig: Apply topically at bedtime.    Dispense:  45 g    Refill:  1

## 2012-06-02 ENCOUNTER — Telehealth: Payer: Self-pay

## 2012-06-02 NOTE — Telephone Encounter (Signed)
Please advise on Flexeril, patient wants something less sedating, also how [place] should she not work?

## 2012-06-02 NOTE — Telephone Encounter (Signed)
Patient states that when she last in for an OV it was suggested that she take some time off work to see how her new meds would affect her. She would like an out-of-work note but did not specify for how [employer] she would need to be written out. Pt also states that her muscle relaxer Flexeril makes her feel too relaxed and would like something different. Pt can be reached at [phone] or [phone]

## 2012-06-07 MED ORDER — METAXALONE 800 MG PO TABS: 800.0000 mg | ORAL_TABLET | Freq: Three times a day (TID) | ORAL | Status: DC

## 2012-06-07 NOTE — Telephone Encounter (Signed)
As discussed at her visit, I recommended her take a week off of work. Pt reported that she works part time, so she is either "put on the schedule or not".  She does not have dedicated vacation time.  If she needs a note for work, recommend out of work for one week.  I will send in rx for Skelaxin which is less sedating.

## 2012-06-08 ENCOUNTER — Telehealth: Payer: Self-pay | Admitting: Radiology

## 2012-06-08 NOTE — Telephone Encounter (Signed)
Thanks. Note provided. I called patient to advise, her phone cut out. Called her back and she was advised.

## 2012-06-08 NOTE — Telephone Encounter (Signed)
Patient states she has increased sluggishness, wants to know if this could be due to the increased dose of Atenolol, please advise.

## 2012-06-08 NOTE — Telephone Encounter (Signed)
Yes, increased fatigue may be due to increase in Atenolol. If so, fatigue will usually improve in upcoming 1-2 weeks.  We also made several other medication changes during the visit.  Additionally, patient also having a difficult time emotionally which can contribute to fatigue/sluggishness.  All of these factors may be contributing to her fatigue/sluggishness.

## 2012-06-08 NOTE — Telephone Encounter (Signed)
I saw patient on 5/14; thus, I recommended taking one week off of work; she also had called into work for a few days before the visit on 5/14.  Thus, you can approve out of work note for 5/10-5/21/14 only.  06/08/12 will be one week after her visit with me.  Thanks!

## 2012-06-08 NOTE — Telephone Encounter (Signed)
Called her. She is advised of lab results she wants work note for 05/28/12 through 06/10/12. Is this okay to give note for this length of time?

## 2012-06-09 ENCOUNTER — Telehealth: Payer: Self-pay

## 2012-06-09 NOTE — Telephone Encounter (Signed)
Pt is calling saying that since she is returning back to work after being out she has been up moving around more and when she does she becomes dizzy this has been going on for past couple Of days. Call back number is [phone]

## 2012-06-09 NOTE — Telephone Encounter (Signed)
Advised patient to come in for this, since she is worsening. She agrees.

## 2012-06-09 NOTE — Telephone Encounter (Signed)
Pt is calling back saying that her dizziness has gotten worse over past couple of days and wants to know what she can do Call back number is [phone]

## 2012-06-13 ENCOUNTER — Telehealth: Payer: Self-pay

## 2012-06-13 NOTE — Telephone Encounter (Signed)
Spoke w/pt who stated Dr Katrinka Blazing had RXd the tretinoin cream for rosacea and she also gets a type of very deep and sore acne occasionally which she had when she was here at [facility]. She has tried samples of some different topicals in the past given to her by dermatologist but does not remember the names. Checked w/Dr Katrinka Blazing who stated we can use adult acne as well as rosacea as Dxs on PA. Sent in form.

## 2012-06-29 ENCOUNTER — Telehealth: Payer: Self-pay

## 2012-06-29 NOTE — Telephone Encounter (Signed)
Call pt and find out what side effect she is having, which medication?

## 2012-06-29 NOTE — Telephone Encounter (Signed)
PT STATES SHE WAS PUT ON AN ANTIDEPRESSANT MEDICINE AND IS HAVING SOME SIDE EFFECTS FROM IT. WOULD LIKE TO KNOW THE PROTOCOL FOR GETTING OFF THE MEDICINE. PLEASE CALL [phone] OR HER CELL AT [phone]

## 2012-06-29 NOTE — Telephone Encounter (Signed)
Dr Katrinka Blazing, would you like me to tell pt to RTC to discuss this?

## 2012-06-30 NOTE — Telephone Encounter (Signed)
Spoke with pt, she is having eye bone pain, jaw pain, upper teeth, acid reflux, stomach cramps, indigestion, and TMJ from Lexapro.

## 2012-07-04 ENCOUNTER — Other Ambulatory Visit: Payer: Self-pay | Admitting: Family Medicine

## 2012-07-04 DIAGNOSIS — G4700 Insomnia, unspecified: Secondary | ICD-10-CM

## 2012-07-05 NOTE — Telephone Encounter (Signed)
Please return patient call --- recommend her decreasing Lexapro to 1/2 tablet daily until follow-up appointment with me.  Let's see if she can tolerate a lower dose of Lexapro.

## 2012-07-06 MED ORDER — TRETINOIN 0.025 % EX CREA: TOPICAL_CREAM | Freq: Every day | CUTANEOUS | Status: DC

## 2012-07-06 NOTE — Telephone Encounter (Signed)
Patient advised. She is asking for Rx for Retin A cream instead of gel, this is done for her.

## 2012-07-07 ENCOUNTER — Telehealth: Payer: Self-pay

## 2012-07-07 NOTE — Telephone Encounter (Signed)
This was taken care of yesterday- called [facility] to confirm

## 2012-07-07 NOTE — Telephone Encounter (Signed)
[facility] requests refill on Zolpidem 10 mg.

## 2012-07-08 ENCOUNTER — Telehealth: Payer: Self-pay | Admitting: *Deleted

## 2012-07-08 NOTE — Telephone Encounter (Signed)
Retin-A Cream has been approved from 05/08/12 thru 07/08/12. Approval sent to pharmacy.

## 2012-08-15 ENCOUNTER — Encounter: Payer: Self-pay | Admitting: Family Medicine

## 2012-08-15 ENCOUNTER — Ambulatory Visit (INDEPENDENT_AMBULATORY_CARE_PROVIDER_SITE_OTHER): Payer: Federal, State, Local not specified - PPO | Admitting: Family Medicine

## 2012-08-15 VITALS — BP 131/71 | HR 96 | Resp 16 | Ht 65.0 in | Wt 145.0 lb

## 2012-08-15 DIAGNOSIS — R413 Other amnesia: Secondary | ICD-10-CM

## 2012-08-15 DIAGNOSIS — R51 Headache: Secondary | ICD-10-CM

## 2012-08-15 DIAGNOSIS — J0100 Acute maxillary sinusitis, unspecified: Secondary | ICD-10-CM

## 2012-08-15 DIAGNOSIS — F341 Dysthymic disorder: Secondary | ICD-10-CM

## 2012-08-15 DIAGNOSIS — N951 Menopausal and female climacteric states: Secondary | ICD-10-CM

## 2012-08-15 DIAGNOSIS — R197 Diarrhea, unspecified: Secondary | ICD-10-CM

## 2012-08-15 DIAGNOSIS — F329 Major depressive disorder, single episode, unspecified: Secondary | ICD-10-CM

## 2012-08-15 DIAGNOSIS — F419 Anxiety disorder, unspecified: Secondary | ICD-10-CM

## 2012-08-15 LAB — CBC WITH DIFFERENTIAL/PLATELET
Basophils Absolute: 0 10*3/uL (ref 0.0–0.1)
Eosinophils Relative: 0 % (ref 0–5)
Lymphocytes Relative: 22 % (ref 12–46)
Neutro Abs: 5.7 10*3/uL (ref 1.7–7.7)
Platelets: 322 10*3/uL (ref 150–400)
RDW: 14.3 % (ref 11.5–15.5)
WBC: 7.7 10*3/uL (ref 4.0–10.5)

## 2012-08-15 LAB — COMPREHENSIVE METABOLIC PANEL
Alkaline Phosphatase: 49 U/L (ref 39–117)
Glucose, Bld: 96 mg/dL (ref 70–99)
Sodium: 140 mEq/L (ref 135–145)
Total Bilirubin: 0.3 mg/dL (ref 0.3–1.2)
Total Protein: 6.8 g/dL (ref 6.0–8.3)

## 2012-08-15 LAB — POCT SEDIMENTATION RATE: POCT SED RATE: 16 mm/hr (ref 0–22)

## 2012-08-15 NOTE — Progress Notes (Signed)
[address]   [place], [place]  [address]   [phone]  Subjective:    Patient ID: Mandy Diaz, female    DOB: Dec 04, 1950, 62 y.o.   MRN: [id number]  HPI This 62 y.o. female presents for two month follow-up for the following:  1.  Anxiety with depression: two month follow-up; management at last visit included changing Celexa 60mg  to Lexapro therapy; suffered with horrible sinus pain, increased appetite on medication; advised to decrease to 1/2 tablet but symptoms persisted so she stopped medication. Restarted Celexa at 40mg  one tablet daily; did not increase back to 60mg  daily; compliance with Wellbutrin XL 300mg  daily.  Taking Xanax or Clonazepam as needed; usually takes three days per week.  Admits to excessive crying; also suffers with excessive anxiety.  Has tremendous anxiety regarding work; will get a headache and nausea an increasing tremor and nervousness for 1-2 days before going to work. Asks to only work four hours per shift.  Considering quitting current job due to stress and demands of remembering various things at work.  Calls out a lot.  Taking next week off to take a break from work; has worked for [employer] of [place] for seven years.  Integrative therapy physician has written a note stating that patient suffers from anxiety and should avoid stressful work environments.  Finds self escaping to room and getting in bed to deal with anxiety; son accuses her of hiding from her issues.  Declined referral to psychiatry at last visit.  Still reluctant.  Interested in note from MD stating that work too stressful.    2.  HA: have worsened lately and are different from typical migraine or stress headaches.  B periorbital headaches with sinus tenderness, teeth tenderness.  No associated photophobia, phonophobia, nausea.  Has headache daily.  Lots of pressure in head, eyes, sinuses but denies nasal congestion, rhinorrhea, ear pain, sore throat.  +PND intermittently.  No vision changes, n/v,  photophobia, phonophobia, dizziness.  Decreased memory for past several months.  Mother with dementia; worried about early dementia.  In past few years, integrative therapist did allergy testing; patient allergic to foods with yeast; cut out all foods with yeast; has lost weight but headaches did get better.  3.  Diarrhea; onset in past few weeks.  Having daily diarrhea; will be watery and then will be like string beans (narrow and thin).  S/p colonoscopy by Buccini four months ago.  Had suffered with recent diverticulitis.  +chronic IBS diarrhea predominant.  No LLQ pain like with diverticulitis.  No recent antibiotics.  No recent travel.    4.  Thyroid problems:  S/p consultation by Integrative Therapy physician; ran studies and advised patient that thyroid not normal because adrenal glands not working well.  Placed on estrogen and progesterone supplements; also started thyroid natural supplement.  Since starting progesterone, hair has started falling out.  Would like to return to [facility]; has suffered with chronic severe hot flashes.  No breast cancer in family.  Father and brother with CAD but both smokers.  Patient has never smoked.  Review of Systems  Constitutional: Negative for fever, chills, diaphoresis and fatigue.  HENT: Positive for postnasal drip. Negative for ear pain, congestion, sore throat, rhinorrhea, trouble swallowing and voice change.   Respiratory: Negative for cough, shortness of breath, wheezing and stridor.   Cardiovascular: Negative for chest pain, palpitations and leg swelling.  Gastrointestinal: Positive for diarrhea. Negative for nausea, vomiting, abdominal pain, constipation, blood in stool, abdominal distention  and anal bleeding.  Skin: Negative for rash and wound.  Neurological: Positive for headaches. Negative for dizziness, tremors, seizures, syncope, facial asymmetry, speech difficulty, weakness, light-headedness and numbness.  Psychiatric/Behavioral: Positive for  sleep disturbance, dysphoric mood and decreased concentration. Negative for suicidal ideas and self-injury. The patient is nervous/anxious.    Past Medical History  Diagnosis Date  . Menopause   . GERD (gastroesophageal reflux disease)   . Urinary incontinence   . Migraine   . Depression   . Anxiety   . Hypertension   . Thyroid disease    Current Outpatient Prescriptions on File Prior to Visit  Medication Sig Dispense Refill  . ALOE VERA JUICE PO Take by mouth as needed.      . ALPRAZolam (XANAX) 0.5 MG tablet Take 1 tablet (0.5 mg total) by mouth 3 (three) times daily as needed for sleep.  90 tablet  1  . atenolol (TENORMIN) 25 MG tablet Take 1 tablet (25 mg total) by mouth 2 (two) times daily.  60 tablet  6  . buPROPion (WELLBUTRIN XL) 300 MG 24 hr tablet TAKE ONE TABLET BY MOUTH DAILY FOR DEPRESSION  30 tablet  3  . butalbital-acetaminophen-caffeine (FIORICET, ESGIC) 50-325-40 MG per tablet Take 1 tablet by mouth every 6 (six) hours as needed for headache.  30 tablet  1  . cetirizine (ZYRTEC) 10 MG tablet Take 10 mg by mouth daily as needed.       . Cholecalciferol (VITAMIN D3) 3000 UNITS TABS Take 5,000 Units by mouth 2 (two) times daily.       . citalopram (CELEXA) 40 MG tablet TAKE 1 AND 1/2 TABLETS BY MOUTH DAILY  45 tablet  2  . clidinium-chlordiazePOXIDE (LIBRAX) 2.5-5 MG per capsule Take 1 capsule by mouth 4 (four) times daily -  before meals and at bedtime.  120 capsule  6  . clonazePAM (KLONOPIN) 0.5 MG tablet Take 1 tablet (0.5 mg total) by mouth 2 (two) times daily as needed for anxiety.  40 tablet  1  . cyanocobalamin (,VITAMIN B-12,) 1000 MCG/ML injection Inject 1 mL (1,000 mcg total) into the muscle every 14 (fourteen) days.  2 mL  12  . cyclobenzaprine (FLEXERIL) 10 MG tablet Take 1 tablet (10 mg total) by mouth 2 (two) times daily as needed for muscle spasms. 1 morning, mid-afternoon and bedtime for muscle spasm, take only 1, 2 if dizzy  30 tablet  0  . estradiol  (ESTRACE) 1 MG tablet Take 1 mg by mouth daily.      Marland Kitchen ibuprofen (ADVIL,MOTRIN) 800 MG tablet Take 800 mg by mouth as needed.      . metaxalone (SKELAXIN) 800 MG tablet Take 1 tablet (800 mg total) by mouth 3 (three) times daily.  60 tablet  3  . Misc Natural Products (TART CHERRY ADVANCED PO) Take 1,200 mg by mouth as needed.      Marland Kitchen NEEDLE, DISP, 25 G (B-D DISP NEEDLE 25GX1") 25G X 1" MISC        . NON FORMULARY Take 200 mg by mouth every morning. PREGNENOLONE (for memory and hormonal balance).      . NON FORMULARY Take 165 mg by mouth daily. ALL ADRENAL RAW.      Marland Kitchen NON FORMULARY Take 360 mg by mouth as needed. SLIPPERY ELM      . NON FORMULARY BROMELAIN & PAPAIN as needed for pain      . NON FORMULARY ANDROGRAPHIS EXTRACT 400mg  as needed for immunity.      Marland Kitchen  ondansetron (ZOFRAN) 4 MG tablet Take 4-8 mg by mouth as directed. Every 4 to 6 hours as needed for nausea      . pantoprazole (PROTONIX) 40 MG tablet TAKE 1 TABLET BY MOUTH DAILY  30 tablet  2  . Probiotic Product (PROBIOTIC DAILY PO) Take 1 capsule by mouth daily.      . progesterone (PROMETRIUM) 100 MG capsule Take 100 mg by mouth At bedtime.      . rizatriptan (MAXALT) 5 MG tablet Take 1 tablet (5 mg total) by mouth as needed for migraine. May repeat in 2 hours if needed  10 tablet  0  . SUMAtriptan (IMITREX) 100 MG tablet TAKE 1 TABLET BY MOUTH AT ONSET OF HEADACHE, MAY REPEAT ONCE IF NEEDED  9 tablet  5  . Thyroid 48.75 MG TABS Take 1 tablet by mouth 2 (two) times daily.       Marland Kitchen tretinoin (RETIN-A) 0.025 % cream Apply topically at bedtime.  45 g  1  . valACYclovir (VALTREX) 500 MG tablet Take 1 tablet (500 mg total) by mouth 3 (three) times daily. For viral infection  90 tablet  3  . zolpidem (AMBIEN) 10 MG tablet TAKE 1/2 TABLET AT BEDTIME AS NEEDED FOR SLEEP  30 tablet  0  . escitalopram (LEXAPRO) 20 MG tablet Take 1 tablet (20 mg total) by mouth daily.  30 tablet  5  . [DISCONTINUED] DULoxetine (CYMBALTA) 30 MG capsule Take 1  tablet everyday for fibromyalgia.  30 capsule  11  . [DISCONTINUED] isometheptene-acetaminophen-dichloralphenazone (MIDRIN) 65-325-100 MG per capsule Take 1 capsule by mouth every 8 (eight) hours as needed. For headache       . [DISCONTINUED] omeprazole (PRILOSEC) 40 MG capsule Take 1 capsule (40 mg total) by mouth 2 (two) times daily as needed. For severe GERD  60 capsule  11   No current facility-administered medications on file prior to visit.   History   Social History  . Marital Status: Married    Spouse Name: N/A    Number of Children: N/A  . Years of Education: N/A   Occupational History  . Not on file.   Social History Main Topics  . Smoking status: Never Smoker   . Smokeless tobacco: Never Used  . Alcohol Use: No  . Drug Use: No  . Sexually Active: Yes    Birth Control/ Protection: None   Other Topics Concern  . Not on file   Social History Narrative   Regular exercise - NO         Family History  Problem Relation Age of Onset  . Arthritis    . Diabetes      1st degree relative  . Hyperlipidemia    . Stroke      Female 1st degree relative <50  . Heart disease    . Cancer Mother   . Cancer Father   . Heart disease Father   . Diabetes Brother   . Heart disease Brother   . Heart disease Daughter        Objective:   Physical Exam  Nursing note and vitals reviewed. Constitutional: She is oriented to person, place, and time. She appears well-developed and well-nourished. No distress.  HENT:  Head: Normocephalic and atraumatic.  Right Ear: External ear normal.  Left Ear: External ear normal.  Nose: Right sinus exhibits maxillary sinus tenderness. Left sinus exhibits maxillary sinus tenderness.  Mouth/Throat: Oropharynx is clear and moist.  Eyes: Conjunctivae and EOM are normal. Pupils  are equal, round, and reactive to light.  Neck: Normal range of motion. Neck supple. No thyromegaly present.  Cardiovascular: Normal rate, regular rhythm, normal heart  sounds and intact distal pulses.  Exam reveals no gallop and no friction rub.   No murmur heard. Pulmonary/Chest: Effort normal and breath sounds normal. She has no wheezes. She has no rales.  Abdominal: Soft. Bowel sounds are normal. She exhibits no distension. There is no tenderness. There is no rebound and no guarding.  Lymphadenopathy:    She has no cervical adenopathy.  Neurological: She is alert and oriented to person, place, and time. No cranial nerve deficit. She exhibits normal muscle tone. Coordination normal.  Skin: Skin is warm and dry. No rash noted. She is not diaphoretic.  Psychiatric: She has a normal mood and affect. Her behavior is normal.  MMSE OF 30/30.       Assessment & Plan:  Anxiety and depression  Diarrhea - Plan: Comprehensive metabolic panel, Stool culture, Clostridium difficile EIA  Headache(784.0) - Plan: POCT SEDIMENTATION RATE, MR Brain W Wo Contrast  Sinusitis, acute maxillary - Plan: CBC with Differential  Memory loss - Plan: T4, free, TSH  Menopausal hot flushes    1. Anxiety and depression: uncontrolled and severe.  Start taking Clonazepam 0.5mg  1/2 bid scheduled.  Continue Celexa 40mg  and Wellbutrin XL 300mg  daily; recommend psychiatry consultation and patient to consider.  Close follow-up in one month; will not write letter to employer. 2.  Diarrhea:  New onset.  S/p colonoscopy four months ago; obtain labs and stools cultures and C. Difficile.  BRAT diet, hdyration.   3.  Headaches: worsening; history of migraines yet current headaches different and daily. Normal neurological exam.  Obtain ESR and MRI brain.   4.  Acute Maxillary Sinusitis:  New. Rx for Doxycycline provided to complete. 5.  Memory loss:  New.  MMSE of 30/30; reassurance; mother with dementia; obtain MRI brain. 6. Menopausal hot flashes: persistent; counseled on risk and benefits of HRT; pt requesting lowest dose Prempro despite family history of CAD.   7. 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  No orders of the defined types were placed in this encounter.

## 2012-08-15 NOTE — Patient Instructions (Addendum)
1.  TAKE CLONAZEPAM 0.5MG  1/2 TWICE DAY SCHEDULED. 2.  RETURN STOOL STUDIES UPON COMPLETION (AFTER COLLECTION OF STOOL SAMPLE AT HOME). 3.  WE WILL CALL YOU IN THE UPCOMING WEEK WITH APPOINTMENT FOR MRI BRAIN.

## 2012-08-15 NOTE — Addendum Note (Signed)
Addended by: Mervin Kung on: 08/15/2012 04:01 PM   Modules accepted: Orders

## 2012-08-16 LAB — CLOSTRIDIUM DIFFICILE EIA: CDIFTX: NEGATIVE

## 2012-08-19 ENCOUNTER — Telehealth: Payer: Self-pay | Admitting: Radiology

## 2012-08-19 LAB — STOOL CULTURE

## 2012-08-19 NOTE — Progress Notes (Signed)
PA approved for ambien through 08/17/13. Notified pharmacy.

## 2012-08-19 NOTE — Telephone Encounter (Signed)
Spoke to patient and advised of lab results, she is scheduled for the scan today. Patient continues with fever and has had slight decrease in diarrhea. She wants to know what is next step in light of normal labs please advise.

## 2012-08-22 NOTE — Telephone Encounter (Signed)
If diarrhea persists more than one more week, recommend appointment with Dr. Matthias Hughs.  How high is her fever?  When was last fever?  How many stools per day is she having now?

## 2012-08-23 ENCOUNTER — Ambulatory Visit
Admission: RE | Admit: 2012-08-23 | Discharge: 2012-08-23 | Disposition: A | Discharge status: Home / Self Care | Payer: Federal, State, Local not specified - PPO | Source: Ambulatory Visit | Attending: Family Medicine | Admitting: Family Medicine

## 2012-08-23 DIAGNOSIS — R51 Headache: Secondary | ICD-10-CM

## 2012-08-23 MED ORDER — GADOBENATE DIMEGLUMINE 529 MG/ML IV SOLN
13.0000 mL | Freq: Once | INTRAVENOUS | Status: AC | PRN
  Administered 2012-08-23: 13 mL via INTRAVENOUS

## 2012-08-23 NOTE — Telephone Encounter (Signed)
Thanks. I have called patient to advise. She is not running a fever any longer she states it has stopped (yesterday). She states she is improving now, diarrhea not as bad, seems to be resolving. She will call me back if she needs to be referred.

## 2012-09-14 ENCOUNTER — Telehealth: Payer: Self-pay

## 2012-09-14 NOTE — Telephone Encounter (Signed)
Please advise, I see MRI brain ordered, but no referral. Office visit note from 08/15/12 indicates 1. Anxiety and depression: uncontrolled and severe. Start taking Clonazepam 0.5mg  1/2 bid scheduled. Continue Celexa 40mg  and Wellbutrin XL 300mg  daily; recommend psychiatry consultation and patient to consider. Also indicates 7. Thyroid and possible adrenal pathology: New; diagnosed by Integrative Therapy specialist; recommended and offered endocrinology referral to review labs and to provide with further input; pt to consider.  Called her, which referral is she inquiring about, she is asking about the psychiatric referral. She wants to know if you know any one for her son, he is 66. Has had situational depression (divorce/ death of friend/ cousin)

## 2012-09-14 NOTE — Telephone Encounter (Signed)
Patient saw Dr.Smith and was referred to another doctor, she can't remember who that doctor is. Please call at [phone].

## 2012-09-21 ENCOUNTER — Other Ambulatory Visit: Payer: Self-pay | Admitting: Family Medicine

## 2012-09-23 NOTE — Telephone Encounter (Signed)
Recommend Emerson Monte who is excellent psychiatrist in [place].

## 2012-09-23 NOTE — Telephone Encounter (Signed)
Clld pt - LMOVM of cell advising Dr. Michaelle Copas recommendation of Emerson Monte, psychiatrist in [place] for her son.

## 2012-09-26 ENCOUNTER — Other Ambulatory Visit: Payer: Self-pay | Admitting: *Deleted

## 2012-09-26 NOTE — Telephone Encounter (Signed)
Faxed refill request received from pharmacy for Citalopram 40 mg Last filled by MD on 03.25.14, #30x2 Last AEX - 03.08.2013 [[facility]] Pt sees [facility] [address] [place], [place]; who has been managing her Anxiety & Depression, also has been managing her medications, including Citalopram. Rx request DENIED per [facility] refill protocol/SLS

## 2012-09-28 ENCOUNTER — Encounter: Payer: Self-pay | Admitting: Family Medicine

## 2012-09-28 ENCOUNTER — Ambulatory Visit (INDEPENDENT_AMBULATORY_CARE_PROVIDER_SITE_OTHER): Payer: Federal, State, Local not specified - PPO | Admitting: Family Medicine

## 2012-09-28 VITALS — BP 130/78 | HR 95 | Temp 99.5°F | Resp 16 | Ht 65.0 in | Wt 143.8 lb

## 2012-09-28 DIAGNOSIS — F341 Dysthymic disorder: Secondary | ICD-10-CM

## 2012-09-28 DIAGNOSIS — R197 Diarrhea, unspecified: Secondary | ICD-10-CM

## 2012-09-28 DIAGNOSIS — J069 Acute upper respiratory infection, unspecified: Secondary | ICD-10-CM

## 2012-09-28 DIAGNOSIS — G43909 Migraine, unspecified, not intractable, without status migrainosus: Secondary | ICD-10-CM

## 2012-09-28 MED ORDER — AZITHROMYCIN 250 MG PO TABS: ORAL_TABLET | ORAL | Status: DC

## 2012-09-28 NOTE — Progress Notes (Signed)
[address]   [place], [place]  [address]   [phone]  Subjective:    Patient ID: Mandy Diaz, female    DOB: 1950/11/24, 62 y.o.   MRN: [id number]  HPI This 62 y.o. female presents for six week follow-up of the following:  1.  Anxiety with depression:  Depression has improved; no longer in the bed all the time.  Having some good days.  Having anxiety mostly.  Memory is gone. Son was at [employer]; dean's list at [employer]; transferred to [employer] college because married.  Son filed for divorce.  Wife's brother is divorce attorney.  Son just wanted out; had to move back home. 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  Son bought car and then lost job.  Then came down with virus in 04/2012.  Current girlfriend is a Engineer, civil (consulting); took to [facility].  Started spiraling.  Did not go for exams.  Went to counselor to withdraw.  Emotionally in terrible shape.  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  Micah Flesher to three sessions.  Referred to [facility]; diagnosed pt as bipolar.  Recommended stopping Seroquel.   Saw Sharlette Dense in high school; diagnosed with ADHD; has not taken medication for ADHD in H.S. And college.  He cannot get into [employer] due to horrible grade point average.  Has gone to school for five years.  Still going to [employer].  Has lots of anger.  Now he is job Architect.  After [facility] consultation, no follow-up visit.  Called yesterday, closest appointment in three weeks.  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  Went to apply for [employer] but was denied due to ADD. Main focus is work right now.  Can only focus on so much currently.   Did not make appointment with psychiatry or therapist after last visit; focus has been on son; unable to focus on self at this time.  No SI/HI.   2. Migraines: will be written up if misses work or leaves early.  [facility]; no benefit from management at [facility].  Not interested  in returning. S/p MRI brain after last visit due to worsening headaches that were different than typical migraines.    3.  Sinus congestion: +PND; +ST; no sneezing; low grade fever.  No chills or sweats but +malaise.  +intermittent R ear pain.  No cough.  Clearing of throat.  Sinus congestion.  4. Diarrhea; has corrected self; added more fiber with resolution; taking Magnesium.  Did not need to see [facility] due to improving diarrhea.  5.  Headache: better; pressure is better; s/p MRI brain negative.  Off balance.  6. Refills: needs refills of multiple medications.  Review of Systems  Constitutional: Positive for fatigue. Negative for fever, chills and diaphoresis.  HENT: Positive for congestion, ear pain, postnasal drip, rhinorrhea and sinus pressure. Negative for dental problem, ear discharge and sore throat.   Eyes: Negative for photophobia and visual disturbance.  Respiratory: Negative for cough and shortness of breath.   Cardiovascular: Negative for chest pain, palpitations and leg swelling.  Gastrointestinal: Positive for nausea. Negative for vomiting, abdominal pain, diarrhea, constipation and blood in stool.  Skin: Negative for rash.  Neurological: Positive for headaches. Negative for dizziness, tremors, seizures, syncope, facial asymmetry, speech difficulty, weakness, light-headedness and numbness.  Psychiatric/Behavioral: Positive for sleep disturbance and dysphoric mood. Negative for suicidal ideas and self-injury. The patient is  nervous/anxious.    Past Medical History  Diagnosis Date  . Menopause   . GERD (gastroesophageal reflux disease)   . Urinary incontinence   . Migraine   . Depression   . Anxiety   . Hypertension   . Thyroid disease    Allergies  Allergen Reactions  . Amoxicillin     REACTION: Rash  . Metoclopramide Hcl     REACTION: Affected nerves  . Penicillins     REACTION: Rash  . Sulfamethoxazole     REACTION: Sores in mouth   Current Outpatient  Prescriptions on File Prior to Visit  Medication Sig Dispense Refill  . ALPRAZolam (XANAX) 0.5 MG tablet Take 1 tablet (0.5 mg total) by mouth 3 (three) times daily as needed for sleep.  90 tablet  1  . atenolol (TENORMIN) 25 MG tablet Take 1 tablet (25 mg total) by mouth 2 (two) times daily.  60 tablet  6  . Cholecalciferol (VITAMIN D3) 3000 UNITS TABS Take 5,000 Units by mouth 2 (two) times daily.       . cyanocobalamin (,VITAMIN B-12,) 1000 MCG/ML injection Inject 1 mL (1,000 mcg total) into the muscle every 14 (fourteen) days.  2 mL  12  . escitalopram (LEXAPRO) 20 MG tablet Take 1 tablet (20 mg total) by mouth daily.  30 tablet  5  . estradiol (ESTRACE) 1 MG tablet Take 1 mg by mouth daily.      Marland Kitchen ibuprofen (ADVIL,MOTRIN) 800 MG tablet Take 800 mg by mouth as needed.      . metaxalone (SKELAXIN) 800 MG tablet Take 1 tablet (800 mg total) by mouth 3 (three) times daily.  60 tablet  3  . Misc Natural Products (TART CHERRY ADVANCED PO) Take 1,200 mg by mouth as needed.      Marland Kitchen NEEDLE, DISP, 25 G (B-D DISP NEEDLE 25GX1") 25G X 1" MISC        . NON FORMULARY Take 165 mg by mouth daily. ALL ADRENAL RAW.      Marland Kitchen NON FORMULARY Take 360 mg by mouth as needed. SLIPPERY ELM      . NON FORMULARY BROMELAIN & PAPAIN as needed for pain      . NON FORMULARY ANDROGRAPHIS EXTRACT 400mg  as needed for immunity.      Marland Kitchen ondansetron (ZOFRAN) 4 MG tablet Take 4-8 mg by mouth as directed. Every 4 to 6 hours as needed for nausea      . Probiotic Product (PROBIOTIC DAILY PO) Take 1 capsule by mouth daily.      . progesterone (PROMETRIUM) 100 MG capsule Take 100 mg by mouth At bedtime.      . rizatriptan (MAXALT) 5 MG tablet Take 1 tablet (5 mg total) by mouth as needed for migraine. May repeat in 2 hours if needed  10 tablet  0  . SUMAtriptan (IMITREX) 100 MG tablet TAKE 1 TABLET BY MOUTH AT ONSET OF HEADACHE, MAY REPEAT ONCE IF NEEDED  9 tablet  5  . Thyroid 48.75 MG TABS Take 1 tablet by mouth 2 (two) times daily.        Marland Kitchen tretinoin (RETIN-A) 0.025 % cream Apply topically at bedtime.  45 g  1  . valACYclovir (VALTREX) 500 MG tablet Take 1 tablet (500 mg total) by mouth 3 (three) times daily. For viral infection  90 tablet  3  . zolpidem (AMBIEN) 10 MG tablet TAKE 1/2 TABLET AT BEDTIME AS NEEDED FOR SLEEP  30 tablet  0  . ALOE  VERA JUICE PO Take by mouth as needed.      . NON FORMULARY Take 200 mg by mouth every morning. PREGNENOLONE (for memory and hormonal balance).      . [DISCONTINUED] DULoxetine (CYMBALTA) 30 MG capsule Take 1 tablet everyday for fibromyalgia.  30 capsule  11  . [DISCONTINUED] isometheptene-acetaminophen-dichloralphenazone (MIDRIN) 65-325-100 MG per capsule Take 1 capsule by mouth every 8 (eight) hours as needed. For headache       . [DISCONTINUED] omeprazole (PRILOSEC) 40 MG capsule Take 1 capsule (40 mg total) by mouth 2 (two) times daily as needed. For severe GERD  60 capsule  11   No current facility-administered medications on file prior to visit.   History   Social History  . Marital Status: Married    Spouse Name: N/A    Number of Children: N/A  . Years of Education: N/A   Occupational History  . Not on file.   Social History Main Topics  . Smoking status: Never Smoker   . Smokeless tobacco: Never Used  . Alcohol Use: No  . Drug Use: No  . Sexual Activity: Yes    Birth Control/ Protection: None   Other Topics Concern  . Not on file   Social History Narrative   Regular exercise - NO             Objective:   Physical Exam  Nursing note and vitals reviewed. Constitutional: She is oriented to person, place, and time. She appears well-developed and well-nourished. No distress.  HENT:  Head: Normocephalic and atraumatic.  Right Ear: External ear normal.  Left Ear: External ear normal.  Nose: Right sinus exhibits maxillary sinus tenderness. Right sinus exhibits no frontal sinus tenderness. Left sinus exhibits maxillary sinus tenderness. Left sinus exhibits no  frontal sinus tenderness.  Mouth/Throat: Oropharynx is clear and moist.  Eyes: Conjunctivae and EOM are normal. Pupils are equal, round, and reactive to light.  Neck: Normal range of motion. Neck supple. No thyromegaly present.  Cardiovascular: Normal rate, regular rhythm, normal heart sounds and intact distal pulses.  Exam reveals no gallop and no friction rub.   No murmur heard. Pulmonary/Chest: Effort normal and breath sounds normal. She has no wheezes. She has no rales.  Abdominal: Soft. Bowel sounds are normal. She exhibits no distension. There is no tenderness. There is no rebound and no guarding.  Lymphadenopathy:    She has no cervical adenopathy.  Neurological: She is alert and oriented to person, place, and time. No cranial nerve deficit. She exhibits normal muscle tone. Coordination normal.  Skin: Skin is warm and dry. No rash noted. She is not diaphoretic.  Psychiatric: Her behavior is normal. Judgment and thought content normal. Her mood appears anxious. Her speech is tangential. Cognition and memory are normal.       Assessment & Plan:  Acute upper respiratory infections of unspecified site  ANXIETY DEPRESSION  MIGRAINE HEADACHE  Diarrhea   1.  URI: New. Recommend supportive care with Ibuprofen or Tylenol; if no improvement in 5-7 days, to fill Zpack. 2.  Anxiety with depression: uncontrolled; major family stressors; I have highly encouraged psychiatry consultation and intensive outpatient psychotherapy; patient reluctant to undergo either at this time; counseling provided in office; no change in medication at this time.   3.  Migraine headache; worsening due to worsening stressors; pt declined [facility] to [facility]; recommend considering second opinion by [facility].  Pt to consider; s/p recent MRI brain.  Really need to avoid  over-medication for headaches. 4.  Diarrhea:   Improved/resolved.  Stool studies negative. 5. Polypharmacy: need to work on  multiple medications for same disease state; need to simplify medication list in future.  Meds ordered this encounter  Medications  . DISCONTD: azithromycin (ZITHROMAX) 250 MG tablet    Sig: Take 2 tabs PO x 1 dose, then 1 tab PO QD x 4 days    Dispense:  6 tablet    Refill:  0

## 2012-09-29 ENCOUNTER — Telehealth: Payer: Self-pay

## 2012-09-29 NOTE — Telephone Encounter (Signed)
She states she is nauseated. Advised her to take with food and to drink lots of water. If this worsens she will call me back.

## 2012-09-29 NOTE — Telephone Encounter (Signed)
She is on Z pack, her line is busy.

## 2012-09-29 NOTE — Telephone Encounter (Signed)
Patient says that her antibiotic is possible making her sick please call patient at [phone]

## 2012-09-30 ENCOUNTER — Other Ambulatory Visit: Payer: Self-pay | Admitting: Family Medicine

## 2012-10-10 ENCOUNTER — Other Ambulatory Visit: Payer: Self-pay | Admitting: Family Medicine

## 2012-10-11 ENCOUNTER — Other Ambulatory Visit: Payer: Self-pay | Admitting: Internal Medicine

## 2012-10-11 ENCOUNTER — Other Ambulatory Visit: Payer: Self-pay | Admitting: Family Medicine

## 2012-10-11 ENCOUNTER — Telehealth: Payer: Self-pay

## 2012-10-11 NOTE — Telephone Encounter (Signed)
PT STATES SHE HAD SEEN DR Katrinka Blazing AND HER MEDS WERE TO BE CALLED IN, SPOKE WITH THE NURSE AND WAS TOLD IT WAS ALREADY DONE, BUT THEY HAVEN'T RECEIVED ANYTHING PLEASE CALL PT AT [phone]   [facility] [address]

## 2012-10-11 NOTE — Telephone Encounter (Signed)
1. What medication is she requesting? She states she needs Fioricet, Librax, Protonix, Klonopin, and Tramadol, she was told when she was here for her office visit these had been sent to the pharmacy.  2. Patient also wants note for the nurse at her work that is it okay for her to get her B12 injection every 10 days. She wants note mailed to her.  3. She also needs to know the number for Dr Nolen Mu, I do not know where this person is located, please advise.

## 2012-10-14 MED ORDER — PANTOPRAZOLE SODIUM 40 MG PO TBEC: DELAYED_RELEASE_TABLET | ORAL | Status: DC

## 2012-10-14 MED ORDER — BUTALBITAL-APAP-CAFFEINE 50-325-40 MG PO TABS: 1.0000 | ORAL_TABLET | Freq: Four times a day (QID) | ORAL | Status: DC | PRN

## 2012-10-14 MED ORDER — CLONAZEPAM 0.5 MG PO TABS: 0.5000 mg | ORAL_TABLET | Freq: Two times a day (BID) | ORAL | Status: DC | PRN

## 2012-10-14 MED ORDER — CILIDINIUM-CHLORDIAZEPOXIDE 2.5-5 MG PO CAPS: 1.0000 | ORAL_CAPSULE | Freq: Three times a day (TID) | ORAL | Status: DC

## 2012-10-14 MED ORDER — TRAMADOL HCL 50 MG PO TABS: 50.0000 mg | ORAL_TABLET | Freq: Three times a day (TID) | ORAL | Status: DC | PRN

## 2012-10-14 NOTE — Telephone Encounter (Signed)
Please call in refill of Clonazepam and Tramadol.  I escribed Protonix, Fioricet, Librax to pharmacy.

## 2012-10-17 ENCOUNTER — Other Ambulatory Visit: Payer: Self-pay | Admitting: Family Medicine

## 2012-10-19 ENCOUNTER — Other Ambulatory Visit: Payer: Self-pay | Admitting: Family Medicine

## 2012-10-19 NOTE — Telephone Encounter (Signed)
Called in, called patient, I don't know why I didn't get this message. Apologized for the error.

## 2012-10-19 NOTE — Telephone Encounter (Signed)
[facility] [address] [facility] not yet received a refill request on Clonazepam. States that they faxed over a refill request on last night and earlier today.  [phone]

## 2012-10-28 ENCOUNTER — Ambulatory Visit (INDEPENDENT_AMBULATORY_CARE_PROVIDER_SITE_OTHER): Payer: Federal, State, Local not specified - PPO | Admitting: Internal Medicine

## 2012-10-28 ENCOUNTER — Ambulatory Visit: Payer: Federal, State, Local not specified - PPO

## 2012-10-28 VITALS — BP 110/70 | HR 81 | Temp 99.0°F | Resp 16 | Ht 66.0 in | Wt 146.0 lb

## 2012-10-28 DIAGNOSIS — M5137 Other intervertebral disc degeneration, lumbosacral region: Secondary | ICD-10-CM

## 2012-10-28 DIAGNOSIS — M4120 Other idiopathic scoliosis, site unspecified: Secondary | ICD-10-CM

## 2012-10-28 DIAGNOSIS — M545 Low back pain: Secondary | ICD-10-CM

## 2012-10-28 DIAGNOSIS — Z23 Encounter for immunization: Secondary | ICD-10-CM

## 2012-10-28 DIAGNOSIS — M549 Dorsalgia, unspecified: Secondary | ICD-10-CM

## 2012-10-28 DIAGNOSIS — M5136 Other intervertebral disc degeneration, lumbar region: Secondary | ICD-10-CM

## 2012-10-28 DIAGNOSIS — J301 Allergic rhinitis due to pollen: Secondary | ICD-10-CM

## 2012-10-28 MED ORDER — PREDNISONE 20 MG PO TABS: ORAL_TABLET | ORAL | Status: DC

## 2012-10-28 MED ORDER — CETIRIZINE HCL 10 MG PO TABS: 10.0000 mg | ORAL_TABLET | Freq: Every day | ORAL | Status: DC | PRN

## 2012-10-28 MED ORDER — HYDROCODONE-ACETAMINOPHEN 5-325 MG PO TABS: 1.0000 | ORAL_TABLET | Freq: Four times a day (QID) | ORAL | Status: DC | PRN

## 2012-10-28 MED ORDER — CYCLOBENZAPRINE HCL 10 MG PO TABS: 10.0000 mg | ORAL_TABLET | Freq: Three times a day (TID) | ORAL | Status: DC | PRN

## 2012-10-28 NOTE — Progress Notes (Addendum)
Subjective:  This chart was scribed for Mandy Sia, MD by Karle Plumber, ED Scribe. This patient was seen in room 9 and the patient's care was started at 2:14 PM.   Patient ID: Mandy Diaz, female    DOB: 08-21-1950, 62 y.o.   MRN: [id number]  HPI HPI Comments:  Mandy Diaz is a 62 y.o. female with h/o osteopenia who presents to the [facility] complaining of constant lower back pain radiating down lower extremities to thighs bilaterally onset yesterday. Pt reports the pain worsens with movement and experienced pain on the ride here. Pt states when she stood up yesterdsay she heard multiple cracks and pops. Pt states she took Fioricet, Skelaxin, and sleep aid and still could not sleep last night. Pt also reports wearing Lidoderm patches with no relief. She reports using ice and heat compresses. She also states she used the TENS. Pt reports working and lifts things regularly. Pt reports temperature of 99 degrees. She denies any urinary symptoms. She has had intermittent back pain over the last several years and has a known problem with degenerative scoliosis.  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 12/28/2006   ANXIETY DEPRESSION 12/28/2006   GERD 09/09/2006  Current outpatient prescriptions:ALOE VERA JUICE PO, Take by mouth as needed., Disp: , Rfl: ;   ALPRAZolam (XANAX) 0.5 MG tablet, Take 1 tablet (0.5 mg total) by mouth 3 (three) times daily as  needed for sleep., Disp: 90 tablet, Rfl: 1;   atenolol (TENORMIN) 25 MG tablet, Take 1 tablet (25 mg total) by mouth 2 (two) times daily., Disp: 60 tablet, Rfl: 6 buPROPion (WELLBUTRIN XL) 300 MG 24 hr tablet, TAKE 1 TABLET BY MOUTH EVERY DAY FOR DEPRESSION, Disp: 30 tablet, Rfl: 5;   butalbital-acetaminophen-caffeine (FIORICET, ESGIC) 50-325-40 MG per tablet, Take 1 tablet by mouth every 6 (six) hours as needed for headache., Disp: 45 tablet, Rfl: 2;  cetirizine (ZYRTEC) 10 MG tablet, Take 1 tablet (10 mg total) by mouth daily as needed., Disp: 30 tablet, Rfl: 5 Cholecalciferol (VITAMIN D3) 3000 UNITS TABS, Take 5,000 Units by mouth 2 (two) times daily. , Disp: , Rfl: ;   citalopram (CELEXA) 40 MG tablet, TAKE 1 & 1/2 TABLET BY MOUTH EVERY DAY, Disp: 45 tablet, Rfl: 5;   clidinium-chlordiazePOXIDE (LIBRAX) 2.5-5 MG per capsule, Take 1 capsule by mouth 4 (four) times daily -  before meals and at bedtime., Disp: 120 capsule, Rfl: 6 clonazePAM (KLONOPIN) 0.5 MG tablet, Take 1 tablet (0.5 mg total) by mouth 2 (two) times daily as needed for anxiety., Disp: 40 tablet, Rfl: 1;  cyanocobalamin (,VITAMIN B-12,) 1000 MCG/ML injection, Inject 1 mL (1,000 mcg total) into the muscle every 14 (fourteen) days., Disp: 2 mL, Rfl: 12  estradiol (ESTRACE) 1 MG tablet, Take 1 mg by mouth daily., Disp: , Rfl: ;   ibuprofen (ADVIL,MOTRIN) 800 MG tablet, Take 800 mg by mouth as needed., Disp: , Rfl:  metaxalone (SKELAXIN) 800 MG tablet, Take 1 tablet (800 mg  total) by mouth 3 (three) times daily., Disp: 60 tablet, Rfl: 3;  Misc Natural Products (TART CHERRY ADVANCED PO), Take 1,200 mg by mouth as needed.,  PREGNENOLONE (for memory and hormonal balance)., Disp: , Rfl:  NON FORMULARY, Take 165 mg by mouth daily. ALL ADRENAL RAW., Disp: , Rfl: ;  NON FORMULARY, Take 360 mg by mouth as needed.  SLIPPERY ELM, Disp: , Rfl: ;  NON FORMULARY, BROMELAIN & PAPAIN as needed for pain, Disp: , Rfl: ;  NON FORMULARY, ANDROGRAPHIS  EXTRACT 400mg  as needed for immunity., Disp: , Rfl: ;   ondansetron (ZOFRAN) 4 MG tablet, Take 4-8 mg by mouth as directed. Every 4 to 6 hours as needed for nausea, Disp: , Rfl:  Probiotic Product (PROBIOTIC DAILY PO), Take 1 capsule by mouth daily., Disp: , Rfl: ;  progesterone (PROMETRIUM) 100 MG capsule, Take 100 mg by mouth At bedtime., Disp: , Rfl: ;  rizatriptan (MAXALT) 5 MG tablet, Take 1 tablet (5 mg total) by mouth as needed for migraine. May repeat in 2 hours if needed, Disp: 10 tablet, Rfl: 0;  Thyroid 48.75 MG TABS, Take 1 tablet by mouth 2 (two) times daily. , Disp: , Rfl:  traMADol (ULTRAM) 50 MG tablet, Take 1 tablet (50 mg total) by mouth every 8 (eight) hours as needed for pain., Disp: 45 tablet, Rfl: 2;   tretinoin (RETIN-A) 0.025 % cream, Apply topically at bedtime., Disp: 45 g, Rfl: 1;   valACYclovir (VALTREX) 500 MG tablet, Take 1 tablet (500 mg total) by mouth 3 (three) times daily. For viral infection, Disp: 90 tablet, Rfl: 3 zolpidem (AMBIEN) 10 MG tablet, TAKE 1/2 TABLET AT BEDTIME AS NEEDED FOR SLEEP, Disp: 30 tablet, Rfl: 0;  escitalopram (LEXAPRO) 20 MG tablet, Take 1 tablet (20 mg total) by mouth daily., Disp: 30 tablet, Rfl: 5;  HYDROcodone-acetaminophen  pantoprazole (PROTONIX) 40 MG tablet, TAKE 1 TABLET BY MOUTH DAILY, Disp: 30 tablet, Rfl: 11;   SUMAtriptan (IMITREX) 100 MG tablet, TAKE 1 TABLET BY MOUTH AT ONSET OF HEADACHE, MAY REPEAT ONCE IF NEEDED DULoxetine (CYMBALTA) 30 MG capsule, Take 1 tablet everyday for fibromyalgia., Disp: 30 capsule, Rfl: 11 omeprazole (PRILOSEC) 40 MG capsule, Take 1 capsule (40 mg total) by mouth 2 (two) times daily as needed. For severe GERD, Disp: 60 capsule, Rfl: 11   Review of Systems  Constitutional: Positive for activity change. Negative for fever and unexpected weight change.  Eyes: Negative for visual disturbance.  Respiratory: Negative for cough and shortness of breath.   Cardiovascular: Negative for chest pain, palpitations  and leg swelling.  Gastrointestinal: Negative for abdominal pain.  Genitourinary: Negative for difficulty urinating.  Musculoskeletal: Positive for back pain, gait problem and myalgias. Negative for joint swelling, neck pain and neck stiffness.  Neurological: Negative for headaches.  Hematological: Negative for adenopathy.  Psychiatric/Behavioral: Positive for sleep disturbance.       Objective:   Physical Exam  Constitutional: She is oriented to person, place, and time. She appears well-developed and well-nourished. She appears distressed.  Lying on table for comfort  Eyes: Conjunctivae and EOM are normal. Pupils are equal, round, and reactive to light.  Neck: Neck supple.  Cardiovascular: Normal rate.   Pulmonary/Chest: Effort normal.  Musculoskeletal: She exhibits no edema.  Tender over the paraSpinous muscles and the entire lumbar area both right and left Straight leg leg is positive on the left at 90 Deep tendon reflexes symmetrical No peripheral sensory or motor losses Gait seems unaffected Flexion and rotation  are limited by pain  Neurological: She is alert and oriented to person, place, and time. She has normal reflexes. No cranial nerve deficit.  Skin: No rash noted.    BP 110/70   Pulse 81   Temp(Src) 99 F (37.2 C) (Oral)   Resp 16   Ht 5\' 6"  (1.676 m)   Wt 146 lb (66.225 kg)   BMI 23.58 kg/m2   SpO2 99% [facility] reading (PRIMARY) by  Dr. Merla Riches marked scoliosius with loss of disc space at l3///no compr fx      Assessment & Plan:   Meds ordered this encounter  Medications   HYDROcodone-acetaminophen (NORCO/VICODIN) 5-325 MG per tablet    Sig: Take 1 tablet by mouth every 6 (six) hours as needed for pain.    Dispense:  30 tablet    Refill:  0   predniSONE (DELTASONE) 20 MG tablet    Sig: 3/3/2/2/1/1 single daily dose for 6 days    Dispense:  12 tablet    Refill:  0   cetirizine (ZYRTEC) 10 MG tablet    Sig: Take 1 tablet (10 mg total) by mouth daily as  needed.    Dispense:  30 tablet    Refill:  5   cyclobenzaprine (FLEXERIL) 10 MG tablet    Sig: Take 1 tablet (10 mg total) by mouth 3 (three) times daily as needed for muscle spasms. 1 morning, mid-afternoon and bedtime for muscle spasm    Dispense:  30 tablet    Refill:  0    The primary encounter diagnosis was Lumbar pain with radiation down both legs. Diagnoses of Idiopathic scoliosis, Degenerative disc disease, lumbar, and Allergic rhinitis due to pollen were also pertinent to this visit.  Will obtain an X-Ray of the back to r/o compression fracture.  Advised pt to continue with ice compresses, stretching, and adding prednisone for six days.  Will prescribe Norco for pain.  Referred to orthopedic back specialist . Pt verbalizes understanding and agrees to plan.

## 2012-11-01 ENCOUNTER — Telehealth: Payer: Self-pay

## 2012-11-01 NOTE — Telephone Encounter (Signed)
Pt was seen by dr Merla Riches and referred to ortho and referrals sent her to [facility] but she states that dr Merla Riches gave her a specific name and she would like to see him and can not remember it.  She would like to try to have this scheduled this week since she is out all week   Best number [phone]

## 2012-11-01 NOTE — Telephone Encounter (Signed)
Your note indicates ortho spine specialist, referral sent to [facility], did you prefer someone else?

## 2012-11-01 NOTE — Telephone Encounter (Signed)
Pt is referred to [facility] to Dr Marissa Nestle. [facility] requested pts xrays copied to cd for pickup prior to appt. pts appt is Friday. Please have ready by Thursday for pickup.  Please call pt when ready.  Thanks bf

## 2012-11-01 NOTE — Telephone Encounter (Signed)
i really wasn't being particular and could have done any of the folks that like backs--Dr Otelia Sergeant included If you want to help make this easier she might be more satisfied I'll be in [place] til Monday night

## 2012-11-02 NOTE — Telephone Encounter (Signed)
Thank you. She has appt with Dr Yevette Edwards on Friday, advised her this is spine specialist at [facility].

## 2012-11-02 NOTE — Telephone Encounter (Signed)
Request given to xray 

## 2012-11-18 ENCOUNTER — Other Ambulatory Visit: Payer: Self-pay | Admitting: Family Medicine

## 2012-11-18 ENCOUNTER — Other Ambulatory Visit: Payer: Self-pay | Admitting: Internal Medicine

## 2012-11-21 ENCOUNTER — Other Ambulatory Visit: Payer: Self-pay | Admitting: Family Medicine

## 2012-11-21 NOTE — Telephone Encounter (Signed)
Pt has been told by the pharmacy that the refill request was denied because she is should be getting this from neuro and neuro states they have never given it to her so she needs to get this from [place]   Please call [phone]

## 2012-11-23 ENCOUNTER — Telehealth: Payer: Self-pay

## 2012-11-23 DIAGNOSIS — M549 Dorsalgia, unspecified: Secondary | ICD-10-CM

## 2012-11-23 NOTE — Telephone Encounter (Signed)
Dr Orinda Kenner or Lovell Sheehan Also Dr Yetta Barre

## 2012-11-23 NOTE — Telephone Encounter (Signed)
Pt husband is calling because they are needing activation number for mychart I gave them the number for my chart and they were told to call back to our office call back number is [phone]

## 2012-11-23 NOTE — Telephone Encounter (Signed)
You referred to Dr Ronelle Nigh, now she would like to know if you will recommend a neurosurgeon, please advise.

## 2012-11-23 NOTE — Telephone Encounter (Signed)
PT WOULD LIKE TO SEE A NEURO AND WANTED TO KNOW WHO DR DOOLITTLE WOULD SUGGEST. PLEASE CALL [phone]

## 2012-11-23 NOTE — Telephone Encounter (Signed)
Please advise pended 

## 2012-11-23 NOTE — Telephone Encounter (Signed)
PT STATES SHE REALLY NEED A REFILL ON THE FLEXERIL GIVEN BY DR Merla Riches, WON'T BE ABLE TO HAVE PT FOR ANOTHER WEEK AND IS IN PAIN. PLEASE CALL [phone]   [facility] [address]

## 2012-11-24 ENCOUNTER — Other Ambulatory Visit: Payer: Self-pay | Admitting: Radiology

## 2012-11-24 MED ORDER — CYCLOBENZAPRINE HCL 10 MG PO TABS: 10.0000 mg | ORAL_TABLET | Freq: Three times a day (TID) | ORAL | Status: DC | PRN

## 2012-11-24 NOTE — Telephone Encounter (Signed)
Patient advised this has been sent.

## 2012-11-24 NOTE — Telephone Encounter (Signed)
She called again today regarding the flexeril and did advise the orthopedist has seen her once and wants her to see someone for eval for fibromyalgia, she will not be following up with him again.

## 2012-11-24 NOTE — Telephone Encounter (Signed)
Sent!

## 2012-11-24 NOTE — Telephone Encounter (Signed)
Thanks. Spoke to her and she states she needs a Insurance account manager, not Neurosurgeon, this is to rule out Fibromyalgia. She wants referral sent, I have done this. She states she has discussed this with you as well. I have advised her I will send the message to Dr Katrinka Blazing. She wants to know who you recommend for evaluation of possible Fibromyalgia, I did tell her Dr Link Snuffer who is a rheumatologist may be helpful. Please advise.

## 2012-11-28 NOTE — Telephone Encounter (Signed)
Yes, recommend referral to rheumatology to evaluate for fibromyalgia.  Would she like this referral to Dr. Link Snuffer?

## 2012-11-28 NOTE — Telephone Encounter (Signed)
Yes, I would think this would have to go to Dr Corliss Skains, since I think she is the only rheumatologist who will evaluate for fibromyalgia. Have advised patient to expect call from their office. My chart message sent so she will have the spelling

## 2012-12-01 ENCOUNTER — Telehealth: Payer: Self-pay

## 2012-12-01 NOTE — Telephone Encounter (Signed)
Patient calling to get the name of the rheumatologist from Amy. I mentioned some of the doctors in the previous message. None sounded familiar. She would also like to as Amy some questions about mychart and said it was a doctors report- her mri. Please advise.  Best:  [phone]

## 2012-12-01 NOTE — Telephone Encounter (Signed)
Dr Corliss Skains. Called her, and she states she was advised by the office of Dr Corliss Skains she will review the records then call her back. Patient advised to let us know if she does not hear from them by the first part of next week.

## 2012-12-07 ENCOUNTER — Ambulatory Visit (INDEPENDENT_AMBULATORY_CARE_PROVIDER_SITE_OTHER): Payer: Federal, State, Local not specified - PPO | Admitting: Family Medicine

## 2012-12-07 ENCOUNTER — Encounter: Payer: Self-pay | Admitting: Family Medicine

## 2012-12-07 VITALS — BP 138/86 | HR 84 | Temp 99.2°F | Resp 16 | Ht 63.0 in | Wt 152.2 lb

## 2012-12-07 DIAGNOSIS — M25559 Pain in unspecified hip: Secondary | ICD-10-CM

## 2012-12-07 DIAGNOSIS — M791 Myalgia, unspecified site: Secondary | ICD-10-CM

## 2012-12-07 DIAGNOSIS — IMO0001 Reserved for inherently not codable concepts without codable children: Secondary | ICD-10-CM

## 2012-12-07 DIAGNOSIS — R2681 Unsteadiness on feet: Secondary | ICD-10-CM

## 2012-12-07 DIAGNOSIS — R269 Unspecified abnormalities of gait and mobility: Secondary | ICD-10-CM

## 2012-12-07 LAB — SEDIMENTATION RATE: Sed Rate: 11 mm/hr (ref 0–22)

## 2012-12-07 LAB — COMPREHENSIVE METABOLIC PANEL
ALT: 14 U/L (ref 0–35)
Alkaline Phosphatase: 52 U/L (ref 39–117)
CO2: 26 mEq/L (ref 19–32)
Creat: 0.86 mg/dL (ref 0.50–1.10)
Glucose, Bld: 92 mg/dL (ref 70–99)
Total Bilirubin: 0.3 mg/dL (ref 0.3–1.2)

## 2012-12-07 LAB — CBC WITH DIFFERENTIAL/PLATELET
Basophils Relative: 0 % (ref 0–1)
Eosinophils Absolute: 0 10*3/uL (ref 0.0–0.7)
Eosinophils Relative: 0 % (ref 0–5)
Lymphs Abs: 1.9 10*3/uL (ref 0.7–4.0)
MCH: 27.6 pg (ref 26.0–34.0)
MCHC: 33.3 g/dL (ref 30.0–36.0)
MCV: 82.9 fL (ref 78.0–100.0)
Platelets: 310 10*3/uL (ref 150–400)
RBC: 4.45 MIL/uL (ref 3.87–5.11)

## 2012-12-07 LAB — CK: Total CK: 92 U/L (ref 7–177)

## 2012-12-07 MED ORDER — PROMETHAZINE HCL 12.5 MG PO TABS: 12.5000 mg | ORAL_TABLET | Freq: Three times a day (TID) | ORAL | Status: DC | PRN

## 2012-12-07 NOTE — Progress Notes (Signed)
[address]   [place], [place]  [address]   [phone]  Subjective:    Patient ID: Mandy Diaz, female    DOB: February 01, 1950, 62 y.o.   MRN: [id number]  HPI This 62 y.o. female presents for two month follow-up:  1.  Low back pain with radiculopathy:  Referred to ortho/[facility]; s/p MRI lumbar spine; L4 abnormality/stenosis.  Recommended PT.  Had been out of work due to back pain.  Had run out of narcotic that was prescribed by Merla Riches; [facility] only prescribes Tramadol.  Lower back pain is better; had two visits with [facility].  Started PT last week; full this week; goes twice next week.  Applied TENS unit during PT.  Lower back is 75% improved.  Not taking muscle relaxers or narcotics since today.  Trying to do the best she can; was taking medication sporadically.  2. Unsteady gait:  PT was worried about gait. Chronic intermittent issue for patient.    3.  Intermittent arthralgias and myalgias: onset nine months ago with recent worsening; started in thighs B; noticed after working two consecutive days of standing at work; sthen would work and then have day off.  Using heating pads.   B knees are tender; pain radiates into lower shin.  B thighs tender; lateral hips tender.  Arms involved.  Heat and Bengay help.  Has not returned to work; has been out since 10/28/12.   Does not think she can handle bending, twisting, stooping.   Forearms and arms involved.  Not normal.    4.  Depression: has improved with decrease in Citalopram to 40mg  one daily.  Very reluctant to decrease Wellbutrin dose.  Requesting serotonin level.    Review of Systems  Constitutional: Negative for fever, chills, diaphoresis and fatigue.  Musculoskeletal: Positive for arthralgias, back pain and myalgias. Negative for joint swelling.  Neurological: Positive for headaches. Negative for dizziness, tremors, seizures, syncope, facial asymmetry, speech difficulty, weakness, light-headedness and numbness.  Psychiatric/Behavioral:  Positive for sleep disturbance and decreased concentration. Negative for suicidal ideas and self-injury. The patient is nervous/anxious.    Past Medical History  Diagnosis Date  . Menopause   . GERD (gastroesophageal reflux disease)   . Urinary incontinence   . Migraine   . Depression   . Anxiety   . Hypertension   . Thyroid disease   . Fibromyalgia 12/2012    s/p rheumatology consultation to confirm diagnosis; no evidence of autoimmune process.   Allergies  Allergen Reactions  . Amoxicillin     REACTION: Rash  . Metoclopramide Hcl     REACTION: Affected nerves  . Penicillins     REACTION: Rash  . Sulfamethoxazole     REACTION: Sores in mouth   History   Social History  . Marital Status: Married    Spouse Name: N/A    Number of Children: N/A  . Years of Education: N/A   Occupational History  . Not on file.   Social History Main Topics  . Smoking status: Never Smoker   . Smokeless tobacco: Never Used  . Alcohol Use: No  . Drug Use: No  . Sexual Activity: Yes    Birth Control/ Protection: None   Other Topics Concern  . Not on file   Social History Narrative   Regular exercise - NO         Family History  Problem Relation Age of Onset  . Arthritis    . Diabetes      1st degree  relative  . Hyperlipidemia    . Stroke      Female 1st degree relative <50  . Heart disease    . Cancer Mother   . Cancer Father   . Heart disease Father   . Diabetes Brother   . Heart disease Brother   . Heart disease Daughter        Objective:   Physical Exam  Constitutional: She is oriented to person, place, and time. She appears well-developed and well-nourished. No distress.  HENT:  Head: Normocephalic and atraumatic.  Right Ear: External ear normal.  Left Ear: External ear normal.  Nose: Nose normal.  Mouth/Throat: Oropharynx is clear and moist.  Eyes: Conjunctivae and EOM are normal. Pupils are equal, round, and reactive to light.  Neck: Normal range of motion.  Neck supple. Carotid bruit is not present. No thyromegaly present.  Cardiovascular: Normal rate, regular rhythm, normal heart sounds and intact distal pulses.  Exam reveals no gallop and no friction rub.   No murmur heard. Pulmonary/Chest: Effort normal and breath sounds normal. She has no wheezes. She has no rales.  Abdominal: Soft. Bowel sounds are normal. She exhibits no distension and no mass. There is no tenderness. There is no rebound and no guarding.  Musculoskeletal:       Cervical back: Normal.       Thoracic back: Normal.       Lumbar back: She exhibits normal range of motion.  Lymphadenopathy:    She has no cervical adenopathy.  Neurological: She is alert and oriented to person, place, and time. She has normal reflexes. No cranial nerve deficit. She exhibits normal muscle tone. Coordination normal.  Skin: Skin is warm and dry. No rash noted. She is not diaphoretic. No erythema. No pallor.  Psychiatric: She has a normal mood and affect. Her behavior is normal.      Assessment & Plan:  Chronic arthralgias of knees and hips, unspecified laterality - Plan: Ambulatory referral to Rheumatology, CBC with Differential, Comprehensive metabolic panel, Sedimentation Rate, CK, CANCELED: Serotonin serum  Myalgia - Plan: TSH, CANCELED: Serotonin serum  Unsteady gait - Plan: TSH, CANCELED: Serotonin serum  1. Chronic arthralgias:  Worsening; refer to rheumatology to rule out autoimmune process.  Obtain labs. 2.  Myalgias:  New.  Obtain autoimmune labs; also obtain serotonin level per patient request. 3.  Unsteady gait:  New.  Obtain labs If persist, refer to neurology for further evaluation.  Concern for serotonin syndrome was expressed during visit; also polypharmacy also discussed regarding potential side effects of various medications.   4.  Polypharmacy: persistent; recommend limiting medications used due to extent of various PRN medications available. 5. Depression with anxiety: improved;  continue Celexa 40mg  daily; no change to Wellbutrin at this time but with upcoming goal is to decrease Wellbutrin dose. 6. Migraines: stable; refill of Phenergan provided to use PRIN.  Meds ordered this encounter  Medications  . promethazine (PHENERGAN) 12.5 MG tablet    Sig: Take 1 tablet (12.5 mg total) by mouth every 8 (eight) hours as needed for nausea or vomiting.    Dispense:  30 tablet    Refill:  0   Nilda Simmer, M.D.  [facility] [address] [place], [place]  [address] [phone] phone [phone] fax

## 2012-12-09 ENCOUNTER — Telehealth: Payer: Self-pay

## 2012-12-09 ENCOUNTER — Other Ambulatory Visit (INDEPENDENT_AMBULATORY_CARE_PROVIDER_SITE_OTHER): Payer: Federal, State, Local not specified - PPO | Admitting: Family Medicine

## 2012-12-09 DIAGNOSIS — M791 Myalgia, unspecified site: Secondary | ICD-10-CM

## 2012-12-09 DIAGNOSIS — R2681 Unsteadiness on feet: Secondary | ICD-10-CM

## 2012-12-09 DIAGNOSIS — M25559 Pain in unspecified hip: Secondary | ICD-10-CM

## 2012-12-09 DIAGNOSIS — R269 Unspecified abnormalities of gait and mobility: Secondary | ICD-10-CM

## 2012-12-09 DIAGNOSIS — IMO0001 Reserved for inherently not codable concepts without codable children: Secondary | ICD-10-CM

## 2012-12-09 NOTE — Telephone Encounter (Signed)
Dr. Katrinka Blazing, [facility] called to say that the Serotonin level could not be run because it is a frozen and that they didn't receive a frozen. I know one was sent frozen and we are looking into it to see what happened, but in the meantime, Angie P called pt to RTC for blood draw only. Future order placed.

## 2012-12-09 NOTE — Telephone Encounter (Signed)
Dr. Katrinka Blazing, we had pt RTC for a re draw, but apparently both times we sent the blood in the tube that we draw it in, and [facility] is unable to run it in that tube after it has been frozen. So this is the second time that we have made a mistake on this patient. I am so so so sorry. And I'm going to talk to [facility] and make sure that no one else does this again. Do you want [place] to call the patient back again? Again, I am so sorry. Claudia Desanctis

## 2012-12-12 ENCOUNTER — Telehealth: Payer: Self-pay

## 2012-12-12 NOTE — Telephone Encounter (Signed)
please advise.

## 2012-12-12 NOTE — Telephone Encounter (Signed)
Pt has questions from dr Katrinka Blazing about seeing dr deveshwar referrals sent a note to dr Katrinka Blazing that deveshwar is not taking new fibromyalgia patients and dr Katrinka Blazing sent a new referral which pt now has an appt with dr Anson [place] on 12/27/12 .  Pt explained that she understood that dr Katrinka Blazing was going to talk with dr deveshwar and see if she would reconsider seeing her .  Pt wants to know if that happened, or does she need to keep the be[facility]man appt  Best numebr [phone]

## 2012-12-13 NOTE — Telephone Encounter (Signed)
Dr. Katrinka Blazing could you review and advise on lab results so I can give to patient when I call about the serotonin

## 2012-12-13 NOTE — Telephone Encounter (Signed)
I have advised her.  

## 2012-12-13 NOTE — Telephone Encounter (Signed)
Please return call --- I would recommend patient keeping appointment with Dr. Anson [place] on 12/27/12.

## 2012-12-14 LAB — SEROTONIN SERUM: Serotonin, Serum: 14 ng/mL — ABNORMAL LOW (ref 56–244)

## 2012-12-17 ENCOUNTER — Encounter: Payer: Self-pay | Admitting: Family Medicine

## 2012-12-17 NOTE — Telephone Encounter (Signed)
Lab results sent to patient via MyChart.  Patient should be able to see labs in MyChart.

## 2012-12-19 DIAGNOSIS — M797 Fibromyalgia: Secondary | ICD-10-CM

## 2012-12-19 HISTORY — DX: Fibromyalgia: M79.7

## 2013-01-10 ENCOUNTER — Other Ambulatory Visit: Payer: Self-pay | Admitting: Family Medicine

## 2013-01-10 NOTE — Telephone Encounter (Signed)
Dr Katrinka Blazing, it looks like Dr Rodena Medin was the last to RX this, and I don't see a B12 lab done by [place]. Do you want to RX for pt?

## 2013-01-16 ENCOUNTER — Ambulatory Visit: Payer: Federal, State, Local not specified - PPO | Admitting: Family Medicine

## 2013-01-26 ENCOUNTER — Other Ambulatory Visit: Payer: Self-pay | Admitting: Family Medicine

## 2013-01-26 ENCOUNTER — Telehealth: Payer: Self-pay

## 2013-01-26 MED ORDER — CLONAZEPAM 0.5 MG PO TABS: 0.5000 mg | ORAL_TABLET | Freq: Two times a day (BID) | ORAL | Status: DC | PRN

## 2013-01-26 NOTE — Telephone Encounter (Signed)
Pt notified that rx was sent in 

## 2013-01-26 NOTE — Telephone Encounter (Signed)
Pended please advise.  

## 2013-01-26 NOTE — Telephone Encounter (Signed)
Patient needs a refill on Klonopin. She is completely out  [phone]

## 2013-01-26 NOTE — Telephone Encounter (Signed)
Please fax in or call in refill of Klonopin.

## 2013-03-01 ENCOUNTER — Other Ambulatory Visit: Payer: Self-pay | Admitting: Family Medicine

## 2013-03-02 ENCOUNTER — Other Ambulatory Visit: Payer: Self-pay

## 2013-03-02 DIAGNOSIS — G4700 Insomnia, unspecified: Secondary | ICD-10-CM

## 2013-03-02 MED ORDER — ZOLPIDEM TARTRATE 5 MG PO TABS: 5.0000 mg | ORAL_TABLET | Freq: Every evening | ORAL | Status: DC | PRN

## 2013-03-02 NOTE — Telephone Encounter (Signed)
Also another RF enc for the zolpidem.

## 2013-03-02 NOTE — Telephone Encounter (Signed)
Patient called back and is also not only requesting refill for midrin but also for generic ambien please advise Dr. Katrinka BlazingSmith. She is completley out.

## 2013-03-02 NOTE — Telephone Encounter (Signed)
[facility] called to req a new Rx for Midrin for pt. They have not filled for pt before, and I do not see in EPIC under pt's med list. Dr Katrinka BlazingSmith, do you want to Rx or pt need to RTC for eval? Pended but don't know what you would want for sig/#.

## 2013-03-02 NOTE — Telephone Encounter (Signed)
I have patient on Fioricet and Maxalt for headaches/migraines; I am not comfortable with her also taking Midrin; this is way too many headache medications.  OK to call in refill of Ambien as approved.  Please advise patient that per current guidelines, females should not be prescribed more than 5mg  of Ambien; thus, I have only written for Ambien 5mg  qhs. Also please advise patient that she is due for follow-up with me.  I will have scheduling contact her for an appointment.

## 2013-03-03 NOTE — Telephone Encounter (Signed)
D/W pt all of Dr Michaelle CopasSmith's message. She stated that she had discussed w/Dr Katrinka BlazingSmith at last OV how she uses the HA meds at different times depending upon the type/level of HA she has, and usually starts w/t the Midrin if it is a lower level type of HA at onset to try to stop it before she needs the other meds. She hasn't needed a Rx for it from Dr Katrinka BlazingSmith since she started seeing her. Pt had an appt on Dec 29, but we had to call her and cancel it. She would like scheduling to call her to set up another appt. Pt stated understanding of reduction of zolpidem, she had always cut the 10 mg in half and only took the 5 mg and Rx lasted longer that way. Dr Katrinka BlazingSmith, [facility]  Scheduling, pls call pt for appt.

## 2013-03-10 ENCOUNTER — Telehealth: Payer: Self-pay

## 2013-03-10 NOTE — Telephone Encounter (Signed)
PA needed for pantoprazole 40. Completed on covermymeds.

## 2013-04-05 ENCOUNTER — Telehealth: Payer: Self-pay

## 2013-04-05 NOTE — Telephone Encounter (Signed)
Pt is needing to talk with dr Katrinka Blazingsmith :  She feels that she is getting the run around and is a little upset that she has seen a rhematologist and diagnosed with fibramyalgia and the rhematologist states that dr Katrinka Blazingsmith needs to handle treatment  And patient has been rescheduled twice and needs to talk about getting her meds refilled   Best number is [phone]

## 2013-04-06 NOTE — Telephone Encounter (Signed)
Called to check on status w/ins because have not gotten a decision. [employer] did not have a decision but was able to allow me to complete over the phone. PA approved from 02/06/13 - 04/07/14. Notified pharmacy.

## 2013-04-06 NOTE — Telephone Encounter (Signed)
FYI Lengthy conversation with pt. She is very upset over cancellation of her appt's and inability to get refills on her  Medication. I Transferred pt to [facility]- Pt is coming to the office on Sunday to meet with Dr. Katrinka BlazingSmith at 8am. We have advised the pt that we will fast track her due to the "run around" she feels that she has been getting. Pt is satisfied with this solution.

## 2013-04-06 NOTE — Telephone Encounter (Signed)
Please call patient

## 2013-04-08 ENCOUNTER — Other Ambulatory Visit: Payer: Self-pay | Admitting: Family Medicine

## 2013-04-08 NOTE — Telephone Encounter (Signed)
Pt will be in Sunday AM to see you- please address this at the same visit if possible.

## 2013-04-09 ENCOUNTER — Ambulatory Visit (INDEPENDENT_AMBULATORY_CARE_PROVIDER_SITE_OTHER): Payer: Federal, State, Local not specified - PPO | Admitting: Family Medicine

## 2013-04-09 VITALS — BP 132/82 | HR 72 | Temp 98.7°F | Resp 18 | Wt 155.0 lb

## 2013-04-09 DIAGNOSIS — F341 Dysthymic disorder: Secondary | ICD-10-CM

## 2013-04-09 DIAGNOSIS — IMO0001 Reserved for inherently not codable concepts without codable children: Secondary | ICD-10-CM

## 2013-04-09 DIAGNOSIS — M549 Dorsalgia, unspecified: Secondary | ICD-10-CM

## 2013-04-09 DIAGNOSIS — M797 Fibromyalgia: Secondary | ICD-10-CM

## 2013-04-09 DIAGNOSIS — G4700 Insomnia, unspecified: Secondary | ICD-10-CM | POA: Insufficient documentation

## 2013-04-09 DIAGNOSIS — G43909 Migraine, unspecified, not intractable, without status migrainosus: Secondary | ICD-10-CM

## 2013-04-09 DIAGNOSIS — Z1239 Encounter for other screening for malignant neoplasm of breast: Secondary | ICD-10-CM

## 2013-04-09 DIAGNOSIS — F411 Generalized anxiety disorder: Secondary | ICD-10-CM

## 2013-04-09 DIAGNOSIS — K219 Gastro-esophageal reflux disease without esophagitis: Secondary | ICD-10-CM

## 2013-04-09 MED ORDER — TRAMADOL HCL 50 MG PO TABS: 50.0000 mg | ORAL_TABLET | Freq: Four times a day (QID) | ORAL | Status: DC | PRN

## 2013-04-09 MED ORDER — TRAZODONE HCL 50 MG PO TABS: 50.0000 mg | ORAL_TABLET | Freq: Every evening | ORAL | Status: DC | PRN

## 2013-04-09 MED ORDER — CLONAZEPAM 0.5 MG PO TABS: 0.5000 mg | ORAL_TABLET | Freq: Two times a day (BID) | ORAL | Status: DC | PRN

## 2013-04-09 MED ORDER — ATENOLOL 25 MG PO TABS: 25.0000 mg | ORAL_TABLET | Freq: Every day | ORAL | Status: DC

## 2013-04-09 MED ORDER — ISOMETHEPTENE-APAP-DICHLORAL 65-325-100 MG PO CAPS: 1.0000 | ORAL_CAPSULE | Freq: Four times a day (QID) | ORAL | Status: DC | PRN

## 2013-04-09 MED ORDER — HYDROCODONE-ACETAMINOPHEN 5-325 MG PO TABS: 1.0000 | ORAL_TABLET | Freq: Four times a day (QID) | ORAL | Status: DC | PRN

## 2013-04-09 MED ORDER — BUPROPION HCL ER (XL) 300 MG PO TB24: 300.0000 mg | ORAL_TABLET | Freq: Every day | ORAL | Status: DC

## 2013-04-09 MED ORDER — CYCLOBENZAPRINE HCL 10 MG PO TABS: 10.0000 mg | ORAL_TABLET | Freq: Three times a day (TID) | ORAL | Status: DC | PRN

## 2013-04-09 MED ORDER — DULOXETINE HCL 60 MG PO CPEP: 60.0000 mg | ORAL_CAPSULE | Freq: Every day | ORAL | Status: DC

## 2013-04-09 MED ORDER — ESOMEPRAZOLE MAGNESIUM 40 MG PO PACK: 40.0000 mg | PACK | Freq: Every day | ORAL | Status: DC

## 2013-04-09 MED ORDER — CITALOPRAM HYDROBROMIDE 40 MG PO TABS: 20.0000 mg | ORAL_TABLET | Freq: Every day | ORAL | Status: DC

## 2013-04-09 MED ORDER — ZOLPIDEM TARTRATE 5 MG PO TABS: 5.0000 mg | ORAL_TABLET | Freq: Every evening | ORAL | Status: DC | PRN
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
## 2013-04-09 NOTE — Patient Instructions (Signed)
1. Decrease Citalopram 40mg  to 1 tablet daily x 2 weeks; then decrease to 1/2 tablet daily. 2. Start Cymbalta 60mg  one tablet daily.

## 2013-04-10 ENCOUNTER — Ambulatory Visit: Payer: Federal, State, Local not specified - PPO | Admitting: Family Medicine

## 2013-04-13 ENCOUNTER — Telehealth: Payer: Self-pay

## 2013-04-13 NOTE — Telephone Encounter (Signed)
Patient called stated she seen Dr. Katrinka BlazingSmith on Sunday, stated Dr. Katrinka BlazingSmith asked patient have she taken Nexum before (purple pill), She went to the pharmacy and the prescription came in powder form, patient want to know if Dr. Katrinka BlazingSmith ordered the medication like that.

## 2013-04-14 MED ORDER — ESOMEPRAZOLE MAGNESIUM 40 MG PO CPDR: 40.0000 mg | DELAYED_RELEASE_CAPSULE | Freq: Every day | ORAL | Status: DC

## 2013-04-14 NOTE — Telephone Encounter (Signed)
Pt.notified

## 2013-04-14 NOTE — Telephone Encounter (Signed)
I have sent the pills in - I think the packets were sent in by mistake.

## 2013-04-14 NOTE — Telephone Encounter (Signed)
Spoke to pt, she would rather have nexium in tablet form instead of powder form.  Please advise.

## 2013-04-29 ENCOUNTER — Ambulatory Visit: Payer: Federal, State, Local not specified - PPO

## 2013-04-29 ENCOUNTER — Ambulatory Visit (INDEPENDENT_AMBULATORY_CARE_PROVIDER_SITE_OTHER): Payer: Federal, State, Local not specified - PPO | Admitting: Family Medicine

## 2013-04-29 VITALS — BP 132/80 | HR 91 | Temp 98.7°F | Resp 16 | Ht 65.0 in | Wt 165.7 lb

## 2013-04-29 DIAGNOSIS — E7800 Pure hypercholesterolemia, unspecified: Secondary | ICD-10-CM

## 2013-04-29 DIAGNOSIS — M542 Cervicalgia: Secondary | ICD-10-CM

## 2013-04-29 DIAGNOSIS — E538 Deficiency of other specified B group vitamins: Secondary | ICD-10-CM

## 2013-04-29 DIAGNOSIS — S139XXA Sprain of joints and ligaments of unspecified parts of neck, initial encounter: Secondary | ICD-10-CM

## 2013-04-29 DIAGNOSIS — IMO0001 Reserved for inherently not codable concepts without codable children: Secondary | ICD-10-CM

## 2013-04-29 DIAGNOSIS — R413 Other amnesia: Secondary | ICD-10-CM

## 2013-04-29 DIAGNOSIS — J309 Allergic rhinitis, unspecified: Secondary | ICD-10-CM

## 2013-04-29 DIAGNOSIS — R2681 Unsteadiness on feet: Secondary | ICD-10-CM

## 2013-04-29 DIAGNOSIS — G4700 Insomnia, unspecified: Secondary | ICD-10-CM

## 2013-04-29 DIAGNOSIS — R269 Unspecified abnormalities of gait and mobility: Secondary | ICD-10-CM

## 2013-04-29 DIAGNOSIS — S161XXA Strain of muscle, fascia and tendon at neck level, initial encounter: Secondary | ICD-10-CM

## 2013-04-29 DIAGNOSIS — M797 Fibromyalgia: Secondary | ICD-10-CM

## 2013-04-29 LAB — POCT CBC
GRANULOCYTE PERCENT: 72.6 % (ref 37–80)
HEMATOCRIT: 36.4 % — AB (ref 37.7–47.9)
HEMOGLOBIN: 11.3 g/dL — AB (ref 12.2–16.2)
Lymph, poc: 2.2 (ref 0.6–3.4)
MCH, POC: 27 pg (ref 27–31.2)
MCHC: 31 g/dL — AB (ref 31.8–35.4)
MCV: 87 fL (ref 80–97)
MID (cbc): 0.3 (ref 0–0.9)
MPV: 8.3 fL (ref 0–99.8)
POC GRANULOCYTE: 6.7 (ref 2–6.9)
POC LYMPH PERCENT: 23.8 %L (ref 10–50)
POC MID %: 3.6 %M (ref 0–12)
Platelet Count, POC: 367 10*3/uL (ref 142–424)
RBC: 4.18 M/uL (ref 4.04–5.48)
RDW, POC: 14.6 %
WBC: 9.2 10*3/uL (ref 4.6–10.2)

## 2013-04-29 MED ORDER — CYANOCOBALAMIN 1000 MCG/ML IJ SOLN
1000.0000 ug | Freq: Once | INTRAMUSCULAR | Status: AC
Start: 2013-04-29 — End: 2013-04-29
  Administered 2013-04-29: 1000 ug via INTRAMUSCULAR

## 2013-04-29 MED ORDER — FLUTICASONE PROPIONATE 50 MCG/ACT NA SUSP: 2.0000 | Freq: Every day | NASAL | Status: DC

## 2013-04-29 NOTE — Patient Instructions (Signed)
1. Increase Cymbalta 60mg  to two tablets daily.  Take every morning.   2. Take Wellbutrin at lunch. 3. Keep diary of how you feel and what as needed medications that you take each day. 4. Start walking daily for exercise.

## 2013-04-29 NOTE — Progress Notes (Addendum)
Subjective:    Patient ID: Ronn Melena, female    DOB: [birth year], 63 y.o.   MRN: [id number]  HPI Chief Complaint  Patient presents with  . Fibromyalgia    since Monday   This chart was scribed for Ethelda Chick, MD by Andrew Au, ED Scribe. This patient was seen in room 1 and the patient's care was started at 4:58 PM.  HPI Comments: FLORINDA TAFLINGER is a 63 y.o. female who presents to the [facility] complaining of a fibromyalgia flare up onset 5 days. Pt reports that she is frustrated. She reports that she could not remember how to get to the clinic. She reports that her memory has worsened. She reports that when she woke up this morning she had soreness to posterior neck and pain between her shoulder blades with certain movements.  She reports that during this week she felt faint. She states that she checked her blood sugar which resulted  107. She reports some of her symptoms have gotten better and that some symptoms are returning. She reports once she elevates her self from sitting she has a hard time standing up. She states that the energy to stand up is weak. Pt reports that this has been a rough week for her.   Pt reports she no longer taking Celexa. She reports taking a whole cymbalta. Pt reports that she has gained weight. Pt reports that she has attended a zumba class for exercise. Pt denies starting physical therapy Pt reports that she takes tramadol, flexeril, and clonazepam at the same time at night before bed. Pt reports that she does not take ambien on a regular basis. Pt reports that she wakes up in the middle of the night anxious. Pt reports that she is stressed out from work. She reports that she stands all day at work. Pt reports that she tries to keep moving and is excited for warm weather so she can start walking. Pt reports that she takes clonazepam before work. Pt reports that she has been having problem with her speech and feels as if she cannot explain her  complaint. She reports that she has a hard time getting her words out.  Pt reports that she only takes muscle relaxer when need Pt reports that she takes Wellbutrin , cymbalta, estradiol, tremadol daily and Hydrocodone ,advil, zyrtec as needed.  Past Medical History  Diagnosis Date  . Menopause   . GERD (gastroesophageal reflux disease)   . Urinary incontinence   . Migraine   . Depression   . Anxiety   . Hypertension   . Thyroid disease   . Fibromyalgia 12/2012    s/p rheumatology consultation to confirm diagnosis; no evidence of autoimmune process.   Allergies  Allergen Reactions  . Amoxicillin     REACTION: Rash  . Metoclopramide Hcl     REACTION: Affected nerves  . Penicillins     REACTION: Rash  . Sulfamethoxazole     REACTION: Sores in mouth   Prior to Admission medications   Medication Sig Start Date End Date Taking? Authorizing Provider  atenolol (TENORMIN) 25 MG tablet Take 1 tablet (25 mg total) by mouth daily. 04/09/13   Ethelda Chick, MD  buPROPion (WELLBUTRIN XL) 300 MG 24 hr tablet Take 1 tablet (300 mg total) by mouth daily. 04/09/13   Ethelda Chick, MD  butalbital-acetaminophen-caffeine (FIORICET, ESGIC) [phone] MG per tablet TAKE ONE TABLET EVERY 6 HOURS AS NEEDED FOR HEADACHES 11/21/12  Ethelda Chick, MD  cetirizine (ZYRTEC) 10 MG tablet Take 1 tablet (10 mg total) by mouth daily as needed. 10/28/12   Tonye Pearson, MD  Cholecalciferol (VITAMIN D3) 3000 UNITS TABS Take 5,000 Units by mouth 2 (two) times daily.  04/21/11   Historical Provider, MD  citalopram (CELEXA) 40 MG tablet Take 0.5 tablets (20 mg total) by mouth daily. 04/09/13   Ethelda Chick, MD  clidinium-chlordiazePOXIDE (LIBRAX) 2.5-5 MG per capsule Take 1 capsule by mouth 4 (four) times daily -  before meals and at bedtime. 10/14/12   Ethelda Chick, MD  clonazePAM (KLONOPIN) 0.5 MG tablet Take 1 tablet (0.5 mg total) by mouth 2 (two) times daily as needed for anxiety. 04/09/13   Ethelda Chick, MD    cyanocobalamin (,VITAMIN B-12,) 1000 MCG/ML injection Inject 1 mL (1,000 mcg total) into the muscle every 14 (fourteen) days. 03/27/11   Edwyna Perfect, MD  cyclobenzaprine (FLEXERIL) 10 MG tablet Take 1 tablet (10 mg total) by mouth 3 (three) times daily as needed for muscle spasms. 1 morning, mid-afternoon and bedtime for muscle spasm 04/09/13   Ethelda Chick, MD  DULoxetine (CYMBALTA) 60 MG capsule Take 1 capsule (60 mg total) by mouth daily. 04/09/13   Ethelda Chick, MD  esomeprazole (NEXIUM) 40 MG capsule Take 1 capsule (40 mg total) by mouth daily at 12 noon. 04/14/13   Morrell Riddle, PA-C  estradiol (ESTRACE) 1 MG tablet Take 1 mg by mouth daily.    Historical Provider, MD  HYDROcodone-acetaminophen (NORCO/VICODIN) 5-325 MG per tablet Take 1 tablet by mouth every 6 (six) hours as needed for moderate pain. 04/09/13   Ethelda Chick, MD  ibuprofen (ADVIL,MOTRIN) 800 MG tablet Take 800 mg by mouth as needed.    Historical Provider, MD  isometheptene-acetaminophen-dichloralphenazone (MIDRIN) 65-325-100 MG capsule Take 1 capsule by mouth 4 (four) times daily as needed for migraine. Maximum 5 capsules in 12 hours for migraine headaches, 8 capsules in 24 hours for tension headaches. 04/09/13   Ethelda Chick, MD  Misc Natural Products (TART CHERRY ADVANCED PO) Take 1,200 mg by mouth as needed.    Historical Provider, MD  NEEDLE, DISP, 25 G (B-D DISP NEEDLE 25GX1") 25G X 1" MISC      Historical Provider, MD  NON FORMULARY Take 200 mg by mouth every morning. PREGNENOLONE (for memory and hormonal balance).    Historical Provider, MD  NON FORMULARY Take 165 mg by mouth daily. ALL ADRENAL RAW.    Historical Provider, MD  NON FORMULARY Take 360 mg by mouth as needed. SLIPPERY ELM    Historical Provider, MD  NON FORMULARY BROMELAIN & PAPAIN as needed for pain    Historical Provider, MD  NON FORMULARY ANDROGRAPHIS EXTRACT 400mg  as needed for immunity.    Historical Provider, MD  ondansetron (ZOFRAN) 4 MG tablet  Take 4-8 mg by mouth as directed. Every 4 to 6 hours as needed for nausea 03/27/11   Edwyna Perfect, MD  pantoprazole (PROTONIX) 40 MG tablet TAKE 1 TABLET BY MOUTH DAILY 10/11/12   Ethelda Chick, MD  Probiotic Product (PROBIOTIC DAILY PO) Take 1 capsule by mouth daily.    Historical Provider, MD  progesterone (PROMETRIUM) 100 MG capsule Take 100 mg by mouth At bedtime. 01/18/12   Historical Provider, MD  promethazine (PHENERGAN) 12.5 MG tablet Take 1 tablet (12.5 mg total) by mouth every 8 (eight) hours as needed for nausea or vomiting. 12/07/12   Silva Bandy  Dwan BoltM Malakai Schoenherr, MD  rizatriptan (MAXALT) 5 MG tablet Take 1 tablet (5 mg total) by mouth as needed for migraine. May repeat in 2 hours if needed 01/22/12   Maurice MarchBarbara B McPherson, MD  SUMAtriptan (IMITREX) 100 MG tablet TAKE 1 TABLET BY MOUTH AT ONSET OF HEADACHE, MAY REPEAT ONCE IF NEEDED 09/14/10   Damian LeavellWillie Ransom Stafford Jr., MD  Thyroid 48.75 MG TABS Take 1 tablet by mouth 2 (two) times daily.     Historical Provider, MD  traMADol (ULTRAM) 50 MG tablet Take 1 tablet (50 mg total) by mouth every 6 (six) hours as needed. 04/09/13   Ethelda ChickKristi M Beacher Every, MD  traZODone (DESYREL) 50 MG tablet Take 1-2 tablets (50-100 mg total) by mouth at bedtime as needed for sleep. 04/09/13   Ethelda ChickKristi M Kimberlye Dilger, MD  tretinoin (RETIN-A) 0.025 % cream Apply topically at bedtime. 07/06/12   Ethelda ChickKristi M Heba Ige, MD  valACYclovir (VALTREX) 500 MG tablet Take 1 tablet (500 mg total) by mouth 3 (three) times daily. For viral infection 03/27/11   Edwyna Perfecthomas W Hodgin, MD  zolpidem (AMBIEN) 5 MG tablet Take 1 tablet (5 mg total) by mouth at bedtime as needed for sleep. 04/09/13   Ethelda ChickKristi M Damarius Karnes, MD   Review of Systems  Constitutional: Positive for activity change and fatigue. Negative for fever, chills, diaphoresis, appetite change and unexpected weight change.  Respiratory: Negative for cough and shortness of breath.   Cardiovascular: Negative for chest pain, palpitations and leg swelling.  Musculoskeletal:  Positive for myalgias, back pain, arthralgias and neck pain. Negative for joint swelling.  Neurological: Positive for weakness, light-headedness and headaches. Negative for dizziness, tremors, seizures, syncope, facial asymmetry, speech difficulty and numbness.  Psychiatric/Behavioral: Positive for sleep disturbance and decreased concentration. Negative for suicidal ideas and self-injury. The patient is nervous/anxious.       Objective:   Physical Exam  Constitutional: She is oriented to person, place, and time. She appears well-developed and well-nourished. No distress.  HENT:  Head: Normocephalic and atraumatic.  Right Ear: External ear normal.  Left Ear: External ear normal.  Nose: Nose normal.  Mouth/Throat: Oropharynx is clear and moist.  Eyes: Conjunctivae and EOM are normal. Pupils are equal, round, and reactive to light.  Neck: Normal range of motion. Neck supple. Carotid bruit is not present. No thyromegaly present.  Cardiovascular: Normal rate, regular rhythm, normal heart sounds and intact distal pulses.  Exam reveals no gallop and no friction rub.   No murmur heard. Pulmonary/Chest: Effort normal and breath sounds normal. No respiratory distress. She has no wheezes. She has no rales.  Abdominal: Soft. Bowel sounds are normal. She exhibits no distension and no mass. There is no tenderness. There is no rebound and no guarding.  Musculoskeletal: Normal range of motion.  Lymphadenopathy:    She has no cervical adenopathy.  Neurological: She is alert and oriented to person, place, and time. No cranial nerve deficit. She exhibits normal muscle tone. Coordination normal.  Skin: Skin is warm and dry. No rash noted. She is not diaphoretic. No erythema. No pallor.  Psychiatric: She has a normal mood and affect. Her behavior is normal. Judgment and thought content normal.  Nursing note and vitals reviewed.  Results for orders placed in visit on 04/29/13  POCT CBC      Result Value Ref  Range   WBC 9.2  4.6 - 10.2 K/uL   Lymph, poc 2.2  0.6 - 3.4   POC LYMPH PERCENT 23.8  10 - 50 %L  MID (cbc) 0.3  0 - 0.9   POC MID % 3.6  0 - 12 %M   POC Granulocyte 6.7  2 - 6.9   Granulocyte percent 72.6  37 - 80 %G   RBC 4.18  4.04 - 5.48 M/uL   Hemoglobin 11.3 (*) 12.2 - 16.2 g/dL   HCT, POC 96.0 (*) 45.4 - 47.9 %   MCV 87.0  80 - 97 fL   MCH, POC 27.0  27 - 31.2 pg   MCHC 31.0 (*) 31.8 - 35.4 g/dL   RDW, POC 09.8     Platelet Count, POC 367  142 - 424 K/uL   MPV 8.3  0 - 99.8 fL   [facility] reading (PRIMARY) by  Dr. Katrinka Blazing. . Cervical spine films: mild degenerative changes      Assessment & Plan:  Fibromyalgia - Plan: Ambulatory referral to Physical Therapy  Neck pain - Plan: DG Cervical Spine Complete  Memory loss - Plan: POCT CBC, Comprehensive metabolic panel  Pure hypercholesterolemia - Plan: Lipid panel  B12 deficiency - Plan: cyanocobalamin ((VITAMIN B-12)) injection 1,000 mcg  Insomnia  Neck strain  Unsteady gait  Allergic rhinitis, cause unspecified   1.  Fibromyalgia: worsening; refer to PT.  Tolerating Cymbalta. Increase Cymbalta to 2 tablets daily. Start home exercise program. 2.  Neck pain/strain:  New.  Recommend continued muscle relaxer qhs; heat to area; refer to PT; recommend Tramadol PRN.  Home exercise program to perform daily.   3.  Memory loss: persistent by report; previous MMSE have been normal; obtain labs.  S/p MRI in past two years (08/23/12) for memory loss complaint and negative.  Normal TSH 12/07/2012.  Consider referral to neurology. 4. Hypercholesterolemia: stable; obtain labs per patient request. 5. Insomnia: stable on current regimen. 6.  Unsteady gait: Persistent;worsens with fibromyalgia; refer to PT; if no improvement with PT, refer to neurology. 7. Allergic Rhinitis: worsening; rx for Flonase provided.   Meds ordered this encounter  Medications  . cyanocobalamin ((VITAMIN B-12)) injection 1,000 mcg    Sig:   . fluticasone  (FLONASE) 50 MCG/ACT nasal spray    Sig: Place 2 sprays into both nostrils daily.    Dispense:  16 g    Refill:  6   I personally performed the services described in this documentation, which was scribed in my presence.  The recorded information has been reviewed and is accurate.  Nilda Simmer, M.D.  [facility] [address] [place], [place]  [address] [phone] phone [phone] fax

## 2013-04-30 LAB — COMPREHENSIVE METABOLIC PANEL
ALK PHOS: 48 U/L (ref 39–117)
ALT: 19 U/L (ref 0–35)
AST: 23 U/L (ref 0–37)
Albumin: 4 g/dL (ref 3.5–5.2)
BUN: 14 mg/dL (ref 6–23)
CALCIUM: 9.3 mg/dL (ref 8.4–10.5)
CO2: 25 meq/L (ref 19–32)
Chloride: 102 mEq/L (ref 96–112)
Creat: 0.9 mg/dL (ref 0.50–1.10)
GLUCOSE: 84 mg/dL (ref 70–99)
POTASSIUM: 4 meq/L (ref 3.5–5.3)
Sodium: 136 mEq/L (ref 135–145)
TOTAL PROTEIN: 6.6 g/dL (ref 6.0–8.3)
Total Bilirubin: 0.3 mg/dL (ref 0.2–1.2)

## 2013-04-30 LAB — LIPID PANEL
CHOLESTEROL: 221 mg/dL — AB (ref 0–200)
HDL: 54 mg/dL (ref >39)
LDL CALC: 137 mg/dL — AB (ref 0–99)
TRIGLYCERIDES: 151 mg/dL — AB (ref <150)
Total CHOL/HDL Ratio: 4.1 Ratio
VLDL: 30 mg/dL (ref 0–40)

## 2013-05-04 ENCOUNTER — Encounter: Payer: Self-pay | Admitting: Family Medicine

## 2013-05-04 NOTE — Telephone Encounter (Signed)
Pt calling about labs. Gave her all her results. She is concerned as to what she should do to increase her iron. She can't take iron pills because of her fibromyalgia. Do you have any recommendations as to certain foods she could eat to increase her iron level? She said she had her iron checked at another dr and the ferritin came back low. She also says she is still having extreme pain in her face, the "bones of her face". I told her to contact integrative therapies to make an appt. Please advise.

## 2013-05-05 ENCOUNTER — Other Ambulatory Visit: Payer: Self-pay | Admitting: Internal Medicine

## 2013-05-12 ENCOUNTER — Telehealth: Payer: Self-pay

## 2013-05-12 NOTE — Telephone Encounter (Signed)
Patient changed blood pressure medications.  Her blood pressure is low and she is unsteady on her feet.   Please call [phone]

## 2013-05-12 NOTE — Telephone Encounter (Signed)
Patient usually sees Dr. Katrinka BlazingSmith.  She felt dizzy and weak when she woke up this morning. Wants to discuss the med changes Dr. Katrinka BlazingSmith made the other day associated with her fibromyalgia.  She asked for Dr. Michaelle CopasSmith's schedule.  I told her that Dr. Katrinka BlazingSmith is off today but will be here Saturday from 10-7.  Advised patient that there are other doctors or PA's available if she would like to come today, however, she states that she will wait to see Dr.Smith on Saturday morning.

## 2013-05-13 ENCOUNTER — Ambulatory Visit (INDEPENDENT_AMBULATORY_CARE_PROVIDER_SITE_OTHER): Payer: Federal, State, Local not specified - PPO | Admitting: Family Medicine

## 2013-05-13 VITALS — BP 122/77 | HR 102 | Temp 98.0°F | Resp 16 | Ht 65.5 in | Wt 156.0 lb

## 2013-05-13 DIAGNOSIS — IMO0001 Reserved for inherently not codable concepts without codable children: Secondary | ICD-10-CM

## 2013-05-13 DIAGNOSIS — F341 Dysthymic disorder: Secondary | ICD-10-CM

## 2013-05-13 DIAGNOSIS — R42 Dizziness and giddiness: Secondary | ICD-10-CM

## 2013-05-13 DIAGNOSIS — D649 Anemia, unspecified: Secondary | ICD-10-CM

## 2013-05-13 DIAGNOSIS — M797 Fibromyalgia: Secondary | ICD-10-CM

## 2013-05-13 LAB — POCT URINALYSIS DIPSTICK
BILIRUBIN UA: NEGATIVE
Blood, UA: NEGATIVE
GLUCOSE UA: NEGATIVE
Leukocytes, UA: NEGATIVE
Nitrite, UA: NEGATIVE
Protein, UA: NEGATIVE
Urobilinogen, UA: 0.2
pH, UA: 5.5

## 2013-05-13 LAB — POCT CBC
Granulocyte percent: 70.4 %G (ref 37–80)
HCT, POC: 37.7 % (ref 37.7–47.9)
HEMOGLOBIN: 11.6 g/dL — AB (ref 12.2–16.2)
Lymph, poc: 1.5 (ref 0.6–3.4)
MCH, POC: 26.8 pg — AB (ref 27–31.2)
MCHC: 30.8 g/dL — AB (ref 31.8–35.4)
MCV: 87 fL (ref 80–97)
MID (cbc): 0.2 (ref 0–0.9)
MPV: 8.7 fL (ref 0–99.8)
POC Granulocyte: 4.1 (ref 2–6.9)
POC LYMPH PERCENT: 25.7 %L (ref 10–50)
POC MID %: 3.9 %M (ref 0–12)
Platelet Count, POC: 303 10*3/uL (ref 142–424)
RBC: 4.33 M/uL (ref 4.04–5.48)
RDW, POC: 15.2 %
WBC: 5.8 10*3/uL (ref 4.6–10.2)

## 2013-05-13 LAB — IRON AND TIBC
%SAT: 17 % — ABNORMAL LOW (ref 20–55)
IRON: 87 ug/dL (ref 42–145)
TIBC: 521 ug/dL — ABNORMAL HIGH (ref 250–470)
UIBC: 434 ug/dL — AB (ref 125–400)

## 2013-05-13 MED ORDER — BUPROPION HCL ER (XL) 150 MG PO TB24: 150.0000 mg | ORAL_TABLET | Freq: Every day | ORAL | Status: DC

## 2013-05-13 MED ORDER — DULOXETINE HCL 60 MG PO CPEP: 120.0000 mg | ORAL_CAPSULE | Freq: Every day | ORAL | Status: DC

## 2013-05-13 NOTE — Patient Instructions (Signed)
1. Increase water intake to 5 bottles of water daily. 2.  Decrease Wellbutrin dose to 150mg  daily; a new prescription has been sent to your pharmacy.

## 2013-05-13 NOTE — Progress Notes (Signed)
This chart was scribed for Mandy Chick, MD by Luisa Dago, ED Scribe. This patient was seen in room 9 and the patient's care was started at 10:52 AM. Subjective:    Patient ID: Mandy Diaz, female    DOB: [birth year], 63 y.o.   MRN: [id number]  05/13/2013  Fatigue   HPI HPI Comments: Mandy Diaz is a 63 y.o. female who presents to the [facility] complaining of worsening fatigue/weakness.   Pt was last seen by me two weeks ago, 04/29/13, for worsening fibromyalgia pain. Her Cymbalta dosage was increase to 120 MG daily. She called yesterday, saying that she felt dizzy and weak and she wanted to discuss her medication regimen.  Told pt to come in today for a follow up. Lab work at her last visit was normal, with normal blood sugar and kidney function. However, pt was slightly anemic with a hemoglobin of 11.3. Recommended that labs be repeated at her next visit.   Today, pt states that the last week she's been getting the feeling of weakness. She states that as she was getting ready for work she had to lean on the fridge to hold herself up. Pt reports taking her BP with measured readings of 126/78 and 103/75. She states that she could feel her "heart pulsing in her ear". She is also complaining of B anterior shin pain and leg cramps, which she took potassium pills for.  She called out of work yesterday due to feeling so weak.  Pt is also complaining of abdominal bloating that started about 1 week ago. She states that when she found out that she was slightly anemic she went out and bought liquid iron. But she states that she stopped taking the medication because it was causing her to experience lower B abdominal pain. She reports taking Librax on days that her abdominal pain gets really bad. Denies any diarrhea.    Pt reports that her last colonoscopy was within this past year by Dr. Matthias Hughs in 03/2012.  Retiring in July when turns 63.  Patient requesting out of work note for  the next three months.  Has a lot of anxiety about work; usually starts getting stressed about work the day before having to report to work.  Taking Klonopin 1/2 tablet on days of work but suffers with sedation.  Husband really feels that patient should stop work.  Missing work yesterday will be her third occurrence; she may be terminated.  Would like to avoid termination.  Would like to stay out of work until fibromyalgia medications have been optimized.  She has not scheduled appointment with [facility] yet.  She is not interested in psychotherapy to help cope with work.  They want me to learn how to pack a 30 pound backpack.   Review of Systems  Constitutional: Positive for fatigue. Negative for fever, chills and diaphoresis.  HENT: Negative for congestion.   Respiratory: Negative for cough and shortness of breath.   Gastrointestinal: Positive for abdominal pain. Negative for nausea, vomiting and diarrhea.  Neurological: Positive for dizziness and weakness. Negative for headaches.  Psychiatric/Behavioral: Positive for sleep disturbance and dysphoric mood. The patient is nervous/anxious.     Past Medical History  Diagnosis Date  . Menopause   . GERD (gastroesophageal reflux disease)   . Urinary incontinence   . Migraine   . Depression   . Anxiety   . Hypertension   . Thyroid disease   . Fibromyalgia 12/2012  s/p rheumatology consultation to confirm diagnosis; no evidence of autoimmune process.   Allergies  Allergen Reactions  . Amoxicillin     REACTION: Rash  . Metoclopramide Hcl     REACTION: Affected nerves  . Penicillins     REACTION: Rash  . Sulfamethoxazole     REACTION: Sores in mouth   Current Outpatient Prescriptions  Medication Sig Dispense Refill  . atenolol (TENORMIN) 25 MG tablet Take 1 tablet (25 mg total) by mouth daily.  30 tablet  6  . buPROPion (WELLBUTRIN XL) 150 MG 24 hr tablet Take 1 tablet (150 mg total) by mouth daily.  30 tablet  5  .  butalbital-acetaminophen-caffeine (FIORICET, ESGIC) 50-325-40 MG per tablet TAKE ONE TABLET EVERY 6 HOURS AS NEEDED FOR HEADACHES  45 tablet  2  . cetirizine (ZYRTEC) 10 MG tablet TAKE 1 TABLET BY MOUTH EVERY DAY AS NEEDED  30 tablet  5  . Cholecalciferol (VITAMIN D3) 3000 UNITS TABS Take 5,000 Units by mouth 2 (two) times daily.       . clidinium-chlordiazePOXIDE (LIBRAX) 2.5-5 MG per capsule Take 1 capsule by mouth 4 (four) times daily -  before meals and at bedtime.  120 capsule  6  . clonazePAM (KLONOPIN) 0.5 MG tablet Take 1 tablet (0.5 mg total) by mouth 2 (two) times daily as needed for anxiety.  60 tablet  5  . cyanocobalamin (,VITAMIN B-12,) 1000 MCG/ML injection Inject 1 mL (1,000 mcg total) into the muscle every 14 (fourteen) days.  2 mL  12  . cyclobenzaprine (FLEXERIL) 10 MG tablet Take 1 tablet (10 mg total) by mouth 3 (three) times daily as needed for muscle spasms. 1 morning, mid-afternoon and bedtime for muscle spasm  30 tablet  2  . DULoxetine (CYMBALTA) 60 MG capsule Take 2 capsules (120 mg total) by mouth daily.  60 capsule  11  . esomeprazole (NEXIUM) 40 MG capsule Take 1 capsule (40 mg total) by mouth daily at 12 noon.  30 capsule  11  . estradiol (ESTRACE) 1 MG tablet Take 1 mg by mouth daily.      . fluticasone (FLONASE) 50 MCG/ACT nasal spray Place 2 sprays into both nostrils daily.  16 g  6  . HYDROcodone-acetaminophen (NORCO/VICODIN) 5-325 MG per tablet Take 1 tablet by mouth every 6 (six) hours as needed for moderate pain.  30 tablet  0  . ibuprofen (ADVIL,MOTRIN) 800 MG tablet Take 800 mg by mouth as needed.      . isometheptene-acetaminophen-dichloralphenazone (MIDRIN) 65-325-100 MG capsule Take 1 capsule by mouth 4 (four) times daily as needed for migraine. Maximum 5 capsules in 12 hours for migraine headaches, 8 capsules in 24 hours for tension headaches.  40 capsule  0  . Misc Natural Products (TART CHERRY ADVANCED PO) Take 1,200 mg by mouth as needed.      Marland Kitchen. NEEDLE,  DISP, 25 G (B-D DISP NEEDLE 25GX1") 25G X 1" MISC        . NON FORMULARY Take 200 mg by mouth every morning. PREGNENOLONE (for memory and hormonal balance).      . NON FORMULARY Take 165 mg by mouth daily. ALL ADRENAL RAW.      Marland Kitchen. NON FORMULARY Take 360 mg by mouth as needed. SLIPPERY ELM      . NON FORMULARY BROMELAIN & PAPAIN as needed for pain      . NON FORMULARY ANDROGRAPHIS EXTRACT 400mg  as needed for immunity.      Marland Kitchen. ondansetron (ZOFRAN) 4  MG tablet Take 4-8 mg by mouth as directed. Every 4 to 6 hours as needed for nausea      . pantoprazole (PROTONIX) 40 MG tablet TAKE 1 TABLET BY MOUTH DAILY  30 tablet  11  . Probiotic Product (PROBIOTIC DAILY PO) Take 1 capsule by mouth daily.      . progesterone (PROMETRIUM) 100 MG capsule Take 100 mg by mouth At bedtime.      . promethazine (PHENERGAN) 12.5 MG tablet Take 1 tablet (12.5 mg total) by mouth every 8 (eight) hours as needed for nausea or vomiting.  30 tablet  0  . rizatriptan (MAXALT) 5 MG tablet Take 1 tablet (5 mg total) by mouth as needed for migraine. May repeat in 2 hours if needed  10 tablet  0  . SUMAtriptan (IMITREX) 100 MG tablet TAKE 1 TABLET BY MOUTH AT ONSET OF HEADACHE, MAY REPEAT ONCE IF NEEDED  9 tablet  5  . Thyroid 48.75 MG TABS Take 1 tablet by mouth 2 (two) times daily.       . traMADol (ULTRAM) 50 MG tablet Take 1 tablet (50 mg total) by mouth every 6 (six) hours as needed.  45 tablet  2  . traZODone (DESYREL) 50 MG tablet Take 1-2 tablets (50-100 mg total) by mouth at bedtime as needed for sleep.  60 tablet  5  . tretinoin (RETIN-A) 0.025 % cream Apply topically at bedtime.  45 g  1  . valACYclovir (VALTREX) 500 MG tablet Take 1 tablet (500 mg total) by mouth 3 (three) times daily. For viral infection  90 tablet  3  . zolpidem (AMBIEN) 5 MG tablet Take 1 tablet (5 mg total) by mouth at bedtime as needed for sleep.  30 tablet  5  . [DISCONTINUED] omeprazole (PRILOSEC) 40 MG capsule Take 1 capsule (40 mg total) by mouth 2  (two) times daily as needed. For severe GERD  60 capsule  11   No current facility-administered medications for this visit.       Objective:    BP 132/80  Pulse 94  Temp(Src) 98 F (36.7 C) (Oral)  Resp 16  Ht 5' 5.5" (1.664 m)  Wt 156 lb (70.761 kg)  BMI 25.56 kg/m2  SpO2 99%  Physical Exam  Nursing note and vitals reviewed. Constitutional: She is oriented to person, place, and time. She appears well-developed and well-nourished. No distress.  HENT:  Head: Normocephalic and atraumatic.  Nose: Right sinus exhibits maxillary sinus tenderness. Left sinus exhibits maxillary sinus tenderness.  Mouth/Throat: Oropharynx is clear and moist.  Eyes: Conjunctivae are normal. Pupils are equal, round, and reactive to light. Right eye exhibits no discharge. Left eye exhibits no discharge.  Neck: Normal range of motion. Neck supple. No thyromegaly present.  Cardiovascular: Normal rate, regular rhythm and normal heart sounds.  Exam reveals no gallop and no friction rub.   No murmur heard. Pulmonary/Chest: Effort normal and breath sounds normal. No respiratory distress. She has no wheezes. She has no rales.  Abdominal: Soft. Bowel sounds are normal. She exhibits no distension and no mass. There is no tenderness. There is no rebound and no guarding.  Musculoskeletal: She exhibits no edema and no tenderness.  No calf tenderness or swelling. Pt is tender to palpation to anterior shins.   Lymphadenopathy:    She has no cervical adenopathy.  Neurological: She is alert and oriented to person, place, and time. No cranial nerve deficit. She exhibits normal muscle tone. Coordination normal.  Skin:  Skin is warm and dry.  Psychiatric: She has a normal mood and affect. Her behavior is normal. Thought content normal.    Results for orders placed in visit on 05/13/13  POCT CBC      Result Value Ref Range   WBC 5.8  4.6 - 10.2 K/uL   Lymph, poc 1.5  0.6 - 3.4   POC LYMPH PERCENT 25.7  10 - 50 %L   MID  (cbc) 0.2  0 - 0.9   POC MID % 3.9  0 - 12 %M   POC Granulocyte 4.1  2 - 6.9   Granulocyte percent 70.4  37 - 80 %G   RBC 4.33  4.04 - 5.48 M/uL   Hemoglobin 11.6 (*) 12.2 - 16.2 g/dL   HCT, POC 16.1  09.6 - 47.9 %   MCV 87.0  80 - 97 fL   MCH, POC 26.8 (*) 27 - 31.2 pg   MCHC 30.8 (*) 31.8 - 35.4 g/dL   RDW, POC 04.5     Platelet Count, POC 303  142 - 424 K/uL   MPV 8.7  0 - 99.8 fL  POCT URINALYSIS DIPSTICK      Result Value Ref Range   Color, UA yellow     Clarity, UA clear     Glucose, UA neg     Bilirubin, UA neg     Ketones, UA trace     Spec Grav, UA >=1.030     Blood, UA neg     pH, UA 5.5     Protein, UA neg     Urobilinogen, UA 0.2     Nitrite, UA neg     Leukocytes, UA Negative         Assessment & Plan:   1. ANXIETY DEPRESSION   2. Fibromyalgia   3. Anemia   4. Dizziness    1. Anxiety and depression:  Uncontrolled; pt denied referral to psychotherapy and psychiatry.  Continue Cymbalta 120mg  daily.  Decrease Wellbutrin XL 150mg  daily.  Dizziness likely secondary to dehydration and medication side effect. Recommend Klonopin 1/2 tablet every morning for anxiety. Refused OOW note for 1-3 months; did provide with OOW note for yesterday. 2.  Fibromyalgia: uncontrolled; increased Cymbalta to 120mg  daily two weeks ago; continue current dose.  Highly encourage patient to start [facility] PT; pt has not initiated thus far.  Consider addition of Lyrica at follow-up visit. Refused writing patient out of work for 1-3 months.  OOW note for yesterday was provided. 3. Anemia: New.  Slightly improved at visit today; obtain iron studies; recommend iron rich foods.  Avoid iron supplementation due to GI side effects. Pt refused rectal with hemoccult today due to colonoscopy in 03/2012. 4. Dizziness:  New.  Normal neurological exam.  Urine concentrated and pulse increased with orthostatics.  Recommend increasing water intake over the next 72 hours.  Increase in Cymbalta may  also be contributing to dizziness; will decrease Wellbutrin to 150mg  daily.     Meds ordered this encounter  Medications  . buPROPion (WELLBUTRIN XL) 150 MG 24 hr tablet    Sig: Take 1 tablet (150 mg total) by mouth daily.    Dispense:  30 tablet    Refill:  5    TO REPLACE WELLBUTRIN XL 300MG  DAILY; PLEASE DELETE OTHER REFILLS FOR 300MG  WELLBUTRIN.  . DULoxetine (CYMBALTA) 60 MG capsule    Sig: Take 2 capsules (120 mg total) by mouth daily.    Dispense:  60 capsule  Refill:  11    No Follow-up on file.  I personally performed the services described in this documentation, which was scribed in my presence. The recorded information has been reviewed and is accurate.  Nilda Simmer, M.D.  [facility] [address] [place], [place]  [address] [phone] phone [phone] fax

## 2013-05-15 NOTE — Telephone Encounter (Signed)
Evaluated for follow-up on 05/13/13. No oral iron needed; recommend iron rich foods only.

## 2013-05-18 ENCOUNTER — Encounter: Payer: Self-pay | Admitting: Family Medicine

## 2013-12-14 ENCOUNTER — Other Ambulatory Visit: Payer: Self-pay | Admitting: Family Medicine

## 2014-01-03 ENCOUNTER — Telehealth: Payer: Self-pay | Admitting: Family Medicine

## 2014-01-03 NOTE — Telephone Encounter (Signed)
Yes, she needs evaluation for this, I have called her to advise. She will come in tomorrow to be evaluated at the [facility]

## 2014-01-03 NOTE — Telephone Encounter (Signed)
Patient states that her urine has a foul odor. She is experiencing pressure "where her uterus is". She wants to know if she can have medicine for that or if she can come in and leave a urine sample. Patient is going out of town on Friday. [facility] [address] Please advise  [phone]

## 2014-01-04 ENCOUNTER — Ambulatory Visit (INDEPENDENT_AMBULATORY_CARE_PROVIDER_SITE_OTHER): Payer: Federal, State, Local not specified - PPO | Admitting: Family Medicine

## 2014-01-04 VITALS — BP 136/80 | HR 103 | Temp 98.5°F | Resp 16 | Ht 65.25 in | Wt 161.8 lb

## 2014-01-04 DIAGNOSIS — G43709 Chronic migraine without aura, not intractable, without status migrainosus: Secondary | ICD-10-CM

## 2014-01-04 DIAGNOSIS — K219 Gastro-esophageal reflux disease without esophagitis: Secondary | ICD-10-CM

## 2014-01-04 DIAGNOSIS — R109 Unspecified abdominal pain: Secondary | ICD-10-CM

## 2014-01-04 DIAGNOSIS — F341 Dysthymic disorder: Secondary | ICD-10-CM

## 2014-01-04 DIAGNOSIS — J069 Acute upper respiratory infection, unspecified: Secondary | ICD-10-CM

## 2014-01-04 DIAGNOSIS — M546 Pain in thoracic spine: Secondary | ICD-10-CM

## 2014-01-04 DIAGNOSIS — R1032 Left lower quadrant pain: Secondary | ICD-10-CM

## 2014-01-04 DIAGNOSIS — F419 Anxiety disorder, unspecified: Secondary | ICD-10-CM

## 2014-01-04 DIAGNOSIS — K589 Irritable bowel syndrome without diarrhea: Secondary | ICD-10-CM

## 2014-01-04 DIAGNOSIS — G4700 Insomnia, unspecified: Secondary | ICD-10-CM

## 2014-01-04 DIAGNOSIS — Z124 Encounter for screening for malignant neoplasm of cervix: Secondary | ICD-10-CM

## 2014-01-04 DIAGNOSIS — M797 Fibromyalgia: Secondary | ICD-10-CM

## 2014-01-04 LAB — POCT UA - MICROSCOPIC ONLY
CASTS, UR, LPF, POC: NEGATIVE
Crystals, Ur, HPF, POC: NEGATIVE
RBC, urine, microscopic: NEGATIVE
WBC, Ur, HPF, POC: NEGATIVE
Yeast, UA: NEGATIVE

## 2014-01-04 LAB — POCT CBC
GRANULOCYTE PERCENT: 71.6 % (ref 37–80)
HCT, POC: 38.7 % (ref 37.7–47.9)
Hemoglobin: 12.4 g/dL (ref 12.2–16.2)
Lymph, poc: 2.3 (ref 0.6–3.4)
MCH, POC: 28.2 pg (ref 27–31.2)
MCHC: 32 g/dL (ref 31.8–35.4)
MCV: 88.1 fL (ref 80–97)
MID (cbc): 0.2 (ref 0–0.9)
MPV: 7.3 fL (ref 0–99.8)
POC Granulocyte: 6.3 (ref 2–6.9)
POC LYMPH PERCENT: 25.6 %L (ref 10–50)
POC MID %: 2.8 %M (ref 0–12)
Platelet Count, POC: 322 10*3/uL (ref 142–424)
RBC: 4.39 M/uL (ref 4.04–5.48)
RDW, POC: 14.2 %
WBC: 8.8 10*3/uL (ref 4.6–10.2)

## 2014-01-04 LAB — POCT URINALYSIS DIPSTICK
Bilirubin, UA: NEGATIVE
Blood, UA: NEGATIVE
Glucose, UA: NEGATIVE
Ketones, UA: NEGATIVE
Leukocytes, UA: NEGATIVE
Nitrite, UA: NEGATIVE
PH UA: 6
PROTEIN UA: NEGATIVE
SPEC GRAV UA: 1.02
Urobilinogen, UA: 0.2

## 2014-01-04 MED ORDER — FLUCONAZOLE 150 MG PO TABS: 150.0000 mg | ORAL_TABLET | Freq: Once | ORAL | Status: DC

## 2014-01-04 MED ORDER — CLONAZEPAM 0.5 MG PO TABS: 0.5000 mg | ORAL_TABLET | Freq: Two times a day (BID) | ORAL | Status: DC | PRN

## 2014-01-04 MED ORDER — VALACYCLOVIR HCL 500 MG PO TABS: 500.0000 mg | ORAL_TABLET | Freq: Three times a day (TID) | ORAL | Status: DC

## 2014-01-04 MED ORDER — CYCLOBENZAPRINE HCL 5 MG PO TABS: 5.0000 mg | ORAL_TABLET | Freq: Three times a day (TID) | ORAL | Status: DC | PRN

## 2014-01-04 MED ORDER — PROMETHAZINE HCL 12.5 MG PO TABS: 12.5000 mg | ORAL_TABLET | Freq: Three times a day (TID) | ORAL | Status: DC | PRN

## 2014-01-04 MED ORDER — ATENOLOL 25 MG PO TABS: 25.0000 mg | ORAL_TABLET | Freq: Every day | ORAL | Status: DC

## 2014-01-04 MED ORDER — DOXYCYCLINE HYCLATE 100 MG PO CAPS: 100.0000 mg | ORAL_CAPSULE | Freq: Two times a day (BID) | ORAL | Status: DC

## 2014-01-04 MED ORDER — CYANOCOBALAMIN 1000 MCG/ML IJ SOLN: 1000.0000 ug | INTRAMUSCULAR | Status: DC

## 2014-01-04 MED ORDER — BUTALBITAL-APAP-CAFFEINE 50-325-40 MG PO TABS: ORAL_TABLET | ORAL | Status: DC

## 2014-01-04 MED ORDER — ESOMEPRAZOLE MAGNESIUM 40 MG PO CPDR: 40.0000 mg | DELAYED_RELEASE_CAPSULE | Freq: Every day | ORAL | Status: DC

## 2014-01-04 MED ORDER — FLUTICASONE PROPIONATE 50 MCG/ACT NA SUSP
2.0000 | Freq: Every day | NASAL | Status: DC
Start: 2014-01-04 — End: 2014-06-23

## 2014-01-04 NOTE — Progress Notes (Addendum)
Patient ID: Mandy Diaz, female   DOB: January 25, 1950, 63 y.o.   MRN: [id number]    Subjective:    Patient ID: Mandy Diaz, female    DOB: 1950-02-07, 63 y.o.   MRN: [id number]  01/04/2014   This chart was scribed for Mandy Chick, MD by Ronney Lion, ED Scribe. This patient was seen in room 2 and the patient's care was started at 5:20 PM.   Abdominal Pain; sinus drainnage; Nausea; Headache; and Reflux   HPI   HPI Comments: Mandy Diaz is a 63 y.o. female who presents to [facility] for multiple complaints, including chronic pain near her hip at night that recently has radiated across her suprapubic abdomen and makes her feel bloated; a chronic, morning post-nasal drip that began to thicken 2 nights ago; intermittent nausea and vomiting; malodorous urine that began a week ago; and tenderness near her sinuses bilaterally. Patient confirms associated cough and mouth dryness, constipation that alternates with diarrhea, back pain that is separate from the abdominal pain she mentioned earlier, and some urinary frequency since she feels the need to get up at night to urinate. When discussing her thickening post-nasal drip, patient mentions that she has had sick contact with a friend Rudell Cobb). Her last BM was 2 days ago. She denies trying any medication for her symptoms. Patient also denies nasal congestion, sore throat, present diarrhea, bloody or black stools, dysuria, vaginal discharge, vaginal irritation, and fever (although she mentions having a temperature of 99.2 taken recently, which she does consider to be fever-grade).   When asked how she has been in the past six months, patient says with a smile, "I still have fibromyalgia." She plans to go to [place] to see Christmas decorations for her 45th anniversary tomorrow. She says that she hasn't hung Christmas decorations up this year because she hasn't had the energy to do so. Patient resigned from her job at the end of April because  she had been experiencing a lot of pain. She says that her son took her job for the last 2 months. He is in school to become a paramedic, and he currently lives with patient. He may want to become a physician assistant in the future.  Patient requests to have a pap smear done in addition to the pelvic exam; her last pap smear was done several years ago, though she can't remember exactly when.   Within the past month, pt notes that she had become very irritable towards family; thus, she increased her Wellbutrin back to 300mg  daily with improvement.  Emotionally stable now; decrease stress since quitting work.  Requesting TSH being checked; taking thyroid supplement natural from another physician.  Son has also noticed tachycardia at times and wants heart evaluation.  Pt also complains of worsening GERD symptoms on current PPI.  Requesting return to Nexium.  Review of Systems  Constitutional: Positive for fever. Negative for chills, diaphoresis and fatigue.  HENT: Positive for postnasal drip, sinus pressure and voice change. Negative for congestion, ear pain, rhinorrhea, sore throat and trouble swallowing.   Respiratory: Positive for cough. Negative for shortness of breath.   Cardiovascular: Negative for chest pain, palpitations and leg swelling.  Gastrointestinal: Positive for nausea, vomiting, abdominal pain and constipation. Negative for diarrhea, blood in stool and rectal pain.  Genitourinary: Positive for frequency and pelvic pain. Negative for dysuria, urgency, hematuria, flank pain, decreased urine volume, vaginal bleeding, vaginal discharge, genital sores and vaginal pain.  Musculoskeletal: Positive for  myalgias, back pain and arthralgias.  Skin: Negative for rash.  Neurological: Positive for headaches. Negative for dizziness, tremors, seizures, syncope, facial asymmetry, speech difficulty, weakness, light-headedness and numbness.  Psychiatric/Behavioral: Positive for sleep disturbance and  dysphoric mood. Negative for suicidal ideas and self-injury. The patient is nervous/anxious.     Past Medical History  Diagnosis Date  . Menopause   . GERD (gastroesophageal reflux disease)   . Urinary incontinence   . Migraine   . Depression   . Anxiety   . Hypertension   . Thyroid disease   . Fibromyalgia 12/2012    s/p rheumatology consultation to confirm diagnosis; no evidence of autoimmune process.   Past Surgical History  Procedure Laterality Date  . Cholecystectomy     Allergies  Allergen Reactions  . Amoxicillin     REACTION: Rash  . Metoclopramide Hcl     REACTION: Affected nerves  . Penicillins     REACTION: Rash  . Sulfamethoxazole     REACTION: Sores in mouth   Current Outpatient Prescriptions  Medication Sig Dispense Refill  . atenolol (TENORMIN) 25 MG tablet Take 1 tablet (25 mg total) by mouth daily. 30 tablet 6  . buPROPion (WELLBUTRIN XL) 150 MG 24 hr tablet Take 1 tablet (150 mg total) by mouth daily. 30 tablet 5  . butalbital-acetaminophen-caffeine (FIORICET, ESGIC) 50-325-40 MG per tablet TAKE ONE TABLET EVERY 6 HOURS AS NEEDED FOR HEADACHES 45 tablet 2  . Cholecalciferol (VITAMIN D3) 3000 UNITS TABS Take 5,000 Units by mouth 2 (two) times daily.     . clidinium-chlordiazePOXIDE (LIBRAX) 2.5-5 MG per capsule Take 1 capsule by mouth 4 (four) times daily -  before meals and at bedtime. 120 capsule 6  . clonazePAM (KLONOPIN) 0.5 MG tablet Take 1 tablet (0.5 mg total) by mouth 2 (two) times daily as needed for anxiety. 60 tablet 5  . cyanocobalamin (,VITAMIN B-12,) 1000 MCG/ML injection Inject 1 mL (1,000 mcg total) into the muscle every 14 (fourteen) days. 2 mL 12  . cyclobenzaprine (FLEXERIL) 5 MG tablet Take 1 tablet (5 mg total) by mouth 3 (three) times daily as needed for muscle spasms. 1 morning, mid-afternoon and bedtime for muscle spasm 30 tablet 2  . DULoxetine (CYMBALTA) 60 MG capsule Take 2 capsules (120 mg total) by mouth daily. 60 capsule 11  .  estradiol (ESTRACE) 1 MG tablet Take 1 mg by mouth daily.    Marland Kitchen. HYDROcodone-acetaminophen (NORCO/VICODIN) 5-325 MG per tablet Take 1 tablet by mouth every 6 (six) hours as needed for moderate pain. 30 tablet 0  . ibuprofen (ADVIL,MOTRIN) 800 MG tablet Take 800 mg by mouth as needed.    Marland Kitchen. NEEDLE, DISP, 25 G (B-D DISP NEEDLE 25GX1") 25G X 1" MISC      . NON FORMULARY Take 200 mg by mouth every morning. PREGNENOLONE (for memory and hormonal balance).    . NON FORMULARY Take 165 mg by mouth daily. ALL ADRENAL RAW.    . Probiotic Product (PROBIOTIC DAILY PO) Take 1 capsule by mouth daily.    . progesterone (PROMETRIUM) 100 MG capsule Take 100 mg by mouth At bedtime.    . promethazine (PHENERGAN) 12.5 MG tablet Take 1 tablet (12.5 mg total) by mouth every 8 (eight) hours as needed for nausea or vomiting. 30 tablet 0  . rizatriptan (MAXALT) 5 MG tablet Take 1 tablet (5 mg total) by mouth as needed for migraine. May repeat in 2 hours if needed 10 tablet 0  . SUMAtriptan (IMITREX) 100  MG tablet TAKE 1 TABLET BY MOUTH AT ONSET OF HEADACHE, MAY REPEAT ONCE IF NEEDED 9 tablet 5  . Thyroid 48.75 MG TABS Take 1 tablet by mouth 2 (two) times daily.     Marland Kitchen tretinoin (RETIN-A) 0.025 % cream Apply topically at bedtime. 45 g 1  . valACYclovir (VALTREX) 500 MG tablet Take 1 tablet (500 mg total) by mouth 3 (three) times daily. For viral infection 90 tablet 3  . cetirizine (ZYRTEC) 10 MG tablet TAKE 1 TABLET BY MOUTH EVERY DAY AS NEEDED (Patient not taking: Reported on 01/04/2014) 30 tablet 5  . doxycycline (VIBRAMYCIN) 100 MG capsule Take 1 capsule (100 mg total) by mouth 2 (two) times daily. 20 capsule 0  . esomeprazole (NEXIUM) 40 MG capsule Take 1 capsule (40 mg total) by mouth daily at 12 noon. 30 capsule 11  . fluconazole (DIFLUCAN) 150 MG tablet Take 1 tablet (150 mg total) by mouth once. Repeat if needed 2 tablet 0  . fluticasone (FLONASE) 50 MCG/ACT nasal spray Place 2 sprays into both nostrils daily. 16 g 11    . isometheptene-acetaminophen-dichloralphenazone (MIDRIN) 65-325-100 MG capsule Take 1 capsule by mouth 4 (four) times daily as needed for migraine. Maximum 5 capsules in 12 hours for migraine headaches, 8 capsules in 24 hours for tension headaches. 40 capsule 0  . Misc Natural Products (TART CHERRY ADVANCED PO) Take 1,200 mg by mouth as needed.    . NON FORMULARY Take 360 mg by mouth as needed. SLIPPERY ELM    . NON FORMULARY BROMELAIN & PAPAIN as needed for pain    . NON FORMULARY ANDROGRAPHIS EXTRACT 400mg  as needed for immunity.    Marland Kitchen ondansetron (ZOFRAN) 4 MG tablet Take 4-8 mg by mouth as directed. Every 4 to 6 hours as needed for nausea    . pantoprazole (PROTONIX) 40 MG tablet TAKE 1 TABLET BY MOUTH DAILY (Patient not taking: Reported on 01/04/2014) 30 tablet 11  . traMADol (ULTRAM) 50 MG tablet TAKE 1 TABLET BY MOUTH EVERY 6 HOURS AS NEEDED 45 tablet 0  . traZODone (DESYREL) 50 MG tablet Take 1-2 tablets by mouth at bedtime as needed for sleep. 60 tablet 5  . zolpidem (AMBIEN) 5 MG tablet Take 1 tablet (5 mg total) by mouth at bedtime as needed for sleep. (Patient not taking: Reported on 01/04/2014) 30 tablet 5  . [DISCONTINUED] omeprazole (PRILOSEC) 40 MG capsule Take 1 capsule (40 mg total) by mouth 2 (two) times daily as needed. For severe GERD 60 capsule 11   No current facility-administered medications for this visit.       Objective:    BP 136/80 mmHg  Pulse 103  Temp(Src) 98.5 F (36.9 C) (Oral)  Resp 16  Ht 5' 5.25" (1.657 m)  Wt 161 lb 12.8 oz (73.392 kg)  BMI 26.73 kg/m2  SpO2 99% Physical Exam  Constitutional: She is oriented to person, place, and time. She appears well-developed and well-nourished. No distress.  HENT:  Head: Normocephalic and atraumatic.  Right Ear: External ear normal.  Left Ear: External ear normal.  Nose: Right sinus exhibits maxillary sinus tenderness and frontal sinus tenderness. Left sinus exhibits maxillary sinus tenderness and frontal  sinus tenderness.  Mouth/Throat: No oropharyngeal exudate.  Diffusely erythematous throat.  Eyes: Conjunctivae and EOM are normal.  Neck: Neck supple. No tracheal deviation present.  Cardiovascular: Normal rate.   Pulmonary/Chest: Effort normal. No respiratory distress.  Abdominal: Soft. Bowel sounds are normal. She exhibits no distension and no mass. There  is tenderness. There is no rebound and no guarding.  Tenderness to palpation in LLQ, no guarding or rebound.  Genitourinary: Vagina normal and uterus normal. Cervix exhibits no motion tenderness, no discharge and no friability. Right adnexum displays no mass, no tenderness and no fullness. Left adnexum displays tenderness. Left adnexum displays no mass and no fullness.  Musculoskeletal: Normal range of motion.  Neurological: She is alert and oriented to person, place, and time.  Skin: Skin is warm and dry. She is not diaphoretic.  Psychiatric: She has a normal mood and affect. Her behavior is normal.  Nursing note and vitals reviewed.  Results for orders placed or performed in visit on 01/04/14  Urine culture  Result Value Ref Range   Colony Count NO GROWTH    Organism ID, Bacteria NO GROWTH   Comprehensive metabolic panel  Result Value Ref Range   Sodium 137 135 - 145 mEq/L   Potassium 3.9 3.5 - 5.3 mEq/L   Chloride 103 96 - 112 mEq/L   CO2 24 19 - 32 mEq/L   Glucose, Bld 95 70 - 99 mg/dL   BUN 8 6 - 23 mg/dL   Creat 1.610.81 0.960.50 - 0.451.10 mg/dL   Total Bilirubin 0.4 0.2 - 1.2 mg/dL   Alkaline Phosphatase 45 39 - 117 U/L   AST 16 0 - 37 U/L   ALT 11 0 - 35 U/L   Total Protein 6.3 6.0 - 8.3 g/dL   Albumin 3.9 3.5 - 5.2 g/dL   Calcium 9.1 8.4 - 40.910.5 mg/dL  TSH  Result Value Ref Range   TSH 0.719 0.350 - 4.500 uIU/mL  POCT urinalysis dipstick  Result Value Ref Range   Color, UA yellow    Clarity, UA clear    Glucose, UA negative    Bilirubin, UA negative    Ketones, UA negative    Spec Grav, UA 1.020    Blood, UA negative      pH, UA 6.0    Protein, UA negative    Urobilinogen, UA 0.2    Nitrite, UA negative    Leukocytes, UA Negative   POCT UA - Microscopic Only  Result Value Ref Range   WBC, Ur, HPF, POC negative    RBC, urine, microscopic negative    Bacteria, U Microscopic trace    Mucus, UA trace    Epithelial cells, urine per micros 0-3    Crystals, Ur, HPF, POC negative    Casts, Ur, LPF, POC negative    Yeast, UA negative   POCT CBC  Result Value Ref Range   WBC 8.8 4.6 - 10.2 K/uL   Lymph, poc 2.3 0.6 - 3.4   POC LYMPH PERCENT 25.6 10 - 50 %L   MID (cbc) 0.2 0 - 0.9   POC MID % 2.8 0 - 12 %M   POC Granulocyte 6.3 2 - 6.9   Granulocyte percent 71.6 37 - 80 %G   RBC 4.39 4.04 - 5.48 M/uL   Hemoglobin 12.4 12.2 - 16.2 g/dL   HCT, POC 81.138.7 91.437.7 - 47.9 %   MCV 88.1 80 - 97 fL   MCH, POC 28.2 27 - 31.2 pg   MCHC 32.0 31.8 - 35.4 g/dL   RDW, POC 78.214.2 %   Platelet Count, POC 322 142 - 424 K/uL   MPV 7.3 0 - 99.8 fL  Pap IG and HPV (high risk) DNA detection  Result Value Ref Range   HPV DNA High Risk Not Detected  Specimen adequacy: SEE NOTE    FINAL DIAGNOSIS: SEE NOTE    Cytotechnologist: SEE NOTE        Assessment & Plan:   1. Abdominal pain, unspecified abdominal location   2. Screening for cervical cancer   3. Abdominal pain, LLQ   4. Anxiety   5. URI (upper respiratory infection)   6. Midline thoracic back pain   7. Chronic migraine without aura without status migrainosus, not intractable   8. Irritable bowel syndrome   9. Insomnia   10. Gastroesophageal reflux disease without esophagitis   11. Fibromyalgia   12. ANXIETY DEPRESSION     1. URI:  New. With worsening to sinusitis; treat with Doxycycline; recommend restarting flonase and zyrtec or antihistamine of choice.  Also recommend nasal saline spray qid. 2.  Screening cervical cancer: pap smear obtained; pelvic exam performed. 3.  LLQ abdominal pain:  New.  Obtain urine culture.  Obtain labs  May be secondary to  constipation; will obtain pelvic US to evaluate L adnexal region. 4.  Anxiety: worsening recently thus increased Wellbutrin to 300mg  daily; continue at current dose; continue Cymbalta 120mg  daily. 5.  Thoracic back pain:  New.  Recommend rest, heat to area, stretching. Avoid heavy lifting for two weeks. 6.  Fibromyalgia: persistent; continue Cymbalta 120mg  daily; consider adding Lyrica as well.  Recommend regular exercise and stretching.  Resigned from job since last visit six months ago.   7. Migraines: stable; requesting refill of medications. 8.  GERD: uncontrolled; switch to Nexium 40mg  daily.  Dietary modification reviewed. 9. Insomnia: stable; refill provided.   Meds ordered this encounter  Medications  . fluticasone (FLONASE) 50 MCG/ACT nasal spray    Sig: Place 2 sprays into both nostrils daily.    Dispense:  16 g    Refill:  11  . esomeprazole (NEXIUM) 40 MG capsule    Sig: Take 1 capsule (40 mg total) by mouth daily at 12 noon.    Dispense:  30 capsule    Refill:  11  . doxycycline (VIBRAMYCIN) 100 MG capsule    Sig: Take 1 capsule (100 mg total) by mouth 2 (two) times daily.    Dispense:  20 capsule    Refill:  0  . fluconazole (DIFLUCAN) 150 MG tablet    Sig: Take 1 tablet (150 mg total) by mouth once. Repeat if needed    Dispense:  2 tablet    Refill:  0  . butalbital-acetaminophen-caffeine (FIORICET, ESGIC) 50-325-40 MG per tablet    Sig: TAKE ONE TABLET EVERY 6 HOURS AS NEEDED FOR HEADACHES    Dispense:  45 tablet    Refill:  2  . atenolol (TENORMIN) 25 MG tablet    Sig: Take 1 tablet (25 mg total) by mouth daily.    Dispense:  30 tablet    Refill:  6  . clonazePAM (KLONOPIN) 0.5 MG tablet    Sig: Take 1 tablet (0.5 mg total) by mouth 2 (two) times daily as needed for anxiety.    Dispense:  60 tablet    Refill:  5  . cyanocobalamin (,VITAMIN B-12,) 1000 MCG/ML injection    Sig: Inject 1 mL (1,000 mcg total) into the muscle every 14 (fourteen) days.    Dispense:   2 mL    Refill:  12  . cyclobenzaprine (FLEXERIL) 5 MG tablet    Sig: Take 1 tablet (5 mg total) by mouth 3 (three) times daily as needed for muscle spasms. 1 morning, mid-afternoon and  bedtime for muscle spasm    Dispense:  30 tablet    Refill:  2  . promethazine (PHENERGAN) 12.5 MG tablet    Sig: Take 1 tablet (12.5 mg total) by mouth every 8 (eight) hours as needed for nausea or vomiting.    Dispense:  30 tablet    Refill:  0  . valACYclovir (VALTREX) 500 MG tablet    Sig: Take 1 tablet (500 mg total) by mouth 3 (three) times daily. For viral infection    Dispense:  90 tablet    Refill:  3    No Follow-up on file.     I personally performed the services described in this documentation, which was scribed in my presence. The recorded information has been reviewed and considered.  Nilda Simmer, M.D.  [facility] [address] [place], [place]  [address] [phone] phone [phone] fax

## 2014-01-04 NOTE — Patient Instructions (Signed)
1.  TAKE MIRALAX ONCE DAILY FOR CONSTIPATION.

## 2014-01-05 LAB — COMPREHENSIVE METABOLIC PANEL
ALT: 11 U/L (ref 0–35)
AST: 16 U/L (ref 0–37)
Albumin: 3.9 g/dL (ref 3.5–5.2)
Alkaline Phosphatase: 45 U/L (ref 39–117)
BUN: 8 mg/dL (ref 6–23)
CHLORIDE: 103 meq/L (ref 96–112)
CO2: 24 meq/L (ref 19–32)
Calcium: 9.1 mg/dL (ref 8.4–10.5)
Creat: 0.81 mg/dL (ref 0.50–1.10)
Glucose, Bld: 95 mg/dL (ref 70–99)
POTASSIUM: 3.9 meq/L (ref 3.5–5.3)
Sodium: 137 mEq/L (ref 135–145)
Total Bilirubin: 0.4 mg/dL (ref 0.2–1.2)
Total Protein: 6.3 g/dL (ref 6.0–8.3)

## 2014-01-05 LAB — TSH: TSH: 0.719 u[IU]/mL (ref 0.350–4.500)

## 2014-01-05 LAB — URINE CULTURE
Colony Count: NO GROWTH
Organism ID, Bacteria: NO GROWTH

## 2014-01-08 ENCOUNTER — Other Ambulatory Visit: Payer: Self-pay | Admitting: Family Medicine

## 2014-01-08 DIAGNOSIS — R1032 Left lower quadrant pain: Secondary | ICD-10-CM

## 2014-01-08 LAB — PAP IG AND HPV HIGH-RISK: HPV DNA HIGH RISK: NOT DETECTED

## 2014-01-15 ENCOUNTER — Telehealth: Payer: Self-pay | Admitting: Family Medicine

## 2014-01-15 ENCOUNTER — Other Ambulatory Visit: Payer: Self-pay | Admitting: Family Medicine

## 2014-01-15 MED ORDER — TRAZODONE HCL 50 MG PO TABS: ORAL_TABLET | ORAL | Status: DC

## 2014-01-15 NOTE — Telephone Encounter (Signed)
Called in  Pt notified

## 2014-01-15 NOTE — Telephone Encounter (Signed)
Please call in refill of Tramadol as approved. 

## 2014-01-15 NOTE — Telephone Encounter (Signed)
Sent RFs of trazadone. Notified pt on MyChart

## 2014-01-15 NOTE — Telephone Encounter (Signed)
traMADol (ULTRAM) 50 MG tablet [[id number]][[id number]] : pt states that she was here on 12/17 to have this refilled

## 2014-01-15 NOTE — Telephone Encounter (Signed)
Dr Katrinka BlazingSmith, I don't see that you have Rxd this for pt since March. Do you want to Rx this for her? Pended w/ 5 RFs.

## 2014-01-16 ENCOUNTER — Other Ambulatory Visit: Payer: Self-pay | Admitting: Family Medicine

## 2014-01-16 MED ORDER — ISOMETHEPTENE-APAP-DICHLORAL 65-325-100 MG PO CAPS: 1.0000 | ORAL_CAPSULE | Freq: Four times a day (QID) | ORAL | Status: DC | PRN

## 2014-01-16 NOTE — Telephone Encounter (Signed)
Called in. Pt notified on MyChart.

## 2014-01-16 NOTE — Telephone Encounter (Signed)
OK to call in single refill of Midrin to pharmacy as approved (not 5 refills).  Thanks!

## 2014-01-18 ENCOUNTER — Telehealth: Payer: Self-pay | Admitting: Family Medicine

## 2014-01-18 NOTE — Telephone Encounter (Signed)
Pt will call back when she has her calender book.

## 2014-01-18 NOTE — Telephone Encounter (Signed)
-----   Message from Mandy ChickKristi M Smith, MD sent at 01/16/2014  1:40 PM EST ----- Please call patient for appointment in three months for recheck.

## 2014-01-23 ENCOUNTER — Ambulatory Visit
Admission: RE | Admit: 2014-01-23 | Discharge: 2014-01-23 | Disposition: A | Discharge status: Home / Self Care | Payer: Federal, State, Local not specified - PPO | Source: Ambulatory Visit | Attending: Family Medicine | Admitting: Family Medicine

## 2014-01-23 DIAGNOSIS — R1032 Left lower quadrant pain: Secondary | ICD-10-CM

## 2014-01-24 ENCOUNTER — Ambulatory Visit: Payer: Federal, State, Local not specified - PPO | Admitting: *Deleted

## 2014-01-24 ENCOUNTER — Ambulatory Visit (INDEPENDENT_AMBULATORY_CARE_PROVIDER_SITE_OTHER): Payer: Federal, State, Local not specified - PPO | Admitting: *Deleted

## 2014-01-24 DIAGNOSIS — Z23 Encounter for immunization: Secondary | ICD-10-CM

## 2014-02-23 ENCOUNTER — Other Ambulatory Visit: Payer: Self-pay

## 2014-02-23 MED ORDER — ATENOLOL 25 MG PO TABS: 25.0000 mg | ORAL_TABLET | Freq: Every day | ORAL | Status: DC

## 2014-02-23 MED ORDER — BUPROPION HCL ER (XL) 300 MG PO TB24: 300.0000 mg | ORAL_TABLET | Freq: Every day | ORAL | Status: DC

## 2014-02-23 MED ORDER — TRAZODONE HCL 50 MG PO TABS: ORAL_TABLET | ORAL | Status: DC

## 2014-02-23 MED ORDER — DULOXETINE HCL 60 MG PO CPEP: 120.0000 mg | ORAL_CAPSULE | Freq: Every day | ORAL | Status: DC

## 2014-02-23 MED ORDER — ESOMEPRAZOLE MAGNESIUM 40 MG PO CPDR: 40.0000 mg | DELAYED_RELEASE_CAPSULE | Freq: Every day | ORAL | Status: DC

## 2014-02-23 MED ORDER — CYANOCOBALAMIN 1000 MCG/ML IJ SOLN: 1000.0000 ug | INTRAMUSCULAR | Status: AC

## 2014-02-23 NOTE — Telephone Encounter (Signed)
Pharm sent reqs for 90 day supplies. Re-sent remaining RFs in 90 day supplies and/or extended/changed sig per 01/04/14 OV notes

## 2014-03-11 ENCOUNTER — Other Ambulatory Visit: Payer: Self-pay | Admitting: Family Medicine

## 2014-03-13 NOTE — Telephone Encounter (Signed)
Pt just had a normal TSH reading in Dec and this is the dose in EPIC, but your notes say another provider Rxd it? I don't see that you have bf. Do you want to?

## 2014-03-16 ENCOUNTER — Other Ambulatory Visit: Payer: Self-pay | Admitting: Family Medicine

## 2014-03-31 ENCOUNTER — Other Ambulatory Visit: Payer: Self-pay | Admitting: Family Medicine

## 2014-04-01 NOTE — Telephone Encounter (Signed)
Do you want to give pt RFs? 

## 2014-04-23 ENCOUNTER — Other Ambulatory Visit: Payer: Self-pay | Admitting: Family Medicine

## 2014-04-24 NOTE — Telephone Encounter (Signed)
Please call in refill of Tramadol as approved. 

## 2014-04-24 NOTE — Telephone Encounter (Signed)
Called in tramadol .  

## 2014-06-18 ENCOUNTER — Other Ambulatory Visit: Payer: Self-pay | Admitting: Family Medicine

## 2014-06-19 ENCOUNTER — Telehealth: Payer: Self-pay

## 2014-06-19 NOTE — Telephone Encounter (Signed)
Not sure what happened. Please advise on refill.

## 2014-06-19 NOTE — Telephone Encounter (Signed)
SMITH - Pt is completely out of her estrogen.  She thought she had one more refill, but she does not.  She has not had it all weekend.  Says the pharmacy was supposed to have faxed over a refill request for this.  [phone]

## 2014-06-19 NOTE — Telephone Encounter (Signed)
Dr Katrinka BlazingSmith, I don't see that you have Rxd this for pt before, but did discuss related Sxs at last OV. Do you want to Rx?

## 2014-06-20 ENCOUNTER — Other Ambulatory Visit: Payer: Self-pay | Admitting: Family Medicine

## 2014-06-20 ENCOUNTER — Encounter: Payer: Self-pay | Admitting: Family Medicine

## 2014-06-20 ENCOUNTER — Ambulatory Visit (INDEPENDENT_AMBULATORY_CARE_PROVIDER_SITE_OTHER): Payer: Federal, State, Local not specified - PPO | Admitting: Family Medicine

## 2014-06-20 VITALS — BP 128/73 | HR 90 | Temp 98.3°F | Resp 16 | Ht 65.25 in | Wt 160.2 lb

## 2014-06-20 DIAGNOSIS — Z79890 Hormone replacement therapy (postmenopausal): Secondary | ICD-10-CM | POA: Diagnosis not present

## 2014-06-20 DIAGNOSIS — M797 Fibromyalgia: Secondary | ICD-10-CM

## 2014-06-20 DIAGNOSIS — I10 Essential (primary) hypertension: Secondary | ICD-10-CM

## 2014-06-20 MED ORDER — ATENOLOL 25 MG PO TABS: 25.0000 mg | ORAL_TABLET | Freq: Every day | ORAL | Status: DC

## 2014-06-20 MED ORDER — ESTRADIOL 1 MG PO TABS: 1.0000 mg | ORAL_TABLET | Freq: Every day | ORAL | Status: DC

## 2014-06-20 NOTE — Progress Notes (Signed)
Subjective:    Patient ID: Mandy Diaz, female    DOB: 05-10-1950, 64 y.o.   MRN: [id number]  HPI This is a pleasant 64 yo female who presents today requesting refill of estradiol. She has been taking for 3 years for hormonal balance. No vaginal bleeding. She feels it work well for her and helps stabilize her mood and decrease symptoms of menopause.   Has noticed increasing SOB with walking. This occurs with heat and going up inclines but not with walking at a regular pace on a flat surface. She also feels SOB with bending over to get under the sink, but not with regular household chores such as dusting and sweping. She admits to not getting any regular exercise. She tried a Zumba class with her daughter, but found it to be too intense. Feels like her balance on uneven surfaces is not as good as it used to be. She can not recall any falls recently or in the last year.  Has occasional chest pain and she takes an aspirin. She is not clear how often this occurs. Sometimes the pain is sharp and lasts for moments, sometimes it is constant and tender to palpation, she thinks this is a symptom of her fibromyalgia. About a year ago, she had an episode of chest pain, nausea, paleness, it resolved spontaneously and she has not had a further episode like that. With her current, intermittent episodes she denies diaphoresis, SOB, neck/jaw/arm pain. Was seen at [facility] for chest pain several years ago. She had a negative stress test. She has a family history of CAD as well as her daughter has congenital heart problems.   She finds it difficult to come in for regular follow up because "I never know how I will feel."   Review of Systems +chest pain, +DOE, - PND, no wheeze, no cough, no edema, no fever    Objective:   Physical Exam Physical Exam  Constitutional: Oriented to person, place, and time. Very talkative. She appears well-developed and well-nourished.  HENT:  Head: Normocephalic and atraumatic.    Eyes: Conjunctivae are normal.  Neck: Normal range of motion. Neck supple.  Cardiovascular: Normal rate, regular rhythm and normal heart sounds.   Pulmonary/Chest: Effort normal and breath sounds normal.  Musculoskeletal: Normal range of motion.  Neurological: Alert and oriented to person, place, and time.  Skin: Skin is warm and dry.  Psychiatric: Normal mood and affect. Behavior is normal. Judgment and thought content normal.  Vitals reviewed. BP 128/73 mmHg  Pulse 90  Temp(Src) 98.3 F (36.8 C) (Oral)  Resp 16  Ht 5' 5.25" (1.657 m)  Wt 160 lb 3.2 oz (72.666 kg)  BMI 26.47 kg/m2  SpO2 100% Walking and conversing pulse ox 93%    Assessment & Plan:  1. Hormone replacement therapy - estradiol (ESTRACE) 1 MG tablet; Take 1 tablet (1 mg total) by mouth daily.  Dispense: 90 tablet; Refill: 1  2. Essential hypertension - blood pressure well controlled on current med - atenolol (TENORMIN) 25 MG tablet; Take 1 tablet (25 mg total) by mouth daily.  Dispense: 90 tablet; Refill: 1  3. Fibromyalgia - discussed benefits of regular exercise and encouraged patient to work on increasing her endurance and stamina with regular walking and low impact aerobic exercise. Suggested Tai Chi. - it sounds like she is rather deconditioned and we discussed importance of gradually increasing her activity for mood and improved endurance. - Follow up in 3 months.    Clarene Reamer,  FNP-BC  [facility]  06/23/2014 12:29 AM

## 2014-06-20 NOTE — Patient Instructions (Addendum)
Follow up in 3 months  Try to walk every day Try Tai Chi for balance Fibromyalgia Fibromyalgia is a disorder that is often misunderstood. It is associated with muscular pains and tenderness that comes and goes. It is often associated with fatigue and sleep disturbances. Though it tends to be Steib-lasting, fibromyalgia is not life-threatening. CAUSES  The exact cause of fibromyalgia is unknown. People with certain gene types are predisposed to developing fibromyalgia and other conditions. Certain factors can play a role as triggers, such as:  Spine disorders.  Arthritis.  Severe injury (trauma) and other physical stressors.  Emotional stressors. SYMPTOMS   The main symptom is pain and stiffness in the muscles and joints, which can vary over time.  Sleep and fatigue problems. Other related symptoms may include:  Bowel and bladder problems.  Headaches.  Visual problems.  Problems with odors and noises.  Depression or mood changes.  Painful periods (dysmenorrhea).  Dryness of the skin or eyes. DIAGNOSIS  There are no specific tests for diagnosing fibromyalgia. Patients can be diagnosed accurately from the specific symptoms they have. The diagnosis is made by determining that nothing else is causing the problems. TREATMENT  There is no cure. Management includes medicines and an active, healthy lifestyle. The goal is to enhance physical fitness, decrease pain, and improve sleep. HOME CARE INSTRUCTIONS   Only take over-the-counter or prescription medicines as directed by your caregiver. Sleeping pills, tranquilizers, and pain medicines may make your problems worse.  Low-impact aerobic exercise is very important and advised for treatment. At first, it may seem to make pain worse. Gradually increasing your tolerance will overcome this feeling.  Learning relaxation techniques and how to control stress will help you. Biofeedback, visual imagery, hypnosis, muscle relaxation, yoga,  and meditation are all options.  Anti-inflammatory medicines and physical therapy may provide short-term help.  Acupuncture or massage treatments may help.  Take muscle relaxant medicines as suggested by your caregiver.  Avoid stressful situations.  Plan a healthy lifestyle. This includes your diet, sleep, rest, exercise, and friends.  Find and practice a hobby you enjoy.  Join a fibromyalgia support group for interaction, ideas, and sharing advice. This may be helpful. SEEK MEDICAL CARE IF:  You are not having good results or improvement from your treatment. FOR MORE INFORMATION  National Fibromyalgia Association: www.fmaware.[place]: www.arthritis.org Document Released: 01/05/2005 Document Revised: 03/30/2011 Document Reviewed: 04/17/2009 [facility] Patient Information 2015 [place], [place]. This information is not intended to replace advice given to you by your health care provider. Make sure you discuss any questions you have with your health care provider.

## 2014-06-20 NOTE — Telephone Encounter (Signed)
Looks like she was in this am seeing Mandy SprangDebbie Diaz for this issue.

## 2014-06-27 ENCOUNTER — Encounter: Payer: Self-pay | Admitting: *Deleted

## 2014-08-07 ENCOUNTER — Other Ambulatory Visit: Payer: Self-pay | Admitting: Family Medicine

## 2014-08-08 ENCOUNTER — Telehealth: Payer: Self-pay

## 2014-08-08 NOTE — Telephone Encounter (Signed)
PA started on covermymeds for esomeprazole. Pt has tried/failed omeprazole and pantoprazole in the past. Waiting on questions from ins.

## 2014-08-09 NOTE — Telephone Encounter (Signed)
Please fax in or call in refill of Tramadol.

## 2014-08-10 NOTE — Telephone Encounter (Signed)
Faxed

## 2014-08-13 NOTE — Telephone Encounter (Signed)
Never received electronic questions or fax. Called ins and completed on the phone and PA approved through 08/13/15. Notified pharm.

## 2014-08-18 ENCOUNTER — Other Ambulatory Visit: Payer: Self-pay | Admitting: Family Medicine

## 2014-08-29 ENCOUNTER — Other Ambulatory Visit: Payer: Self-pay | Admitting: Family Medicine

## 2014-09-01 NOTE — Telephone Encounter (Signed)
Call patient --- due for follow-up with me.  Please schedule her for next available appointment with me.  Also schedule her for appointments every three months with me after you schedule the initial follow-up with me.  Have one three month follow-up be a CPE.

## 2014-09-03 NOTE — Telephone Encounter (Signed)
appt made

## 2014-09-16 ENCOUNTER — Other Ambulatory Visit: Payer: Self-pay | Admitting: Family Medicine

## 2014-10-19 ENCOUNTER — Ambulatory Visit (INDEPENDENT_AMBULATORY_CARE_PROVIDER_SITE_OTHER): Payer: Federal, State, Local not specified - PPO | Admitting: Family Medicine

## 2014-10-19 ENCOUNTER — Encounter: Payer: Self-pay | Admitting: Family Medicine

## 2014-10-19 VITALS — BP 148/82 | HR 92 | Temp 98.9°F | Resp 16 | Ht 65.5 in | Wt 150.0 lb

## 2014-10-19 DIAGNOSIS — R000 Tachycardia, unspecified: Secondary | ICD-10-CM

## 2014-10-19 DIAGNOSIS — K580 Irritable bowel syndrome with diarrhea: Secondary | ICD-10-CM

## 2014-10-19 DIAGNOSIS — R2681 Unsteadiness on feet: Secondary | ICD-10-CM | POA: Diagnosis not present

## 2014-10-19 DIAGNOSIS — M19041 Primary osteoarthritis, right hand: Secondary | ICD-10-CM | POA: Diagnosis not present

## 2014-10-19 DIAGNOSIS — M19042 Primary osteoarthritis, left hand: Secondary | ICD-10-CM

## 2014-10-19 DIAGNOSIS — K219 Gastro-esophageal reflux disease without esophagitis: Secondary | ICD-10-CM | POA: Diagnosis not present

## 2014-10-19 DIAGNOSIS — R251 Tremor, unspecified: Secondary | ICD-10-CM | POA: Diagnosis not present

## 2014-10-19 DIAGNOSIS — Z79899 Other long term (current) drug therapy: Secondary | ICD-10-CM | POA: Diagnosis not present

## 2014-10-19 DIAGNOSIS — F341 Dysthymic disorder: Secondary | ICD-10-CM

## 2014-10-19 DIAGNOSIS — N3941 Urge incontinence: Secondary | ICD-10-CM | POA: Diagnosis not present

## 2014-10-19 DIAGNOSIS — Z23 Encounter for immunization: Secondary | ICD-10-CM | POA: Diagnosis not present

## 2014-10-19 DIAGNOSIS — G43709 Chronic migraine without aura, not intractable, without status migrainosus: Secondary | ICD-10-CM | POA: Diagnosis not present

## 2014-10-19 DIAGNOSIS — M797 Fibromyalgia: Secondary | ICD-10-CM

## 2014-10-19 LAB — POCT URINALYSIS DIP (MANUAL ENTRY)
BILIRUBIN UA: NEGATIVE
Bilirubin, UA: NEGATIVE
GLUCOSE UA: NEGATIVE
LEUKOCYTES UA: NEGATIVE
Nitrite, UA: NEGATIVE
Protein Ur, POC: NEGATIVE
SPEC GRAV UA: 1.01
UROBILINOGEN UA: 0.2
pH, UA: 6

## 2014-10-19 LAB — POC MICROSCOPIC URINALYSIS (UMFC): MUCUS RE: ABSENT

## 2014-10-19 MED ORDER — DULOXETINE HCL 60 MG PO CPEP: 60.0000 mg | ORAL_CAPSULE | Freq: Every day | ORAL | Status: DC

## 2014-10-19 NOTE — Progress Notes (Signed)
Subjective:    Patient ID: Mandy Diaz, female    DOB: [birth year], 64 y.o.   MRN: [id number]  10/19/2014  Medication Refill; Tremors; and stumbling   HPI This 64 y.o. female presents for nine month follow-up and evaluation of the following:   1.  Tremor:  Has been suffering with shaking with eating and using utensils.  Worried about etiology.    2.  Thumb OA B: having pain in B thumbs.  S/p xrays in past that revealed degenerative changes.    3. Fibromyalgia: having B wrist pain and B ankle pain; also B big toe pain.   S/p rheumatology/[facility] in 01/09/2013; confirmed diagnosis of fibromyalgia; recommended Lyrica if medication needed.   Taking Flexeril/cyclobenzaprine twice per week on average.  Not exercising.  Not working now.  Spending a lot of time in bed.  Tried Zumba in past year and could not keep up.    4.  Headaches: now headaches are on L side of head; previously always suffered with R sided headaches.  Will get sharp needle pain in L eye and into L occipital region.   Fioricet 2-3 times per week. Midrin for milder headaches; uses less frequently than Fioricet. Uses Imitrex for migraine like headaches.   Also has Phenergan and Zofran for nausea; uses PRN.   Has been on this variety of medications for chronic headaches for years.  Uses each medication for different types and different severity of headaches.    5. Staggering: has been staggering a lot lately; no falls; cannot tell that about to fall towards a certain area.  Has almost fallen down the steps.  With steps, will stagger with steps.  Unpredictable.  No history of vertigo.  Not dizzy.  Was getting into car and gets unsteady getting into the car.  Using a cart in grocery store is difficult.   (Daughter has reported to Zorawar Strollo in confidence that patient over uses variety of medications; daughter is convinced that patient's fatigue and staggering is due to overmedication use).    6.  Palpitations: taking Atenolol   one daily; having more palpitations recently.  Denies CP or SOB or leg swelling. Denies associated dizziness.  7.  Anxiety: taking Wellbutrin  daily; taking Cymbalta  two daily. Taking Clonazepam 0.5mg  bid PRN; taking sparingly; taking none this week.  Having some good days and then having some really bad days.  Two weeks ago pt was really in a funk; husband had neck surgery; son moved out.  Does not get together with daughter very much.  Not making memories with children any more which is really depressing to pt.  Now not vacationing with daughter any longer.  Now that grandchildren are older, do not need pt to go on vacation as much.  Not exercising due to poor balance.  Denies SI. Not working; struggled with fibromyalgia pain while working; very difficult to work regularly due to myalgias and arthralgias.  8. IBS: taking Librax twice weekly on average.  9. GERD: taking Nexium  daily. Denies n/v/d/c; denies persistent abdominal pain.  10. Urge incontinence: having some leakage during the day when trying to get to bathroom; denies dysuria, hematuria,enuresis.  Review of Systems  Constitutional: Negative for fever, chills, diaphoresis and fatigue.  Eyes: Negative for visual disturbance.  Respiratory: Negative for cough and shortness of breath.   Cardiovascular: Positive for palpitations. Negative for chest pain and leg swelling.  Gastrointestinal: Negative for nausea, vomiting, abdominal pain, diarrhea, constipation, blood in stool, abdominal distention,  anal bleeding and rectal pain.  Endocrine: Negative for cold intolerance, heat intolerance, polydipsia, polyphagia and polyuria.  Genitourinary: Positive for urgency. Negative for dysuria, frequency, hematuria, flank pain, enuresis and pelvic pain.  Musculoskeletal: Positive for myalgias, arthralgias and gait problem.  Neurological: Positive for headaches. Negative for dizziness, tremors, seizures, syncope, facial asymmetry, speech  difficulty, weakness, light-headedness and numbness.  Psychiatric/Behavioral: Positive for dysphoric mood. Negative for suicidal ideas, sleep disturbance and self-injury. The patient is nervous/anxious.     Past Medical History  Diagnosis Date  . Menopause   . GERD (gastroesophageal reflux disease)   . Urinary incontinence   . Migraine   . Depression   . Anxiety   . Hypertension   . Thyroid disease   . Fibromyalgia 12/2012    s/p rheumatology consultation to confirm diagnosis; no evidence of autoimmune process.  . IBS (irritable bowel syndrome)    Past Surgical History  Procedure Laterality Date  . Cholecystectomy     Allergies  Allergen Reactions  . Amoxicillin     REACTION: Rash  . Metoclopramide Hcl     REACTION: Affected nerves  . Penicillins     REACTION: Rash  . Reglan [Metoclopramide]   . Sulfamethoxazole     REACTION: Sores in mouth    Social History   Social History  . Marital Status: Married    Spouse Name: N/A  . Number of Children: N/A  . Years of Education: N/A   Occupational History  . Not on file.   Social History Main Topics  . Smoking status: Never Smoker   . Smokeless tobacco: Never Used  . Alcohol Use: No  . Drug Use: No  . Sexual Activity: Yes    Birth Control/ Protection: None   Other Topics Concern  . Not on file   Social History Narrative   Regular exercise - NO         Family History  Problem Relation Age of Onset  . Arthritis    . Diabetes      1st degree relative  . Hyperlipidemia    . Stroke      Female 1st degree relative <50  . Heart disease    . Cancer Mother   . Cancer Father   . Heart disease Father   . Diabetes Brother   . Heart disease Brother   . Heart disease Daughter        Objective:    BP 148/82 mmHg  Pulse 92  Temp(Src) 98.9 F (37.2 C)  Resp 16  Ht 5' 5.5" (1.664 m)  Wt 150 lb (68.04 kg)  BMI 24.57 kg/m2 Physical Exam  Constitutional: She is oriented to person, place, and time. She  appears well-developed and well-nourished. No distress.  HENT:  Head: Normocephalic and atraumatic.  Right Ear: External ear normal.  Left Ear: External ear normal.  Nose: Nose normal.  Mouth/Throat: Oropharynx is clear and moist.  Eyes: Conjunctivae and EOM are normal. Pupils are equal, round, and reactive to light.  Neck: Normal range of motion. Neck supple. Carotid bruit is not present. No thyromegaly present.  Cardiovascular: Normal rate, regular rhythm, normal heart sounds and intact distal pulses.  Exam reveals no gallop and no friction rub.   No murmur heard. Pulmonary/Chest: Effort normal and breath sounds normal. She has no wheezes. She has no rales.  Abdominal: Soft. Bowel sounds are normal. She exhibits no distension and no mass. There is no tenderness. There is no rebound and no guarding.  Lymphadenopathy:  She has no cervical adenopathy.  Neurological: She is alert and oriented to person, place, and time. No cranial nerve deficit. She exhibits normal muscle tone. She displays a negative Romberg sign. Coordination and gait normal.  Skin: Skin is warm and dry. No rash noted. She is not diaphoretic. No erythema. No pallor.  Psychiatric: She has a normal mood and affect. Her behavior is normal. Judgment and thought content normal.   INFLUENZA VACCINE ADMINISTERED.     Assessment & Plan:   1. Unsteady gait   2. Tremor   3. Chronic migraine without aura without status migrainosus, not intractable   4. Urge incontinence   5. Tachycardia   6. Irritable bowel syndrome with diarrhea   7. Gastroesophageal reflux disease without esophagitis   8. Fibromyalgia   9. Primary osteoarthritis of both hands   10. ANXIETY DEPRESSION   11. Need for prophylactic vaccination and inoculation against influenza   12. Polypharmacy     1. Unsteady gait: New.  Feel secondary to deconditioning and polypharmacy of high risk medications; however, will refer to neurology to rule out neurological  process.  Obtain labs and simplify medications. 2.  Tremor B hands: New.  Refer to neurology to rule out primary neurological process. Obtain labs. Concern for polypharmacy and even serotonin syndrome contributing to several of symptoms. 3.  Chronic migraines: stable; Morr discussion with patient regarding chronic polypharmacy and large variety of migraine medications.  Pt very, very reluctant to simplify headache regimen. Patient agreeable to choose Phenergan instead of Zofran for nausea associated with migraines. As patient ages,will need to switch Phenergan to Zofran; however, will agree with continue Phenergan for now.   4.  Urge incontinence: New.  Send urine culture; pt declined urology referral for urodynamic studies; prefers trial of medication first.  (Urine culture negative; prescribed Vesicare  daily). 5.  Tachycardia/palpitations: chronic; increased frequency lately; continue Atenolol  one 1-2 daily. 6.  IBS: stable; using Librax twice weekly on average by report. 7.  GERD: controlled with Nexium at this time. 8.  Fibromyalgia: stable; pt non-compliant with exercise; reluctant to try Lyrica at this time.  Encourage regular exercise and good sleep hygiene. 9.  Anxiety and depression: moderately stable at this time; expressed concern regarding current doses of Cymbalta and Wellbutrin; pt desires to decrease Cymbalta to one tablet daily; continue Wellbutrin at  daily at this time yet will need to decrease to  daily at next visit.  Using Klonopin sparingly at this time and will plan to discontinue in future appointments. 10.  Polypharmacy: definitely contributing to current symptoms; plan to decrease dose of antidepressants; plan to discontinue 2-3 migraine medications in upcoming six months.   Orders Placed This Encounter  Procedures  . Urine culture  . Flu Vaccine QUAD 36+ mos IM  . CBC with Differential/Platelet  . Comprehensive metabolic panel  . TSH  . Vitamin B12  .  Vit D  25 hydroxy (rtn osteoporosis monitoring)  . Ambulatory referral to Neurology    Referral Priority:  Routine    Referral Type:  Consultation    Referral Reason:  Specialty Services Required    Requested Specialty:  Neurology    Number of Visits Requested:  1  . POCT urinalysis dipstick  . POCT Microscopic Urinalysis ([facility])   Meds ordered this encounter  Medications  . DISCONTD: DULoxetine (CYMBALTA) 60 MG capsule    Sig: Take 1 capsule (60 mg total) by mouth daily.    Dispense:  90 capsule  Refill:  1  . solifenacin (VESICARE) 5 MG tablet    Sig: Take 1 tablet (5 mg total) by mouth daily.    Dispense:  30 tablet    Refill:  5    Return in about 3 months (around 01/18/2015) for complete physical examiniation.    Kristi Paulita Fujita, M.D. [facility] [address] [place], [place]  [address] [phone] phone [phone] fax

## 2014-10-20 LAB — CBC WITH DIFFERENTIAL/PLATELET
BASOS ABS: 0 10*3/uL (ref 0.0–0.1)
Basophils Relative: 0 % (ref 0–1)
Eosinophils Absolute: 0 10*3/uL (ref 0.0–0.7)
Eosinophils Relative: 0 % (ref 0–5)
HEMATOCRIT: 39.9 % (ref 36.0–46.0)
HEMOGLOBIN: 13.6 g/dL (ref 12.0–15.0)
LYMPHS ABS: 1.6 10*3/uL (ref 0.7–4.0)
LYMPHS PCT: 23 % (ref 12–46)
MCH: 29.4 pg (ref 26.0–34.0)
MCHC: 34.1 g/dL (ref 30.0–36.0)
MCV: 86.4 fL (ref 78.0–100.0)
MONOS PCT: 5 % (ref 3–12)
MPV: 9.8 fL (ref 8.6–12.4)
Monocytes Absolute: 0.3 10*3/uL (ref 0.1–1.0)
NEUTROS PCT: 72 % (ref 43–77)
Neutro Abs: 5 10*3/uL (ref 1.7–7.7)
PLATELETS: 337 10*3/uL (ref 150–400)
RBC: 4.62 MIL/uL (ref 3.87–5.11)
RDW: 13.1 % (ref 11.5–15.5)
WBC: 6.9 10*3/uL (ref 4.0–10.5)

## 2014-10-20 LAB — COMPREHENSIVE METABOLIC PANEL
ALBUMIN: 4.3 g/dL (ref 3.6–5.1)
ALK PHOS: 53 U/L (ref 33–130)
ALT: 23 U/L (ref 6–29)
AST: 26 U/L (ref 10–35)
BUN: 10 mg/dL (ref 7–25)
CALCIUM: 9.2 mg/dL (ref 8.6–10.4)
CO2: 25 mmol/L (ref 20–31)
Chloride: 101 mmol/L (ref 98–110)
Creat: 0.89 mg/dL (ref 0.50–0.99)
Glucose, Bld: 94 mg/dL (ref 65–99)
POTASSIUM: 4 mmol/L (ref 3.5–5.3)
Sodium: 137 mmol/L (ref 135–146)
TOTAL PROTEIN: 6.8 g/dL (ref 6.1–8.1)
Total Bilirubin: 0.4 mg/dL (ref 0.2–1.2)

## 2014-10-20 LAB — TSH: TSH: 0.74 u[IU]/mL (ref 0.350–4.500)

## 2014-10-20 LAB — VITAMIN B12: Vitamin B-12: 1015 pg/mL — ABNORMAL HIGH (ref 211–911)

## 2014-10-21 LAB — URINE CULTURE: Colony Count: 15000

## 2014-10-22 LAB — VITAMIN D 25 HYDROXY (VIT D DEFICIENCY, FRACTURES): VIT D 25 HYDROXY: 59 ng/mL (ref 30–100)

## 2014-10-23 MED ORDER — SOLIFENACIN SUCCINATE 5 MG PO TABS: 5.0000 mg | ORAL_TABLET | Freq: Every day | ORAL | Status: DC

## 2014-10-24 ENCOUNTER — Telehealth: Payer: Self-pay

## 2014-10-24 NOTE — Telephone Encounter (Signed)
Spoke with pt, her lab results were normal and we discussed the numbers. She would like Dr. Katrinka Blazing to know:  1.She is only taking the Nexium 2. She would like the medication to treat pimples on face Tretinon 0.5 %(she wants a stronger dose, previously 0.025% 3.Thyroid medication she only takes one tablet in the am 4. She would like to know which Probiotic to use for her stomach but the Align made her gasey 5. She does not want to decrease the Cymbalta because she read that it helps with Fibromyalgia so she chose to decrease the Wellbutrin instead. 6. Do you want pt to get the shingles vaccine, if so can you send to the pharmacy.  Cell: [phone]

## 2014-10-24 NOTE — Telephone Encounter (Signed)
Pt called about labs. Was unable to see them on MyChart. Notified.

## 2014-10-24 NOTE — Telephone Encounter (Signed)
Pt states she had seen Dr Katrinka Blazing and want to discuss about some changes in her medication. Please call [phone]

## 2014-10-25 ENCOUNTER — Telehealth: Payer: Self-pay

## 2014-10-25 DIAGNOSIS — L719 Rosacea, unspecified: Secondary | ICD-10-CM

## 2014-10-25 MED ORDER — BUPROPION HCL ER (XL) 150 MG PO TB24: 150.0000 mg | ORAL_TABLET | Freq: Every day | ORAL | Status: DC

## 2014-10-25 MED ORDER — DULOXETINE HCL 60 MG PO CPEP: 120.0000 mg | ORAL_CAPSULE | Freq: Every day | ORAL | Status: DC

## 2014-10-25 MED ORDER — ZOSTER VACCINE LIVE 19400 UNT/0.65ML ~~LOC~~ SOLR: 0.6500 mL | Freq: Once | SUBCUTANEOUS | Status: DC

## 2014-10-25 MED ORDER — TRETINOIN 0.05 % EX GEL: 1.0000 "application " | Freq: Every day | CUTANEOUS | Status: DC

## 2014-10-25 MED ORDER — TRETINOIN 0.05 % EX CREA: TOPICAL_CREAM | Freq: Every day | CUTANEOUS | Status: DC

## 2014-10-25 NOTE — Telephone Encounter (Signed)
PA was completed for tretinoin cream on the phone with [facility] of rosacea and acne and it was denied. I was advised that if we can be more specific on acne type, I can call back tomorrow and it may be approved. Dr Katrinka Blazing, please advise and I will put in pt's problem list as well so it won't be a problem next time. I believe tret cream is usually covered for acne vulgaris. Is this the type pt has or another?

## 2014-10-25 NOTE — Telephone Encounter (Signed)
Pt states we just called in a medication for her and it needs prior autho and also a medication that was a gel and it also comes in a cream and she would like to have it in a cream not the gel

## 2014-10-25 NOTE — Telephone Encounter (Signed)
Call patient back --- 1.  I sent in Tretinoin 0.05%.  2.  Recommend [employer] as a probiotic choice.  3.  I have sent in Wellbutrin XL  daily to replace  dose.  I have sent in new prescription for Cymbalta to continue two tablets daily of  capsule.  4. I sent shingles vaccine rx to pharmacy.

## 2014-10-26 DIAGNOSIS — L719 Rosacea, unspecified: Secondary | ICD-10-CM | POA: Insufficient documentation

## 2014-10-26 NOTE — Telephone Encounter (Signed)
Thank you. I was told yesterday when completed PA that this med will not be approved for rosacea or assoc acne. Do you want to Rx anything else, or have pt pay OOP?

## 2014-10-26 NOTE — Telephone Encounter (Signed)
Her acne is related to her rosacea, so it is really acne rosacea.  Placed on problem list.

## 2014-10-26 NOTE — Telephone Encounter (Signed)
Called and spoke with pt and she is aware of Dr. Michaelle Copas recs as below.  Pt is aware of meds and rx for the shingles vaccine sent to her pharmacy.  Nothing further is needed.

## 2014-10-29 MED ORDER — METRONIDAZOLE 1 % EX GEL: Freq: Every day | CUTANEOUS | Status: DC

## 2014-10-29 NOTE — Telephone Encounter (Signed)
Please call and advise pt that insurance denied coverage of Retin-A. I have sent in Metrogel which insurance may pay for.

## 2014-10-29 NOTE — Addendum Note (Signed)
Addended by: Ethelda Chick on: 10/29/2014 06:32 PM   Modules accepted: Orders

## 2014-10-30 ENCOUNTER — Telehealth: Payer: Self-pay

## 2014-10-30 NOTE — Telephone Encounter (Signed)
Called and advised pt of new Rx, and she had already p/up to try. She will let us know if not effective so that PA can be tried again or other alternative sent.  Dr Katrinka Blazing, pt also wanted your opinion about what neurologist you would recommend. She got an appt w/Dr Anne Hahn at [facility], but her husband had seen him in the past and was not happy with him. Pt would rather wait longer to see a neurologist that she would hopefully like better. She has heard of Dr Frances Furbish at [facility] but didn't know if you know anything about her or have another you recommend at either there or [place]?

## 2014-10-30 NOTE — Telephone Encounter (Signed)
Pt is needing to talk with dr Katrinka Blazing about her meds that was denied by insurance and wants to talk about a referral to a neurologist

## 2014-10-31 NOTE — Telephone Encounter (Signed)
Called and spoke with pt. Informed her that Dr. Katrinka BlazingSmith states that she has several pt's who see Dr. Peggye LeyAther and like her; however; she does not know much about [facility] quite yet. Pt understood and seemed pleased. She did not have any further questions.

## 2014-10-31 NOTE — Telephone Encounter (Signed)
I do have several patients who see Dr. Frances FurbishAthar and like her.  I do not know much about [place] [facility] quite yet.

## 2014-11-01 NOTE — Telephone Encounter (Signed)
Please clarify --- I assume that patient will contact [facility] and request an appointment with preferred neurologist?

## 2014-11-02 NOTE — Telephone Encounter (Signed)
Spoke with pt, she is going to call the neurologist to make the appt. She also states she thinks she never had the chicken pox and wants to know if she should get the Shingles vaccine. She may have gotten the virus in the past but never had the spots, so she is unsure. Please advise.

## 2014-11-07 ENCOUNTER — Ambulatory Visit: Payer: Federal, State, Local not specified - PPO | Admitting: Neurology

## 2014-11-12 ENCOUNTER — Telehealth: Payer: Self-pay | Admitting: Family Medicine

## 2014-11-12 ENCOUNTER — Encounter: Payer: Self-pay | Admitting: Family Medicine

## 2014-11-12 NOTE — Telephone Encounter (Signed)
Spoke with patient about changing her appt from December to Jan

## 2014-11-13 NOTE — Progress Notes (Signed)
Patient has rescheduled appointment to 01/25/15.

## 2014-11-21 ENCOUNTER — Other Ambulatory Visit: Payer: Self-pay | Admitting: Family Medicine

## 2014-11-21 ENCOUNTER — Telehealth: Payer: Self-pay

## 2014-11-21 NOTE — Telephone Encounter (Signed)
Pt says she is having a lot of trouble getting her traMADol (ULTRAM) 50 MG tablet [[id number]][[id number]]. Her Surescripts is here. CB # [phone]

## 2014-11-22 NOTE — Telephone Encounter (Signed)
Please call in refill of Tramadol as approved. 

## 2014-11-22 NOTE — Telephone Encounter (Signed)
Refill approved and sent to Rx refill pool to be called in.

## 2014-11-23 NOTE — Telephone Encounter (Signed)
Called in and pt advised by operator.

## 2015-01-04 ENCOUNTER — Other Ambulatory Visit: Payer: Self-pay | Admitting: Family Medicine

## 2015-01-18 ENCOUNTER — Ambulatory Visit: Payer: Federal, State, Local not specified - PPO | Admitting: Family Medicine

## 2015-01-25 ENCOUNTER — Ambulatory Visit (INDEPENDENT_AMBULATORY_CARE_PROVIDER_SITE_OTHER): Payer: Federal, State, Local not specified - PPO | Admitting: Family Medicine

## 2015-01-25 ENCOUNTER — Encounter: Payer: Self-pay | Admitting: Family Medicine

## 2015-01-25 VITALS — BP 129/82 | HR 94 | Temp 98.1°F | Resp 16 | Ht 65.5 in | Wt 166.0 lb

## 2015-01-25 DIAGNOSIS — E7800 Pure hypercholesterolemia, unspecified: Secondary | ICD-10-CM

## 2015-01-25 DIAGNOSIS — Z114 Encounter for screening for human immunodeficiency virus [HIV]: Secondary | ICD-10-CM

## 2015-01-25 DIAGNOSIS — G43709 Chronic migraine without aura, not intractable, without status migrainosus: Secondary | ICD-10-CM | POA: Diagnosis not present

## 2015-01-25 DIAGNOSIS — K580 Irritable bowel syndrome with diarrhea: Secondary | ICD-10-CM | POA: Diagnosis not present

## 2015-01-25 DIAGNOSIS — M546 Pain in thoracic spine: Secondary | ICD-10-CM | POA: Diagnosis not present

## 2015-01-25 DIAGNOSIS — I10 Essential (primary) hypertension: Secondary | ICD-10-CM | POA: Diagnosis not present

## 2015-01-25 DIAGNOSIS — F341 Dysthymic disorder: Secondary | ICD-10-CM | POA: Diagnosis not present

## 2015-01-25 DIAGNOSIS — M797 Fibromyalgia: Secondary | ICD-10-CM

## 2015-01-25 DIAGNOSIS — Z1159 Encounter for screening for other viral diseases: Secondary | ICD-10-CM | POA: Diagnosis not present

## 2015-01-25 DIAGNOSIS — R000 Tachycardia, unspecified: Secondary | ICD-10-CM

## 2015-01-25 LAB — LIPID PANEL
CHOLESTEROL: 255 mg/dL — AB (ref 125–200)
HDL: 55 mg/dL (ref >=46)
LDL Cholesterol: 157 mg/dL — ABNORMAL HIGH (ref <130)
Total CHOL/HDL Ratio: 4.6 Ratio (ref <=5.0)
Triglycerides: 216 mg/dL — ABNORMAL HIGH (ref <150)
VLDL: 43 mg/dL — ABNORMAL HIGH (ref <30)

## 2015-01-25 MED ORDER — ATENOLOL 25 MG PO TABS: 25.0000 mg | ORAL_TABLET | Freq: Every day | ORAL | Status: DC

## 2015-01-25 MED ORDER — CLONAZEPAM 0.5 MG PO TABS: ORAL_TABLET | ORAL | Status: DC

## 2015-01-25 MED ORDER — PANTOPRAZOLE SODIUM 40 MG PO TBEC: 40.0000 mg | DELAYED_RELEASE_TABLET | Freq: Every day | ORAL | Status: DC

## 2015-01-25 MED ORDER — BUPROPION HCL ER (XL) 300 MG PO TB24: 300.0000 mg | ORAL_TABLET | Freq: Every day | ORAL | Status: DC

## 2015-01-25 MED ORDER — CYCLOBENZAPRINE HCL 5 MG PO TABS: 5.0000 mg | ORAL_TABLET | Freq: Three times a day (TID) | ORAL | Status: DC | PRN

## 2015-01-25 MED ORDER — BUTALBITAL-APAP-CAFFEINE 50-325-40 MG PO TABS: ORAL_TABLET | ORAL | Status: DC

## 2015-01-25 MED ORDER — PROMETHAZINE HCL 12.5 MG PO TABS: ORAL_TABLET | ORAL | Status: DC

## 2015-01-25 MED ORDER — DULOXETINE HCL 60 MG PO CPEP
60.0000 mg | ORAL_CAPSULE | Freq: Every day | ORAL | Status: DC
Start: 2015-01-25 — End: 2015-10-22

## 2015-01-25 MED ORDER — TRAMADOL HCL 50 MG PO TABS: 50.0000 mg | ORAL_TABLET | Freq: Four times a day (QID) | ORAL | Status: DC | PRN

## 2015-01-25 MED ORDER — TRAZODONE HCL 50 MG PO TABS: ORAL_TABLET | ORAL | Status: DC

## 2015-01-25 MED ORDER — VALACYCLOVIR HCL 500 MG PO TABS: 500.0000 mg | ORAL_TABLET | Freq: Three times a day (TID) | ORAL | Status: DC

## 2015-01-25 MED ORDER — CILIDINIUM-CHLORDIAZEPOXIDE 2.5-5 MG PO CAPS: 1.0000 | ORAL_CAPSULE | Freq: Three times a day (TID) | ORAL | Status: DC
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
## 2015-01-26 LAB — HIV ANTIBODY (ROUTINE TESTING W REFLEX): HIV: NONREACTIVE

## 2015-01-26 LAB — HEPATITIS C ANTIBODY: HCV AB: NEGATIVE

## 2015-02-06 ENCOUNTER — Ambulatory Visit (INDEPENDENT_AMBULATORY_CARE_PROVIDER_SITE_OTHER): Payer: Federal, State, Local not specified - PPO | Admitting: Neurology

## 2015-02-06 ENCOUNTER — Encounter: Payer: Self-pay | Admitting: Neurology

## 2015-02-06 VITALS — BP 127/70 | HR 97 | Ht 65.0 in | Wt 163.6 lb

## 2015-02-06 DIAGNOSIS — R2689 Other abnormalities of gait and mobility: Secondary | ICD-10-CM

## 2015-02-06 DIAGNOSIS — R270 Ataxia, unspecified: Secondary | ICD-10-CM

## 2015-02-06 DIAGNOSIS — R4189 Other symptoms and signs involving cognitive functions and awareness: Secondary | ICD-10-CM

## 2015-02-06 DIAGNOSIS — R4789 Other speech disturbances: Secondary | ICD-10-CM | POA: Diagnosis not present

## 2015-02-06 NOTE — Progress Notes (Addendum)
[facility]    Provider:  Dr [name] Referring Provider: Ethelda Chick, MD Primary Care Physician:  Nilda Simmer, MD  CC:  Gait abnormality  HPI:  Mandy Diaz is a 65 y.o. female here as a referral from Dr. Katrinka Blazing for staggering gait. Past medical HTN, hld, migraine,depression,anxiety,fibromyalga. She is here with her husband who provides information. She was diagnosed with Fibromyalgia 2 years ago, she has a lot of chronic pain. She noticed within the last 9 months that she has a tremor and notices it with action. Occasionally she will have tics sometimes but not often. Her walking will all of a sudden be abnormal, she will be up against the right wall like a veering car. She feels like a "ping pong ball" and she bounces on the hallways and then the other side. But she has always been clumsy all her life. When she goes up steps she has a tendency to fall backwards. It is worsening. It can happen anytime. Lasts briefly 5-10 minutes. She denies weakness, dizziness, lightheadedness,chest pain, confusion during the episodes. Not medication elated. Can be anytime. She has a lot of headaches. It may happen with headaches, she has chronic headaches and she reports she has been diagnosed with every type of headache but she can have these episodes without the headaches. But she is not dizzy or vertigionous with the abnormal gait. She has some sensory changes on the lips and in the 2 fingers. She has constant neck pain. She has sciatica and LBP. She has neck pain. Mother had a tremor.   Reviewed notes, labs and imaging from outside physicians, which showed:   Per notes reviewed in epic: has been staggering a lot lately; no falls; cannot tell that about to fall towards a certain area. Has almost fallen down the steps. With steps, will stagger with steps. Unpredictable. No history of vertigo. Not dizzy. Was getting into car and gets unsteady getting into the car. Using a cart in  grocery store is difficult. (Daughter has reported to provider in confidence that patient over uses variety of medications; daughter is convinced that patient's fatigue and staggering is due to overmedication use).   MRi of the brain in 08/2012 was normal, personally reviewed images  Normal/negative labs: HIV, hep c, cbc, cmp, tsh, B12  Review of Systems: Patient complains of symptoms per HPI as well as the following symptoms: weight gain,atigue, palpitations, shortness of breath, snoring, spinning, trouble swallowing,aching muscles,incontinence. Pertinent negatives per HPI. All others negative.   Social History   Social History  . Marital Status: Married    Spouse Name: N/A  . Number of Children: N/A  . Years of Education: N/A   Occupational History  . Not on file.   Social History Main Topics  . Smoking status: Never Smoker   . Smokeless tobacco: Never Used  . Alcohol Use: No  . Drug Use: No  . Sexual Activity: Yes    Birth Control/ Protection: None   Other Topics Concern  . Not on file   Social History Narrative   Regular exercise - NO          Family History  Problem Relation Age of Onset  . Arthritis    . Diabetes      1st degree relative  . Hyperlipidemia    . Stroke      Female 1st degree relative <50  . Heart disease    . Cancer Mother   . Stroke Mother   . Tremor  Mother   . Cancer Father   . Heart disease Father   . Diabetes Brother   . Heart disease Brother   . Neuropathy Brother   . Heart disease Daughter   . Aneurysm Paternal Grandfather     Past Medical History  Diagnosis Date  . Menopause   . GERD (gastroesophageal reflux disease)   . Urinary incontinence   . Migraine   . Depression   . Anxiety   . Hypertension   . Thyroid disease   . Fibromyalgia 12/2012    s/p rheumatology consultation to confirm diagnosis; no evidence of autoimmune process.  . IBS (irritable bowel syndrome)     Past Surgical History  Procedure Laterality Date   . Cholecystectomy      Current Outpatient Prescriptions  Medication Sig Dispense Refill  . atenolol (TENORMIN) 25 MG tablet Take 1 tablet (25 mg total) by mouth daily. 90 tablet 1  . buPROPion (WELLBUTRIN XL) 300 MG 24 hr tablet Take 1 tablet (300 mg total) by mouth daily. 90 tablet 1  . butalbital-acetaminophen-caffeine (FIORICET, ESGIC) 50-325-40 MG tablet TAKE ONE TABLET EVERY 6 HOURS AS NEEDED FOR HEADACHES 30 tablet 0  . Cholecalciferol (VITAMIN D3) 3000 UNITS TABS Take 4,000 Units by mouth 2 (two) times daily.     . clidinium-chlordiazePOXIDE (LIBRAX) 5-2.5 MG capsule Take 1 capsule by mouth 3 (three) times daily before meals. 60 capsule 1  . clonazePAM (KLONOPIN) 0.5 MG tablet TAKE 1 TABLET BY MOUTH 2 TIMES DAILY AS NEEDED FOR ANXIETY 60 tablet 0  . cyanocobalamin (,VITAMIN B-12,) 1000 MCG/ML injection Inject 1 mL (1,000 mcg total) into the muscle every 14 (fourteen) days. (Patient taking differently: Inject 1,000 mcg into the muscle every 7 (seven) days. ) 6 mL 3  . cyclobenzaprine (FLEXERIL) 5 MG tablet Take 1 tablet (5 mg total) by mouth 3 (three) times daily as needed for muscle spasms. 1 morning, mid-afternoon and bedtime for muscle spasm 30 tablet 2  . DULoxetine (CYMBALTA) 60 MG capsule Take 1 capsule (60 mg total) by mouth daily. 90 capsule 1  . estradiol (ESTRACE) 1 MG tablet TAKE 1 TABLET (  TOTAL) BY MOUTH DAILY 90 tablet 0  . Magnesium Malate POWD 1,350 mg by Does not apply route daily. Take 1, 350 times 3    . metroNIDAZOLE (METROGEL) 1 % gel Apply topically daily. 45 g 3  . NATURE-THROID 65 MG tablet Take 65 mg by mouth every morning.  2  . NON FORMULARY Take 150 mg by mouth every morning. PREGNENOLONE (for memory and hormonal balance).    . NON FORMULARY Take 165 mg by mouth daily. ALL ADRENAL RAW.    . pantoprazole (PROTONIX) 40 MG tablet Take 1 tablet (40 mg total) by mouth daily. 90 tablet 3  . Probiotic Product (PROBIOTIC DAILY PO) Take 1 capsule by mouth daily.      . progesterone (PROMETRIUM) 100 MG capsule Take 100 mg by mouth At bedtime.    . promethazine (PHENERGAN) 12.5 MG tablet TAKE 1 TABLET (12.5 MG TOTAL) BY MOUTH EVERY 8 (EIGHT) HOURS AS NEEDED FOR NAUSEA OR VOMITING. 30 tablet 0  . SUMAtriptan (IMITREX) 100 MG tablet TAKE 1 TABLET BY MOUTH AT ONSET OF HEADACHE, MAY REPEAT ONCE IF NEEDED 9 tablet 5  . Thyroid 48.75 MG TABS Take 1 tablet by mouth 2 (two) times daily.     . traMADol (ULTRAM) 50 MG tablet Take 1 tablet (50 mg total) by mouth every 6 (six) hours as needed. 45  tablet 0  . traZODone (DESYREL) 50 MG tablet Take 1-2 tablets by mouth at bedtime as needed for sleep. 180 tablet 1  . tretinoin (RETIN-A) 0.05 % cream Apply topically at bedtime. 45 g 3  . valACYclovir (VALTREX) 500 MG tablet Take 1 tablet (500 mg total) by mouth 3 (three) times daily. For viral infection 90 tablet 1  . zoster vaccine live, PF, (ZOSTAVAX) [address] UNT/0.65ML injection Inject 19,400 Units into the skin once. 0.65 mL 0  . [DISCONTINUED] omeprazole (PRILOSEC) 40 MG capsule Take 1 capsule (40 mg total) by mouth 2 (two) times daily as needed. For severe GERD 60 capsule 11   No current facility-administered medications for this visit.    Allergies as of 02/06/2015 - Review Complete 02/06/2015  Allergen Reaction Noted  . Amoxicillin  04/21/2006  . Metoclopramide hcl  09/09/2006  . Penicillins  09/09/2006  . Reglan [metoclopramide]  10/19/2014  . Sulfamethoxazole  04/21/2006    Vitals: BP 127/70 mmHg  Pulse 97  Ht 5\' 5"  (1.651 m)  Wt 163 lb 9.6 oz (74.208 kg)  BMI 27.22 kg/m2 Last Weight:  Wt Readings from Last 1 Encounters:  02/06/15 163 lb 9.6 oz (74.208 kg)   Last Height:   Ht Readings from Last 1 Encounters:  02/06/15 5\' 5"  (1.651 m)   Physical exam: Exam: Gen: NAD, conversant, well nourised, obese, well groomed                     CV: RRR, no MRG. No Carotid Bruits. No peripheral edema, warm, nontender Eyes: Conjunctivae clear without exudates or  hemorrhage  Neuro: Detailed Neurologic Exam  Speech:    Speech is normal; fluent and spontaneous with normal comprehension.  Cognition:    The patient is oriented to person, place, and time;     recent and remote memory intact;     language fluent;     normal attention, concentration,     fund of knowledge Cranial Nerves:    The pupils are equal, round, and reactive to light. The fundi are normal and spontaneous venous pulsations are present. Visual fields are full to finger confrontation. Extraocular movements are intact. Trigeminal sensation is intact and the muscles of mastication are normal. The face is symmetric. The palate elevates in the midline. Hearing intact. Voice is normal. Shoulder shrug is normal. The tongue has normal motion without fasciculations.   Coordination:    Normal finger to nose and heel to shin. Normal rapid alternating movements.   Gait:    Cautious, no ataxia  Motor Observation:    No asymmetry, no atrophy, mild postural high freq low amplitude tremor. Tone:    Normal muscle tone.    Posture:    Posture is normal. normal erect    Strength:    Strength is V/V in the upper and lower limbs.      Sensation: Mild decr sensory at the toes pp and temp, normal vibration and proprioception     Reflex Exam:  DTR's:    Deep tendon reflexes in the upper and lower extremities are brisk bilaterally.   Toes:    The toes are equivocal bilaterally.   Clonus:    Clonus is absent.    Assessment/Plan:  65 year old with reported episodic gait abnormality with no other associated symptoms. Exam is unremarkable except for some decreased pin prick and temp distally in the feet. I did not notice any gait abnormality on exam. MRI brain 2014 normal. Notes in  chart indicate she may be staggering due to reported overmedication use by a family member. Will order an MRI of the cervical spine and brain. If negative, feel that physical therapy for gait and balance will be  appropriate. Tremor may be essential tremor, mother also had a tremor.   Naomie Dean, MD  [facility] [address] [place], [place] [address]  Phone [phone] Fax [phone]

## 2015-02-06 NOTE — Patient Instructions (Signed)
Overall you are doing fairly well but I do want to suggest a few things today:   Remember to drink plenty of fluid, eat healthy meals and do not skip any meals. Try to eat protein with a every meal and eat a healthy snack such as fruit or nuts in between meals. Try to keep a regular sleep-wake schedule and try to exercise daily, particularly in the form of walking, 20-30 minutes a day, if you can.   As far as diagnostic testing: MRi of the brain and neck, physical therapy for balance  Our phone number is [phone]. We also have an after hours call service for urgent matters and there is a physician on-call for urgent questions. For any emergencies you know to call 911 or go to the nearest emergency room

## 2015-02-17 ENCOUNTER — Ambulatory Visit (INDEPENDENT_AMBULATORY_CARE_PROVIDER_SITE_OTHER): Payer: Federal, State, Local not specified - PPO | Admitting: Internal Medicine

## 2015-02-17 VITALS — BP 145/87 | HR 88 | Temp 98.7°F | Resp 18

## 2015-02-17 DIAGNOSIS — M5416 Radiculopathy, lumbar region: Secondary | ICD-10-CM | POA: Diagnosis not present

## 2015-02-17 DIAGNOSIS — M47817 Spondylosis without myelopathy or radiculopathy, lumbosacral region: Secondary | ICD-10-CM | POA: Diagnosis not present

## 2015-02-17 DIAGNOSIS — M5136 Other intervertebral disc degeneration, lumbar region: Secondary | ICD-10-CM

## 2015-02-17 MED ORDER — MELOXICAM 15 MG PO TABS: 15.0000 mg | ORAL_TABLET | Freq: Every day | ORAL | Status: DC

## 2015-02-17 MED ORDER — PREDNISONE 20 MG PO TABS: ORAL_TABLET | ORAL | Status: DC

## 2015-02-17 MED ORDER — KETOROLAC TROMETHAMINE 60 MG/2ML IM SOLN
60.0000 mg | Freq: Once | INTRAMUSCULAR | Status: AC
  Administered 2015-02-17: 60 mg via INTRAMUSCULAR

## 2015-02-17 MED ORDER — OXYCODONE-ACETAMINOPHEN 10-325 MG PO TABS: 1.0000 | ORAL_TABLET | Freq: Four times a day (QID) | ORAL | Status: DC | PRN

## 2015-02-17 NOTE — Progress Notes (Signed)
Subjective:    Patient ID: Mandy Diaz, female    DOB: 13-Dec-1950, 65 y.o.   MRN: [id number] By signing my name below, I, Javier Docker, attest that this documentation has been prepared under the direction and in the presence of Ellamae Sia, MD. Electronically Signed: Javier Docker, ER Scribe. 02/17/2015. 3:48 PM.  Chief Complaint  Patient presents with  . back pain left side    HPI HPI Comments: Mandy Diaz is a 65 y.o. female with a past hx of fibromyalgia who presents to [facility] complaining of lower back pain that radiates down the lateral side of her left leg for the last 3-4 days. She denies dysuria. She has theses sx intermittently and when she does she concerned to walk because she is worried that her leg will give out on her. These sx have been worsening over the last several years. She has been seen by a orthopedist in the past and received xrays of her lower back. She was scheduled for an MRI on Monday of last week but she had to cancel the appointment because she did not believe she could lay still on her back for the MRI.   Past Medical History  Diagnosis Date  . Menopause   . GERD (gastroesophageal reflux disease)   . Urinary incontinence   . Migraine   . Depression   . Anxiety   . Hypertension   . Thyroid disease   . Fibromyalgia 12/2012    s/p rheumatology consultation to confirm diagnosis; no evidence of autoimmune process.  . IBS (irritable bowel syndrome)    Allergies  Allergen Reactions  . Amoxicillin     REACTION: Rash  . Metoclopramide Hcl     REACTION: Affected nerves  . Penicillins     REACTION: Rash  . Reglan [Metoclopramide]   . Sulfamethoxazole     REACTION: Sores in mouth   Current Outpatient Prescriptions on File Prior to Visit  Medication Sig Dispense Refill  . atenolol (TENORMIN) 25 MG tablet Take 1 tablet (25 mg total) by mouth daily. 90 tablet 1  . buPROPion (WELLBUTRIN XL) 300 MG 24 hr tablet Take 1 tablet (300 mg total) by  mouth daily. 90 tablet 1  . butalbital-acetaminophen-caffeine (FIORICET, ESGIC) 50-325-40 MG tablet TAKE ONE TABLET EVERY 6 HOURS AS NEEDED FOR HEADACHES 30 tablet 0  . Cholecalciferol (VITAMIN D3) 3000 UNITS TABS Take 4,000 Units by mouth 2 (two) times daily.     . clidinium-chlordiazePOXIDE (LIBRAX) 5-2.5 MG capsule Take 1 capsule by mouth 3 (three) times daily before meals. 60 capsule 1  . clonazePAM (KLONOPIN) 0.5 MG tablet TAKE 1 TABLET BY MOUTH 2 TIMES DAILY AS NEEDED FOR ANXIETY 60 tablet 0  . cyanocobalamin (,VITAMIN B-12,) 1000 MCG/ML injection Inject 1 mL (1,000 mcg total) into the muscle every 14 (fourteen) days. (Patient taking differently: Inject 1,000 mcg into the muscle every 7 (seven) days. ) 6 mL 3  . cyclobenzaprine (FLEXERIL) 5 MG tablet Take 1 tablet (5 mg total) by mouth 3 (three) times daily as needed for muscle spasms. 1 morning, mid-afternoon and bedtime for muscle spasm 30 tablet 2  . DULoxetine (CYMBALTA) 60 MG capsule Take 1 capsule (60 mg total) by mouth daily. 90 capsule 1  . estradiol (ESTRACE) 1 MG tablet TAKE 1 TABLET (  TOTAL) BY MOUTH DAILY 90 tablet 0  . Magnesium Malate POWD 1,350 mg by Does not apply route daily. Take 1, 350 times 3    . metroNIDAZOLE (METROGEL)  1 % gel Apply topically daily. 45 g 3  . NATURE-THROID 65 MG tablet Take 65 mg by mouth every morning.  2  . NON FORMULARY Take 150 mg by mouth every morning. PREGNENOLONE (for memory and hormonal balance).    . NON FORMULARY Take 165 mg by mouth daily. ALL ADRENAL RAW.    . pantoprazole (PROTONIX) 40 MG tablet Take 1 tablet (40 mg total) by mouth daily. 90 tablet 3  . Probiotic Product (PROBIOTIC DAILY PO) Take 1 capsule by mouth daily.    . progesterone (PROMETRIUM) 100 MG capsule Take 100 mg by mouth At bedtime.    . promethazine (PHENERGAN) 12.5 MG tablet TAKE 1 TABLET (12.5 MG TOTAL) BY MOUTH EVERY 8 (EIGHT) HOURS AS NEEDED FOR NAUSEA OR VOMITING. 30 tablet 0  . SUMAtriptan (IMITREX) 100 MG tablet  TAKE 1 TABLET BY MOUTH AT ONSET OF HEADACHE, MAY REPEAT ONCE IF NEEDED 9 tablet 5  . Thyroid 48.75 MG TABS Take 1 tablet by mouth 2 (two) times daily.     . traMADol (ULTRAM) 50 MG tablet Take 1 tablet (50 mg total) by mouth every 6 (six) hours as needed. 45 tablet 0  . traZODone (DESYREL) 50 MG tablet Take 1-2 tablets by mouth at bedtime as needed for sleep. 180 tablet 1  . tretinoin (RETIN-A) 0.05 % cream Apply topically at bedtime. 45 g 3  . valACYclovir (VALTREX) 500 MG tablet Take 1 tablet (500 mg total) by mouth 3 (three) times daily. For viral infection 90 tablet 1  . zoster vaccine live, PF, (ZOSTAVAX) 16109 UNT/0.65ML injection Inject 19,400 Units into the skin once. 0.65 mL 0  . [DISCONTINUED] omeprazole (PRILOSEC) 40 MG capsule Take 1 capsule (40 mg total) by mouth 2 (two) times daily as needed. For severe GERD 60 capsule 11   No current facility-administered medications on file prior to visit.    Review of Systems  Constitutional: Positive for fever. Negative for chills, fatigue and unexpected weight change.  Respiratory: Negative for shortness of breath.   Cardiovascular: Negative for chest pain.  Genitourinary: Negative for difficulty urinating.  Musculoskeletal: Positive for gait problem.  Neurological: Positive for weakness (Secondary to pain). Negative for numbness.  Psychiatric/Behavioral: Positive for dysphoric mood.       Objective:  BP 145/87 mmHg  Pulse 88  Temp(Src) 98.7 F (37.1 C) (Oral)  Resp 18  Ht   Wt   SpO2 98%  Physical Exam  Constitutional: She is oriented to person, place, and time. She appears well-developed and well-nourished. No distress.  In distress in obvious pain.   HENT:  Head: Normocephalic and atraumatic.  Eyes: Pupils are equal, round, and reactive to light.  Neck: Neck supple.  Cardiovascular: Normal rate.   Pulmonary/Chest: Effort normal. No respiratory distress.  Musculoskeletal: Normal range of motion.  Tender in the left  sciatic notch and left lumbar. Straight leg raise on positive on left with pain at 60 degrees and redicular sx. No clonus. Pain with ROM of spine in lumbar area. No motor or sensory losses in legs. Patella reflexes 3+ and symmetrical.   Neurological: She is alert and oriented to person, place, and time. Coordination normal.  Skin: Skin is warm and dry. She is not diaphoretic.  Psychiatric: She has a normal mood and affect. Her behavior is normal.  Nursing note and vitals reviewed.     Assessment & Plan:  Lumbar radiculopathy, acute - Plan: ketorolac (TORADOL) injection 60 mg, MR Lumbar Spine Wo Contrast  DDD (degenerative disc disease), lumbar - Plan: MR Lumbar Spine Wo Contrast  Lumbosacral spondylosis without myelopathy - Plan: MR Lumbar Spine Wo Contrast  Meds ordered this encounter  Medications  . ketorolac (TORADOL) injection 60 mg    Sig:   . meloxicam (MOBIC) 15 MG tablet    Sig: Take 1 tablet (15 mg total) by mouth daily.    Dispense:  30 tablet    Refill:  0  . predniSONE (DELTASONE) 20 MG tablet    Sig: 3/3/2/2/1/1 single daily dose for 6 days    Dispense:  12 tablet    Refill:  0  . oxyCODONE-acetaminophen (PERCOCET) 10-325 MG tablet    Sig: Take 1 tablet by mouth every 6 (six) hours as needed for pain.    Dispense:  12 tablet    Refill:  0   MRI can direct ref to NS vs Ortho  I have completed the patient encounter in its entirety as documented by the scribe, with editing by me where necessary. Lamin Chandley P. Merla Riches, M.D.

## 2015-02-20 ENCOUNTER — Telehealth: Payer: Self-pay

## 2015-02-20 NOTE — Telephone Encounter (Signed)
Sure--set up fast track to see Joni Reining or michael for injection toradol 60 maybe 2 h before mri

## 2015-02-20 NOTE — Telephone Encounter (Signed)
Patient's husband called to ask if the patient could come in prior to the scheduled MRI on Sunday at 12:45pm and receive a shot so that she will be able to lay flat [place] enough to do the MRI.  He said she is in so much pain that he doesn't think she would be able to go through the whole procedure without something to lessen the pain.  Please advise, thank you.  CB#: [phone]

## 2015-02-21 ENCOUNTER — Telehealth: Payer: Self-pay

## 2015-02-21 NOTE — Telephone Encounter (Signed)
Please fast track for Joni Reining or Casimiro Needle for Toradol injection.

## 2015-02-21 NOTE — Telephone Encounter (Signed)
Will be happy to take care of this. Please just place her under my schedule.  Deliah Boston, MS, PA-C 12:23 PM, 02/21/2015

## 2015-02-21 NOTE — Telephone Encounter (Signed)
Pt is needing a refill on oxycodonine   Best number [phone]

## 2015-02-22 MED ORDER — OXYCODONE-ACETAMINOPHEN 10-325 MG PO TABS: 1.0000 | ORAL_TABLET | Freq: Four times a day (QID) | ORAL | Status: DC | PRN

## 2015-02-22 NOTE — Telephone Encounter (Signed)
Advised PT rx has been ordered.

## 2015-02-22 NOTE — Telephone Encounter (Signed)
Notified pt Rx is ready for p/up. 

## 2015-02-22 NOTE — Telephone Encounter (Signed)
?

## 2015-02-24 ENCOUNTER — Ambulatory Visit (INDEPENDENT_AMBULATORY_CARE_PROVIDER_SITE_OTHER): Payer: Federal, State, Local not specified - PPO | Admitting: Physician Assistant

## 2015-02-24 ENCOUNTER — Ambulatory Visit
Admission: RE | Admit: 2015-02-24 | Discharge: 2015-02-24 | Disposition: A | Discharge status: Home / Self Care | Payer: Federal, State, Local not specified - PPO | Source: Ambulatory Visit | Attending: Internal Medicine | Admitting: Internal Medicine

## 2015-02-24 DIAGNOSIS — R112 Nausea with vomiting, unspecified: Secondary | ICD-10-CM | POA: Diagnosis not present

## 2015-02-24 DIAGNOSIS — M545 Low back pain: Secondary | ICD-10-CM

## 2015-02-24 DIAGNOSIS — M5136 Other intervertebral disc degeneration, lumbar region: Secondary | ICD-10-CM

## 2015-02-24 DIAGNOSIS — M5416 Radiculopathy, lumbar region: Secondary | ICD-10-CM

## 2015-02-24 DIAGNOSIS — M47817 Spondylosis without myelopathy or radiculopathy, lumbosacral region: Secondary | ICD-10-CM

## 2015-02-24 MED ORDER — KETOROLAC TROMETHAMINE 60 MG/2ML IM SOLN
60.0000 mg | Freq: Once | INTRAMUSCULAR | Status: AC
  Administered 2015-02-24: 60 mg via INTRAMUSCULAR

## 2015-02-24 MED ORDER — ONDANSETRON HCL 4 MG/2ML IJ SOLN
2.0000 mg | Freq: Once | INTRAMUSCULAR | Status: AC
  Administered 2015-02-24: 2 mg via INTRAMUSCULAR

## 2015-02-24 NOTE — Progress Notes (Signed)
Patient here today because she is going for an MRI for low back pain. She was advised NPO and has been unable to take any pain medication today.  She is here for an inection of Toradol and complains of some mild nausea due to the pain today.  She is requesting something for nausea as well.  Mandy Boston, MS, PA-C 10:42 AM, 02/24/2015  Diagnoses and all orders for this visit:  Low back pain, unspecified back pain laterality, with sciatica presence unspecified -     ketorolac (TORADOL) injection 60 mg; Inject 2 mLs (60 mg total) into the muscle once.  Nausea and vomiting, intractability of vomiting not specified, unspecified vomiting type -     ondansetron (ZOFRAN) injection 2 mg; Inject 1 mL (2 mg total) into the muscle once.

## 2015-02-27 ENCOUNTER — Telehealth: Payer: Self-pay

## 2015-02-27 ENCOUNTER — Ambulatory Visit (INDEPENDENT_AMBULATORY_CARE_PROVIDER_SITE_OTHER): Payer: Federal, State, Local not specified - PPO | Admitting: Family Medicine

## 2015-02-27 VITALS — BP 120/80 | HR 97 | Temp 98.9°F | Resp 16 | Ht 65.0 in | Wt 163.0 lb

## 2015-02-27 DIAGNOSIS — H8111 Benign paroxysmal vertigo, right ear: Secondary | ICD-10-CM | POA: Diagnosis not present

## 2015-02-27 DIAGNOSIS — T148XXA Other injury of unspecified body region, initial encounter: Secondary | ICD-10-CM

## 2015-02-27 DIAGNOSIS — M5416 Radiculopathy, lumbar region: Secondary | ICD-10-CM

## 2015-02-27 DIAGNOSIS — Z79899 Other long term (current) drug therapy: Secondary | ICD-10-CM | POA: Diagnosis not present

## 2015-02-27 DIAGNOSIS — M5106 Intervertebral disc disorders with myelopathy, lumbar region: Secondary | ICD-10-CM

## 2015-02-27 MED ORDER — MECLIZINE HCL 25 MG PO TABS: 25.0000 mg | ORAL_TABLET | Freq: Three times a day (TID) | ORAL | Status: DC | PRN

## 2015-02-27 NOTE — Telephone Encounter (Signed)
Spoke to pt regarding MRI results.   Pt said last night she started having severe dizziness. Pt has had difficulty with even lifting her head off the bed. Pt is wondering if this could be caused by the prednisone.

## 2015-02-27 NOTE — Telephone Encounter (Signed)
Disregard message sent to PA pool. I spoke to Dr. Merla Riches.

## 2015-02-27 NOTE — Telephone Encounter (Signed)
Informed pt of MRI results. She would like neurosurgery referral and wants your opinion on who she should be referred to. I told her you could put a suggestion in with the referral and that's who referrals can try to get you in with.

## 2015-02-27 NOTE — Telephone Encounter (Signed)
Informed pt of results.

## 2015-02-27 NOTE — Telephone Encounter (Signed)
Instructed pt what Dr. Merla Riches said. Advised pt per Dr. Merla Riches if dizziness does not subside she will need to be seen. He will not prescribe a medication for vertigo without evaluation.

## 2015-02-27 NOTE — Progress Notes (Signed)
[facility] [address] [place] [address] [phone]  Date:  02/27/2015   Name:  Mandy Diaz   DOB:  [birth year]   MRN:  [id number]  PCP:  Nilda Simmer, MD    Chief Complaint: Dizziness; Rash; Leg Pain; and cant eat   History of Present Illness:  Mandy Diaz is a 65 y.o. very pleasant female patient who presents with the following:  She is here today with several concerns-  She just had a lumbar MRI 3 days ago, results as below: IMPRESSION: 1. At L5-S1 there is a right paracentral disc protrusion abutting the right intraspinal S1 nerve root. Mild bilateral facet arthropathy. 2. At L4-5 there is a mild broad-based disc bulge. Severe left and mild right facet arthropathy. Bilateral lateral recess stenosis, left greater than right. Mild spinal stenosis. 3. At L2-3 there is a mild broad-based disc bulge. Mild right paracentral disc protrusion abutting the right intraspinal L3 nerve Root.  She has had back trouble for several years, and also has FBM. This has made it hard to tell where her pain was coming from a lot of the time.   She noted onset of dizziness yesterday. She started a prednisone taper that she was rx on 1/29 just yesterday. She noted a senstiaon "of an ice freeze in my chest" yesterday when she tried to swallow anything, it would feel too cold.   Then yesterday she had onset of vertigo that could be severe with any movement.  She fell off the toilet yesterday evening but did not hit head, did not injure herself.  No other falls.  She did pull the left side of her groin/ left inner thigh when she fell.  No vomiting Sx are definitely associated with movement.  She has not had this in the past.  She states taht she feels "funny headed" but that she does not have a lot of vertigo as [place] as she holds still  She also plans to have an MRI done of her head and neck- this will be done per her neurologist Dr. [name].  She is undergoing neuro eval for  gait changes, ataxia, sx NOT through to be due to parkinson's disease  She notes a HA, she feels nauseated and took a phenergan this am.  She often suffers from headaches and nausea  She also has a red mark on her left cheek today- she does not think that she hit her face when she fell yesterday and she does have a history of rosacea   She has multiple medications but states that no other new or different medications recently She did get a shot of toradol and zofran on Sunday as she could not take her regular meds in anticipation of MRI  No new ringing or buzzing in her ears No vision change  Patient Active Problem List   Diagnosis Date Noted  . Acne rosacea 10/26/2014  . Fibromyalgia 04/09/2013  . Insomnia 04/09/2013  . Microscopic hematuria 02/19/2012  . UTI (lower urinary tract infection) 02/17/2012  . Eczema 02/17/2012  . Vitamin D deficiency 03/29/2011  . CAROTID ARTERY DISEASE 12/18/2009  . Migraine 09/03/2009  . APHTHOUS STOMATITIS 09/03/2009  . MUSCLE SPASM, BACK 06/12/2009  . OSTEOPENIA 04/18/2009  . Tachycardia 11/28/2008  . FREQUENCY, URINARY 11/15/2008  . Osteoarthritis of both hands 07/11/2008  . VITAMIN B12 DEFICIENCY 11/02/2007  . IRRITABLE BOWEL SYNDROME 10/06/2007  . 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 ANXIETY DEPRESSION  12/28/2006  . GERD 09/09/2006    Past Medical History  Diagnosis Date  . Menopause   . GERD (gastroesophageal reflux disease)   . Urinary incontinence   . Migraine   . Depression   . Anxiety   . Hypertension   . Thyroid disease   . Fibromyalgia 12/2012    s/p rheumatology consultation to confirm diagnosis; no evidence of autoimmune process.  . IBS (irritable bowel syndrome)     Past Surgical History  Procedure Laterality Date  . Cholecystectomy      Social History  Substance Use Topics  . Smoking status: Never Smoker   . Smokeless tobacco: Never Used  . Alcohol Use: No    Family History  Problem  Relation Age of Onset  . Arthritis    . Diabetes      1st degree relative  . Hyperlipidemia    . Stroke      Female 1st degree relative <50  . Heart disease    . Cancer Mother   . Stroke Mother   . Tremor Mother   . Cancer Father   . Heart disease Father   . Diabetes Brother   . Heart disease Brother   . Neuropathy Brother   . Heart disease Daughter   . Aneurysm Paternal Grandfather     Allergies  Allergen Reactions  . Amoxicillin     REACTION: Rash  . Metoclopramide Hcl     REACTION: Affected nerves  . Penicillins     REACTION: Rash  . Reglan [Metoclopramide]   . Sulfamethoxazole     REACTION: Sores in mouth    Medication list has been reviewed and updated.  Current Outpatient Prescriptions on File Prior to Visit  Medication Sig Dispense Refill  . atenolol (TENORMIN) 25 MG tablet Take 1 tablet (25 mg total) by mouth daily. 90 tablet 1  . buPROPion (WELLBUTRIN XL) 300 MG 24 hr tablet Take 1 tablet (300 mg total) by mouth daily. 90 tablet 1  . butalbital-acetaminophen-caffeine (FIORICET, ESGIC) 50-325-40 MG tablet TAKE ONE TABLET EVERY 6 HOURS AS NEEDED FOR HEADACHES 30 tablet 0  . Cholecalciferol (VITAMIN D3) 3000 UNITS TABS Take 4,000 Units by mouth 2 (two) times daily.     . clidinium-chlordiazePOXIDE (LIBRAX) 5-2.5 MG capsule Take 1 capsule by mouth 3 (three) times daily before meals. 60 capsule 1  . clonazePAM (KLONOPIN) 0.5 MG tablet TAKE 1 TABLET BY MOUTH 2 TIMES DAILY AS NEEDED FOR ANXIETY 60 tablet 0  . cyanocobalamin (,VITAMIN B-12,) 1000 MCG/ML injection Inject 1 mL (1,000 mcg total) into the muscle every 14 (fourteen) days. (Patient taking differently: Inject 1,000 mcg into the muscle every 7 (seven) days. ) 6 mL 3  . cyclobenzaprine (FLEXERIL) 5 MG tablet Take 1 tablet (5 mg total) by mouth 3 (three) times daily as needed for muscle spasms. 1 morning, mid-afternoon and bedtime for muscle spasm 30 tablet 2  . DULoxetine (CYMBALTA) 60 MG capsule Take 1 capsule (60  mg total) by mouth daily. 90 capsule 1  . estradiol (ESTRACE) 1 MG tablet TAKE 1 TABLET (  TOTAL) BY MOUTH DAILY 90 tablet 0  . Magnesium Malate POWD 1,350 mg by Does not apply route daily. Take 1, 350 times 3    . meloxicam (MOBIC) 15 MG tablet Take 1 tablet (15 mg total) by mouth daily. 30 tablet 0  . metroNIDAZOLE (METROGEL) 1 % gel Apply topically daily. 45 g 3  . NATURE-THROID 65 MG tablet Take 65 mg by mouth every  morning.  2  . NON FORMULARY Take 150 mg by mouth every morning. PREGNENOLONE (for memory and hormonal balance).    . NON FORMULARY Take 165 mg by mouth daily. ALL ADRENAL RAW.    Marland Kitchen oxyCODONE-acetaminophen (PERCOCET) 10-325 MG tablet Take 1 tablet by mouth every 6 (six) hours as needed for pain. 20 tablet 0  . pantoprazole (PROTONIX) 40 MG tablet Take 1 tablet (40 mg total) by mouth daily. 90 tablet 3  . predniSONE (DELTASONE) 20 MG tablet 3/3/2/2/1/1 single daily dose for 6 days 12 tablet 0  . Probiotic Product (PROBIOTIC DAILY PO) Take 1 capsule by mouth daily.    . progesterone (PROMETRIUM) 100 MG capsule Take 100 mg by mouth At bedtime.    . promethazine (PHENERGAN) 12.5 MG tablet TAKE 1 TABLET (12.5 MG TOTAL) BY MOUTH EVERY 8 (EIGHT) HOURS AS NEEDED FOR NAUSEA OR VOMITING. 30 tablet 0  . SUMAtriptan (IMITREX) 100 MG tablet TAKE 1 TABLET BY MOUTH AT ONSET OF HEADACHE, MAY REPEAT ONCE IF NEEDED 9 tablet 5  . Thyroid 48.75 MG TABS Take 1 tablet by mouth 2 (two) times daily.     . traMADol (ULTRAM) 50 MG tablet Take 1 tablet (50 mg total) by mouth every 6 (six) hours as needed. 45 tablet 0  . traZODone (DESYREL) 50 MG tablet Take 1-2 tablets by mouth at bedtime as needed for sleep. 180 tablet 1  . tretinoin (RETIN-A) 0.05 % cream Apply topically at bedtime. 45 g 3  . valACYclovir (VALTREX) 500 MG tablet Take 1 tablet (500 mg total) by mouth 3 (three) times daily. For viral infection 90 tablet 1  . zoster vaccine live, PF, (ZOSTAVAX) 40981 UNT/0.65ML injection Inject 19,400  Units into the skin once. 0.65 mL 0  . [DISCONTINUED] omeprazole (PRILOSEC) 40 MG capsule Take 1 capsule (40 mg total) by mouth 2 (two) times daily as needed. For severe GERD 60 capsule 11   No current facility-administered medications on file prior to visit.    Review of Systems:  As per HPI- otherwise negative.  Physical Examination: Filed Vitals:   02/27/15 1521  BP: 152/82  Pulse: 97  Temp: 98.9 F (37.2 C)  Resp: 16   Filed Vitals:   02/27/15 1521  Height:  (1.651 m)  Weight: 163 lb (73.936 kg)   Body mass index is 27.12 kg/(m^2). Ideal Body Weight: Weight in (lb) to have BMI = 25: 149.9  GEN: WDWN, NAD, Non-toxic, A & O x 3, looks well, here today with her husband HEENT: Atraumatic, Normocephalic. Neck supple. No masses, No LAD.  Bilateral TM wnl, oropharynx normal.  PEERL,EOMI.   Ears and Nose: No external deformity. CV: RRR, No M/G/R. No JVD. No thrill. No extra heart sounds. PULM: CTA B, no wheezes, crackles, rhonchi. No retractions. No resp. distress. No accessory muscle use. ABD: S, NT, ND EXTR: No c/c/e Mild tenderness of muscles of left inner thigh NEURO Normal gait.  PSYCH: Normally interactive. Conversant. Not depressed or anxious appearing.  Calm demeanor.  Performed a full neuro exam including sensation and strength of all extremities and facial muscles, DTR of all extremities, romberg- all normal Positive dix- halpike to the right.  Did not test the left as response to the right showed dramatic nystagmus  Assessment and Plan: BPPV (benign paroxysmal positional vertigo), right - Plan: meclizine (ANTIVERT) 25 MG tablet  Polypharmacy  Pulled muscle  Here today to discuss her MRI which we went over briefly- she has already read the  report on her mychart.  Will ask Dr. Merla Riches to make sure she gets her desired follow-up BPPV with typical sx and exam.  Discussed treatment options.  I am concerned about the number of sedating medications that she is  taking and discussed this concern with her.  She states that most of these medications are prn and she understands the need to avoid mixing and combining sedating medications.  rx for meclizine and cautioned regarding sedation I feel that it is unlikely that the prednisone triggered this episode of BPPV but she will stay off this medication until her sx resolve.  At that time she can try again if she likes Reassured that the pulled muscle in her leg will likely get better soon She will follow-up if not improving   Signed Abbe Amsterdam, MD

## 2015-02-27 NOTE — Patient Instructions (Signed)
You seem to have BPPV- this is a very common type of vertigo Use the meclizine medication as needed for dizziness.  Do not drive while you are suffering from dizziness.   Let me know if your dizziness is not improving over the next few days or if you have any change or worsening of your symptoms You have several medications that can be sedating including flexeril, your pain medication, clonazepam, librax, phenergan, fioricet, tramadol and trazodone. Try to limit your use other other sedating medications while you are taking the meclizine to minimize any compounding of the sedating effects of these medications  Benign Positional Vertigo Vertigo is the feeling that you or your surroundings are moving when they are not. Benign positional vertigo is the most common form of vertigo. The cause of this condition is not serious (is benign). This condition is triggered by certain movements and positions (is positional). This condition can be dangerous if it occurs while you are doing something that could endanger you or others, such as driving.  CAUSES In many cases, the cause of this condition is not known. It may be caused by a disturbance in an area of the inner ear that helps your brain to sense movement and balance. This disturbance can be caused by a viral infection (labyrinthitis), head injury, or repetitive motion. RISK FACTORS This condition is more likely to develop in:  Women.  People who are 32 years of age or older. SYMPTOMS Symptoms of this condition usually happen when you move your head or your eyes in different directions. Symptoms may start suddenly, and they usually last for less than a minute. Symptoms may include:  Loss of balance and falling.  Feeling like you are spinning or moving.  Feeling like your surroundings are spinning or moving.  Nausea and vomiting.  Blurred vision.  Dizziness.  Involuntary eye movement (nystagmus). Symptoms can be mild and cause only slight  annoyance, or they can be severe and interfere with daily life. Episodes of benign positional vertigo may return (recur) over time, and they may be triggered by certain movements. Symptoms may improve over time. DIAGNOSIS This condition is usually diagnosed by medical history and a physical exam of the head, neck, and ears. You may be referred to a health care provider who specializes in ear, nose, and throat (ENT) problems (otolaryngologist) or a provider who specializes in disorders of the nervous system (neurologist). You may have additional testing, including:  MRI.  A CT scan.  Eye movement tests. Your health care provider may ask you to change positions quickly while he or she watches you for symptoms of benign positional vertigo, such as nystagmus. Eye movement may be tested with an electronystagmogram (ENG), caloric stimulation, the Dix-Hallpike test, or the roll test.  An electroencephalogram (EEG). This records electrical activity in your brain.  Hearing tests. TREATMENT Usually, your health care provider will treat this by moving your head in specific positions to adjust your inner ear back to normal. Surgery may be needed in severe cases, but this is rare. In some cases, benign positional vertigo may resolve on its own in 2-4 weeks. HOME CARE INSTRUCTIONS Safety  Move slowly.Avoid sudden body or head movements.  Avoid driving.  Avoid operating heavy machinery.  Avoid doing any tasks that would be dangerous to you or others if a vertigo episode would occur.  If you have trouble walking or keeping your balance, try using a cane for stability. If you feel dizzy or unstable, sit down right  away.  Return to your normal activities as told by your health care provider. Ask your health care provider what activities are safe for you. General Instructions  Take over-the-counter and prescription medicines only as told by your health care provider.  Avoid certain positions or  movements as told by your health care provider.  Drink enough fluid to keep your urine clear or pale yellow.  Keep all follow-up visits as told by your health care provider. This is important. SEEK MEDICAL CARE IF:  You have a fever.  Your condition gets worse or you develop new symptoms.  Your family or friends notice any behavioral changes.  Your nausea or vomiting gets worse.  You have numbness or a "pins and needles" sensation. SEEK IMMEDIATE MEDICAL CARE IF:  You have difficulty speaking or moving.  You are always dizzy.  You faint.  You develop severe headaches.  You have weakness in your legs or arms.  You have changes in your hearing or vision.  You develop a stiff neck.  You develop sensitivity to light.   This information is not intended to replace advice given to you by your health care provider. Make sure you discuss any questions you have with your health care provider.   Document Released: 10/13/2005 Document Revised: 09/26/2014 Document Reviewed: 04/30/2014 Elsevier Interactive Patient Education [employer]

## 2015-02-27 NOTE — Telephone Encounter (Signed)
Pt is looking for getting her ct results she has looked on my chart and knows that they are back   Best number [phone]

## 2015-02-28 NOTE — Addendum Note (Signed)
Addended by: Tonye Pearson on: 02/28/2015 11:15 AM   Modules accepted: Orders

## 2015-03-15 ENCOUNTER — Other Ambulatory Visit: Payer: Self-pay | Admitting: Internal Medicine

## 2015-03-20 ENCOUNTER — Other Ambulatory Visit: Payer: Self-pay | Admitting: Family Medicine

## 2015-03-21 ENCOUNTER — Other Ambulatory Visit: Payer: Self-pay | Admitting: Family Medicine

## 2015-03-24 ENCOUNTER — Other Ambulatory Visit: Payer: Self-pay | Admitting: Family Medicine

## 2015-03-24 NOTE — Telephone Encounter (Signed)
Please fax or call in refill of Tramadol.

## 2015-03-25 NOTE — Telephone Encounter (Signed)
Faxed

## 2015-03-27 ENCOUNTER — Other Ambulatory Visit: Payer: Self-pay | Admitting: Family Medicine

## 2015-03-30 ENCOUNTER — Other Ambulatory Visit: Payer: Self-pay | Admitting: Family Medicine

## 2015-04-01 ENCOUNTER — Telehealth: Payer: Self-pay

## 2015-04-01 NOTE — Telephone Encounter (Signed)
Pt states the pharmacy said we denied her IMITREX 100 MGS

## 2015-04-04 ENCOUNTER — Telehealth: Payer: Self-pay

## 2015-04-04 NOTE — Telephone Encounter (Signed)
Patient is requesting 50 MG imitrex.  SUMAtriptan (IMITREX) 100 MG tablet  [id number]  Please call patient when sent in.

## 2015-04-05 ENCOUNTER — Other Ambulatory Visit: Payer: Self-pay | Admitting: Family Medicine

## 2015-04-05 NOTE — Telephone Encounter (Signed)
It appears that we have never prescribed imitrex for patient, so she would need to RTC for refills.

## 2015-04-05 NOTE — Telephone Encounter (Signed)
Pt called checking on status of this message. I let her know she needs an OV for this refill. Please advise at [phone]

## 2015-04-05 NOTE — Telephone Encounter (Signed)
Can we refill? 

## 2015-04-06 MED ORDER — SUMATRIPTAN SUCCINATE 50 MG PO TABS: 50.0000 mg | ORAL_TABLET | Freq: Once | ORAL | Status: DC

## 2015-04-06 NOTE — Telephone Encounter (Signed)
Dr. Katrinka BlazingSmith, Mandy Diaz is coming in for a physical in April, can we refill her imtrex until this appointment.  It doesn't look like we have filled it here before

## 2015-04-06 NOTE — Telephone Encounter (Signed)
Imitrex 50mg  approved.

## 2015-04-07 NOTE — Telephone Encounter (Signed)
Lm message prescription called in

## 2015-04-09 NOTE — Telephone Encounter (Signed)
Patient called again regarding medication.  Advised her that it was called in on 04/06/15 and apologized for any inconvenience this may have caused her.

## 2015-04-14 NOTE — Telephone Encounter (Signed)
Error

## 2015-04-29 ENCOUNTER — Encounter: Payer: Federal, State, Local not specified - PPO | Admitting: Family Medicine

## 2015-05-13 ENCOUNTER — Other Ambulatory Visit: Payer: Self-pay | Admitting: Family Medicine

## 2015-05-13 NOTE — Telephone Encounter (Signed)
Denied cymbalta RF, req'd wrong sig, new dose started 01/25/15 and should have a RF left of that.

## 2015-05-14 NOTE — Telephone Encounter (Signed)
Called in.

## 2015-05-14 NOTE — Telephone Encounter (Signed)
1.  Refills --- Please call in refill of Clonazepam as approved.  2. Scheduling --- please call pt to schedule follow-up OV with me; next available appointment is fine.

## 2015-05-15 NOTE — Telephone Encounter (Signed)
Rx faxed to pharmacy, pt notified

## 2015-05-21 ENCOUNTER — Encounter: Payer: Federal, State, Local not specified - PPO | Admitting: Family Medicine

## 2015-05-22 ENCOUNTER — Other Ambulatory Visit: Payer: Self-pay | Admitting: Physician Assistant

## 2015-05-23 ENCOUNTER — Other Ambulatory Visit: Payer: Self-pay

## 2015-05-23 NOTE — Telephone Encounter (Signed)
Pt refill was denied because she has to see the doctor. Pt has an appt with Katrinka BlazingSmith 10/22/15 however it is so far out she wants to get her refills before this time.  Please advise  [phone]

## 2015-05-24 NOTE — Telephone Encounter (Signed)
The request sent 5/3 was actually denied d/t an error Noreene Larsson(Jill did not understand protocol on controlled meds at the time). I explained this to pt and she was very nice and understood. She did ask if maybe the PA, Casimiro NeedleMichael, who saw her in Jan could possible RF her tramadol since she is out of it and Dr Katrinka BlazingSmith won't be back in the office until next Tues. She is also almost out of her phenergan so I will pend that as well. Casimiro NeedleMichael please advise.

## 2015-05-27 ENCOUNTER — Telehealth: Payer: Self-pay

## 2015-05-27 MED ORDER — TRAMADOL HCL 50 MG PO TABS: 50.0000 mg | ORAL_TABLET | Freq: Four times a day (QID) | ORAL | Status: DC | PRN

## 2015-05-27 MED ORDER — PROMETHAZINE HCL 12.5 MG PO TABS: ORAL_TABLET | ORAL | Status: DC

## 2015-05-27 NOTE — Telephone Encounter (Signed)
Called in Tramadol rx to [facility] on [address].  Pt aware.

## 2015-05-28 NOTE — Telephone Encounter (Signed)
Called in tramadol and notified pt these were sent on VM.

## 2015-07-13 ENCOUNTER — Other Ambulatory Visit: Payer: Self-pay | Admitting: Family Medicine

## 2015-08-09 ENCOUNTER — Other Ambulatory Visit: Payer: Self-pay | Admitting: Family Medicine

## 2015-08-09 NOTE — Telephone Encounter (Signed)
Patient is calling to see if she can get enough bupropion to last until her appointment with Dr. Katrinka BlazingSmith in October.

## 2015-08-09 NOTE — Telephone Encounter (Signed)
Pt's appt is 10/22/15. Please advise. Pended a 90 day RF.

## 2015-08-09 NOTE — Telephone Encounter (Signed)
Meds ordered this encounter  Medications  . buPROPion (WELLBUTRIN XL) 300 MG 24 hr tablet    Sig: TAKE 1 TABLET BY MOUTH EVERY DAY    Dispense:  90 tablet    Refill:  1

## 2015-08-12 NOTE — Telephone Encounter (Signed)
Notified pt that RF was sent in.

## 2015-08-13 ENCOUNTER — Other Ambulatory Visit: Payer: Self-pay | Admitting: Family Medicine

## 2015-09-19 ENCOUNTER — Other Ambulatory Visit: Payer: Self-pay | Admitting: Family Medicine

## 2015-10-01 ENCOUNTER — Ambulatory Visit
Admission: RE | Admit: 2015-10-01 | Discharge: 2015-10-01 | Disposition: A | Discharge status: Home / Self Care | Payer: Federal, State, Local not specified - PPO | Source: Ambulatory Visit | Attending: Neurology | Admitting: Neurology

## 2015-10-01 DIAGNOSIS — R270 Ataxia, unspecified: Secondary | ICD-10-CM | POA: Diagnosis not present

## 2015-10-01 DIAGNOSIS — R4789 Other speech disturbances: Secondary | ICD-10-CM

## 2015-10-01 DIAGNOSIS — R2689 Other abnormalities of gait and mobility: Secondary | ICD-10-CM

## 2015-10-01 DIAGNOSIS — R4189 Other symptoms and signs involving cognitive functions and awareness: Secondary | ICD-10-CM

## 2015-10-07 ENCOUNTER — Telehealth: Payer: Self-pay | Admitting: *Deleted

## 2015-10-07 NOTE — Telephone Encounter (Signed)
-----   Message from Anson FretAntonia B Ahern, MD sent at 10/04/2015 11:48 AM EDT ----- [facility] significant [facility]. 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 No etiology for symptoms found thanks

## 2015-10-07 NOTE — Telephone Encounter (Signed)
LVM for pt to call about results. Gave [facility] phone number.  

## 2015-10-10 NOTE — Telephone Encounter (Signed)
Dr Lucia GaskinsAhern- see below, please advise about question RE cyst  Called and spoke to patient on mobile. She stated not to LVM on home number because it does not tell them that they have a message. She said to call her mobile and then leave a message on that.   I relayed MRI results per Dr Lucia GaskinsAhern note. She verbalized understanding but stated she knows she has the cyst and saw it had gotten bigger. She is wondering if she should be concerned about this. Advised I will give Dr Lucia GaskinsAhern the message and call back to advise.

## 2015-10-15 NOTE — Telephone Encounter (Signed)
I have called several times. Home number not in service and the cell phone voice mail is full and not accepting any messages.

## 2015-10-20 NOTE — Telephone Encounter (Signed)
Discussed with patient. Asked to follow up with me in the office and we can re-image in 6 months. We can also discuss her migraines at that time  IMPRESSION:  Abnormal MRI brain (without) demonstrating:  1. 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 This is slightly larger in size compared to MRI from 08/23/12. No hydrocephalus or change in ventricle size. Consider follow up study.  2. No acute findings  IMPRESSION:  Abnormal MRI cervical spine (without) demonstrating: 1. At C4-5: disc bulging, uncovertebral joint hypertrophy, with moderate-severe right foraminal stenosis 2. At C6-7: right sided disc bulging with moderate-severe right foraminal stenosis 3. At C5-6: disc bulging with mild right foraminal stenosis

## 2015-10-22 ENCOUNTER — Encounter: Payer: Self-pay | Admitting: Family Medicine

## 2015-10-22 ENCOUNTER — Ambulatory Visit (INDEPENDENT_AMBULATORY_CARE_PROVIDER_SITE_OTHER): Payer: Federal, State, Local not specified - PPO | Admitting: Family Medicine

## 2015-10-22 VITALS — BP 136/86 | HR 107 | Temp 98.6°F | Resp 18 | Wt 156.6 lb

## 2015-10-22 DIAGNOSIS — K580 Irritable bowel syndrome with diarrhea: Secondary | ICD-10-CM | POA: Diagnosis not present

## 2015-10-22 DIAGNOSIS — R519 Headache, unspecified: Secondary | ICD-10-CM

## 2015-10-22 DIAGNOSIS — G43709 Chronic migraine without aura, not intractable, without status migrainosus: Secondary | ICD-10-CM

## 2015-10-22 DIAGNOSIS — M949 Disorder of cartilage, unspecified: Secondary | ICD-10-CM

## 2015-10-22 DIAGNOSIS — M19041 Primary osteoarthritis, right hand: Secondary | ICD-10-CM

## 2015-10-22 DIAGNOSIS — R768 Other specified abnormal immunological findings in serum: Secondary | ICD-10-CM | POA: Diagnosis not present

## 2015-10-22 DIAGNOSIS — F5104 Psychophysiologic insomnia: Secondary | ICD-10-CM

## 2015-10-22 DIAGNOSIS — K219 Gastro-esophageal reflux disease without esophagitis: Secondary | ICD-10-CM

## 2015-10-22 DIAGNOSIS — M797 Fibromyalgia: Secondary | ICD-10-CM

## 2015-10-22 DIAGNOSIS — J301 Allergic rhinitis due to pollen: Secondary | ICD-10-CM

## 2015-10-22 DIAGNOSIS — M19042 Primary osteoarthritis, left hand: Secondary | ICD-10-CM

## 2015-10-22 DIAGNOSIS — R51 Headache: Secondary | ICD-10-CM | POA: Diagnosis not present

## 2015-10-22 DIAGNOSIS — M899 Disorder of bone, unspecified: Secondary | ICD-10-CM | POA: Diagnosis not present

## 2015-10-22 DIAGNOSIS — L309 Dermatitis, unspecified: Secondary | ICD-10-CM

## 2015-10-22 MED ORDER — CILIDINIUM-CHLORDIAZEPOXIDE 2.5-5 MG PO CAPS: 1.0000 | ORAL_CAPSULE | Freq: Three times a day (TID) | ORAL | 0 refills | Status: DC

## 2015-10-22 MED ORDER — CLONAZEPAM 0.5 MG PO TABS: 0.5000 mg | ORAL_TABLET | Freq: Every day | ORAL | 1 refills | Status: DC | PRN

## 2015-10-22 MED ORDER — ONDANSETRON 8 MG PO TBDP: 8.0000 mg | ORAL_TABLET | Freq: Three times a day (TID) | ORAL | 1 refills | Status: DC | PRN

## 2015-10-22 MED ORDER — OMEPRAZOLE 20 MG PO CPDR: 20.0000 mg | DELAYED_RELEASE_CAPSULE | Freq: Every day | ORAL | 3 refills | Status: DC

## 2015-10-22 MED ORDER — DULOXETINE HCL 60 MG PO CPEP: 120.0000 mg | ORAL_CAPSULE | Freq: Every day | ORAL | 1 refills | Status: DC

## 2015-10-22 MED ORDER — TRAMADOL HCL 50 MG PO TABS: 50.0000 mg | ORAL_TABLET | Freq: Four times a day (QID) | ORAL | 0 refills | Status: DC | PRN

## 2015-10-22 MED ORDER — TRAZODONE HCL 50 MG PO TABS: ORAL_TABLET | ORAL | 1 refills | Status: DC

## 2015-10-22 MED ORDER — BUTALBITAL-APAP-CAFFEINE 50-325-40 MG PO TABS: ORAL_TABLET | ORAL | 0 refills | Status: DC

## 2015-10-22 MED ORDER — BUPROPION HCL ER (XL) 150 MG PO TB24: 150.0000 mg | ORAL_TABLET | Freq: Every day | ORAL | 1 refills | Status: DC

## 2015-10-22 MED ORDER — MELOXICAM 15 MG PO TABS: 15.0000 mg | ORAL_TABLET | Freq: Every day | ORAL | 0 refills | Status: DC

## 2015-10-22 MED ORDER — ATENOLOL 25 MG PO TABS: 25.0000 mg | ORAL_TABLET | Freq: Every day | ORAL | 1 refills | Status: DC

## 2015-10-22 MED ORDER — CLOBETASOL PROPIONATE 0.05 % EX CREA: 1.0000 "application " | TOPICAL_CREAM | Freq: Two times a day (BID) | CUTANEOUS | 0 refills | Status: DC

## 2015-10-22 MED ORDER — FLUTICASONE PROPIONATE 50 MCG/ACT NA SUSP: 2.0000 | Freq: Every day | NASAL | 6 refills | Status: DC

## 2015-10-22 NOTE — Patient Instructions (Signed)
     IF you received an x-ray today, you will receive an invoice from [facility]. Please contact [facility] at [phone] with questions or concerns regarding your invoice.   IF you received labwork today, you will receive an invoice from [facility]. Please contact [facility] at [phone] with questions or concerns regarding your invoice.   Our billing staff will not be able to assist you with questions regarding bills from these companies.  You will be contacted with the lab results as soon as they are available. The fastest way to get your results is to activate your My Chart account. Instructions are located on the last page of this paperwork. If you have not heard from us regarding the results in 2 weeks, please contact this office.      
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
## 2015-10-25 ENCOUNTER — Other Ambulatory Visit: Payer: Self-pay | Admitting: Physician Assistant

## 2015-10-25 ENCOUNTER — Other Ambulatory Visit: Payer: Self-pay | Admitting: Family Medicine

## 2015-10-28 ENCOUNTER — Other Ambulatory Visit: Payer: Self-pay | Admitting: Family Medicine

## 2015-10-29 NOTE — Telephone Encounter (Signed)
10/2015 last ov 01/2015 last labs

## 2015-11-01 ENCOUNTER — Telehealth: Payer: Self-pay

## 2015-11-01 NOTE — Telephone Encounter (Signed)
traMADol (ULTRAM) 50 MG tablet, clidinium-chlordiazePOXIDE (LIBRAX) 5-2.5 MG capsule, butalbital-acetaminophen-caffeine (FIORICET, ESGIC) 50-325-40 MG tablet   Patient request a refill on the following medication. Patient stated pharmacy requested refill last week.   [id number]- [place], [place] - [address]

## 2015-11-02 NOTE — Telephone Encounter (Signed)
Called to pharmacy, was okd 10/30/15

## 2015-12-04 ENCOUNTER — Other Ambulatory Visit: Payer: Self-pay | Admitting: Family Medicine

## 2015-12-10 ENCOUNTER — Emergency Department (HOSPITAL_COMMUNITY)
Admission: EM | Admit: 2015-12-10 | Discharge: 2015-12-10 | Disposition: A | Discharge status: Home / Self Care | Payer: Federal, State, Local not specified - PPO | Attending: Emergency Medicine | Admitting: Emergency Medicine

## 2015-12-10 ENCOUNTER — Encounter (HOSPITAL_COMMUNITY): Payer: Self-pay | Admitting: Emergency Medicine

## 2015-12-10 ENCOUNTER — Emergency Department (HOSPITAL_COMMUNITY): Payer: Federal, State, Local not specified - PPO

## 2015-12-10 DIAGNOSIS — S0101XA Laceration without foreign body of scalp, initial encounter: Secondary | ICD-10-CM | POA: Insufficient documentation

## 2015-12-10 DIAGNOSIS — Z79899 Other long term (current) drug therapy: Secondary | ICD-10-CM | POA: Insufficient documentation

## 2015-12-10 DIAGNOSIS — W228XXA Striking against or struck by other objects, initial encounter: Secondary | ICD-10-CM | POA: Insufficient documentation

## 2015-12-10 DIAGNOSIS — I10 Essential (primary) hypertension: Secondary | ICD-10-CM | POA: Diagnosis not present

## 2015-12-10 DIAGNOSIS — W19XXXA Unspecified fall, initial encounter: Secondary | ICD-10-CM

## 2015-12-10 DIAGNOSIS — Y939 Activity, unspecified: Secondary | ICD-10-CM | POA: Diagnosis not present

## 2015-12-10 DIAGNOSIS — Y999 Unspecified external cause status: Secondary | ICD-10-CM | POA: Diagnosis not present

## 2015-12-10 DIAGNOSIS — Y9289 Other specified places as the place of occurrence of the external cause: Secondary | ICD-10-CM | POA: Diagnosis not present

## 2015-12-10 DIAGNOSIS — S0990XA Unspecified injury of head, initial encounter: Secondary | ICD-10-CM | POA: Diagnosis present

## 2015-12-10 HISTORY — DX: Unspecified osteoarthritis, unspecified site: M19.90

## 2015-12-10 LAB — CBC WITH DIFFERENTIAL/PLATELET
BASOS ABS: 0 10*3/uL (ref 0.0–0.1)
BASOS PCT: 0 %
Eosinophils Absolute: 0 10*3/uL (ref 0.0–0.7)
Eosinophils Relative: 0 %
HEMATOCRIT: 38.3 % (ref 36.0–46.0)
Hemoglobin: 13 g/dL (ref 12.0–15.0)
Lymphocytes Relative: 9 %
Lymphs Abs: 1.1 10*3/uL (ref 0.7–4.0)
MCH: 28.8 pg (ref 26.0–34.0)
MCHC: 33.9 g/dL (ref 30.0–36.0)
MCV: 84.7 fL (ref 78.0–100.0)
MONO ABS: 0.4 10*3/uL (ref 0.1–1.0)
Monocytes Relative: 3 %
NEUTROS ABS: 10.8 10*3/uL — AB (ref 1.7–7.7)
Neutrophils Relative %: 88 %
Platelets: 324 10*3/uL (ref 150–400)
RBC: 4.52 MIL/uL (ref 3.87–5.11)
RDW: 13 % (ref 11.5–15.5)
WBC: 12.3 10*3/uL — ABNORMAL HIGH (ref 4.0–10.5)

## 2015-12-10 MED ORDER — LIDOCAINE-EPINEPHRINE 2 %-1:200000 IJ SOLN
10.0000 mL | Freq: Once | INTRAMUSCULAR | Status: AC
Start: 2015-12-10 — End: 2015-12-10
  Administered 2015-12-10: 10 mL via INTRADERMAL
  Filled 2015-12-10: qty 20

## 2015-12-10 MED ORDER — ONDANSETRON 4 MG PO TBDP
4.0000 mg | ORAL_TABLET | Freq: Once | ORAL | Status: AC
  Administered 2015-12-10: 4 mg via ORAL
  Filled 2015-12-10: qty 1

## 2015-12-10 MED ORDER — OXYCODONE HCL 5 MG PO TABS: 5.0000 mg | ORAL_TABLET | Freq: Four times a day (QID) | ORAL | 0 refills | Status: DC | PRN

## 2015-12-10 NOTE — ED Notes (Addendum)
Pt states she understands instructions. All questions answered . Home stable with husband via w/c. Denies nausea at this time.

## 2015-12-10 NOTE — ED Triage Notes (Addendum)
Pt arrived via EMS from home where she "fell in bathroom while at the sink." Pt reports hitting her head "on the side of the tub." 2 x 0.5 cm lac noted to the right side of head above the ear; bleeding controlled. Pt denies LOC; states she now "feels dizzy and nauseous." EMS reports pt vomit 1x in route; pt given 4mg  Zofran. Pt resp e/u; NAD noted at this time.

## 2015-12-10 NOTE — ED Notes (Signed)
Suture cart at bedside 

## 2015-12-10 NOTE — ED Notes (Signed)
Pt to xray at this time.

## 2015-12-10 NOTE — ED Provider Notes (Signed)
[facility]-EMERGENCY DEPT Provider Note   CSN: [id number] Arrival date & time: 12/10/15  1447     History   Chief Complaint Chief Complaint  Patient presents with  . Fall    HPI Mandy Diaz is a 65 y.o. female.   Fall  This is a new problem. The current episode started less than 1 hour ago. The problem occurs constantly. The problem has not changed since onset.Associated symptoms include headaches. Pertinent negatives include no chest pain, no abdominal pain and no shortness of breath. Nothing aggravates the symptoms. Nothing relieves the symptoms. Treatments tried: Veterinary surgeon.    Past Medical History:  Diagnosis Date  . Anxiety   . Depression   . Fibromyalgia 12/2012   s/p rheumatology consultation to confirm diagnosis; no evidence of autoimmune process.  Marland Kitchen GERD (gastroesophageal reflux disease)   . Hypertension   . IBS (irritable bowel syndrome)   . Menopause   . Migraine   . Osteoarthritis   . Thyroid disease   . Urinary incontinence     Patient Active Problem List   Diagnosis Date Noted  . ANA positive 10/22/2015  . Acne rosacea 10/26/2014  . Fibromyalgia 04/09/2013  . Insomnia 04/09/2013  . Microscopic hematuria 02/19/2012  . UTI (lower urinary tract infection) 02/17/2012  . Eczema 02/17/2012  . Vitamin D deficiency 03/29/2011  . CAROTID ARTERY DISEASE 12/18/2009  . Migraine 09/03/2009  . APHTHOUS STOMATITIS 09/03/2009  . MUSCLE SPASM, BACK 06/12/2009  . Disorder of bone and cartilage 04/18/2009  . Tachycardia 11/28/2008  . FREQUENCY, URINARY 11/15/2008  . Osteoarthritis of both hands 07/11/2008  . VITAMIN B12 DEFICIENCY 11/02/2007  . IRRITABLE BOWEL SYNDROME 10/06/2007  . 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 ANXIETY DEPRESSION 12/28/2006  . GERD 09/09/2006    Past Surgical History:  Procedure Laterality Date  . CHOLECYSTECTOMY      OB History    No data available       Home Medications    Prior to Admission medications     Medication Sig Start Date End Date Taking? Authorizing Provider  atenolol (TENORMIN) 25 MG tablet Take 1 tablet (25 mg total) by mouth daily. 10/22/15  Yes Ethelda Chick, MD  buPROPion (WELLBUTRIN XL) 150 MG 24 hr tablet Take 1 tablet (150 mg total) by mouth daily. 10/22/15  Yes Ethelda Chick, MD  butalbital-acetaminophen-caffeine (FIORICET, ESGIC) [phone] MG tablet TAKE ONE CAPSULE BY MOUTH EVERY 6 HOURS AS NEEDED FOR HEADACHES 10/30/15  Yes Ethelda Chick, MD  Cholecalciferol 2000 units CAPS Take 4,000 Units by mouth daily.   Yes Historical Provider, MD  clidinium-chlordiazePOXIDE (LIBRAX) 5-2.5 MG capsule TAKE ONE CAPSULE BY MOUTH 3 TIMES A DAY BEFORE MEALS Patient taking differently: TAKE ONE CAPSULE BY MOUTH DAILY AS NEEDED FOR STOMACH 10/30/15  Yes Ethelda Chick, MD  clobetasol cream (TEMOVATE) 0.05 % Apply 1 application topically 2 (two) times daily. 10/22/15  Yes Ethelda Chick, MD  clonazePAM (KLONOPIN) 0.5 MG tablet Take 1 tablet (0.5 mg total) by mouth daily as needed for anxiety. 10/22/15  Yes Ethelda Chick, MD  cyanocobalamin (,VITAMIN B-12,) 1000 MCG/ML injection Inject 1 mL (1,000 mcg total) into the muscle every 14 (fourteen) days. Patient taking differently: Inject 1,000 mcg into the muscle every Friday.  02/23/14  Yes Ethelda Chick, MD  cyclobenzaprine (FLEXERIL) 5 MG tablet Take 1 tablet (5 mg total) by mouth 3 (three) times daily as needed for muscle spasms. 1 morning, mid-afternoon and bedtime  for muscle spasm 01/25/15  Yes Ethelda ChickKristi M Smith, MD  DULoxetine (CYMBALTA) 60 MG capsule Take 2 capsules (120 mg total) by mouth daily. 10/22/15  Yes Ethelda ChickKristi M Smith, MD  estradiol (ESTRACE) 1 MG tablet TAKE 1 TABLET (1MG  TOTAL) BY MOUTH DAILY 12/06/15  Yes Ethelda ChickKristi M Smith, MD  Magnesium Malate POWD Take 3,900 mg by mouth daily. TAKES 3 x 1300MG  CAPSULES ONCE DAILY   Yes Historical Provider, MD  meclizine (ANTIVERT) 25 MG tablet Take 1 tablet (25 mg total) by mouth 3 (three) times daily as  needed for dizziness. 02/27/15  Yes Gwenlyn FoundJessica C Copland, MD  meloxicam (MOBIC) 15 MG tablet Take 1 tablet (15 mg total) by mouth daily. Patient taking differently: Take 15 mg by mouth daily as needed for pain.  10/22/15  Yes Ethelda ChickKristi M Smith, MD  NON FORMULARY Take 150 mg by mouth every morning. PROGENOLONE (for memory and hormonal balance).   Yes Historical Provider, MD  NON FORMULARY Take 2 capsules by mouth daily. ALL ADRENAL RAW.    Yes Historical Provider, MD  omeprazole (PRILOSEC) 20 MG capsule Take 1 capsule (20 mg total) by mouth daily. 10/22/15  Yes Ethelda ChickKristi M Smith, MD  ondansetron (ZOFRAN-ODT) 8 MG disintegrating tablet Take 1 tablet (8 mg total) by mouth every 8 (eight) hours as needed for nausea. 10/22/15  Yes Ethelda ChickKristi M Smith, MD  progesterone (PROMETRIUM) 200 MG capsule Take 200 mg by mouth daily.  02/23/15  Yes Historical Provider, MD  SUMAtriptan (IMITREX) 50 MG tablet Take 1 tablet (50 mg total) by mouth once. May repeat in 2 hours if headache persists or recurs. 04/06/15  Yes Ethelda ChickKristi M Smith, MD  thyroid (ARMOUR) 65 MG tablet Take 65 mg by mouth daily.   Yes Historical Provider, MD  traMADol (ULTRAM) 50 MG tablet TAKE 1 TABLET BY MOUTH EVERY 6 HOURS AS NEEDED Patient taking differently: TAKE 1 TABLET BY MOUTH EVERY 6 HOURS AS NEEDED FOR PAIN 10/30/15  Yes Ethelda ChickKristi M Smith, MD  traZODone (DESYREL) 50 MG tablet Take 1 tablet by mouth at bedtime as needed for sleep. 10/22/15  Yes Ethelda ChickKristi M Smith, MD  oxyCODONE (ROXICODONE) 5 MG immediate release tablet Take 1 tablet (5 mg total) by mouth every 6 (six) hours as needed for severe pain. 12/10/15   Cherlynn PerchesEric Kayn Haymore, MD  oxyCODONE-acetaminophen (PERCOCET) 10-325 MG tablet Take 1 tablet by mouth every 6 (six) hours as needed for pain. Patient not taking: Reported on 12/10/2015 02/22/15   Tonye Pearsonobert P Doolittle, MD  pantoprazole (PROTONIX) 40 MG tablet Take 1 tablet (40 mg total) by mouth daily. Patient not taking: Reported on 12/10/2015 01/25/15   Ethelda ChickKristi M Smith, MD   predniSONE (DELTASONE) 20 MG tablet 3/3/2/2/1/1 single daily dose for 6 days Patient not taking: Reported on 12/10/2015 02/17/15   Tonye Pearsonobert P Doolittle, MD  promethazine (PHENERGAN) 12.5 MG tablet TAKE 1 TABLET (12.5 MG TOTAL) BY MOUTH EVERY 8 (EIGHT) HOURS AS NEEDED FOR NAUSEA OR VOMITING. Patient not taking: Reported on 12/10/2015 05/27/15   Ofilia NeasMichael L Clark, PA-C  tretinoin (RETIN-A) 0.05 % cream Apply topically at bedtime. Patient not taking: Reported on 12/10/2015 10/25/14   Ethelda ChickKristi M Smith, MD  valACYclovir (VALTREX) 500 MG tablet Take 1 tablet (500 mg total) by mouth 3 (three) times daily. For viral infection Patient not taking: Reported on 12/10/2015 01/25/15   Ethelda ChickKristi M Smith, MD  zoster vaccine live, PF, (ZOSTAVAX) [id number] UNT/0.65ML injection Inject 19,400 Units into the skin once. Patient not taking: Reported on 12/10/2015  10/25/14   Ethelda Chick, MD    Family History Family History  Problem Relation Age of Onset  . Cancer Mother   . Stroke Mother   . Tremor Mother   . Cancer Father   . Heart disease Father   . Diabetes Brother   . Heart disease Brother   . Neuropathy Brother   . Heart disease Daughter   . Aneurysm Paternal Grandfather   . Arthritis    . Diabetes      1st degree relative  . Hyperlipidemia    . Stroke      Female 1st degree relative <50  . Heart disease      Social History Social History  Substance Use Topics  . Smoking status: Never Smoker  . Smokeless tobacco: Never Used  . Alcohol use No     Allergies   Metoclopramide hcl; Sulfamethoxazole; Amoxicillin; and Penicillins   Review of Systems Review of Systems  Constitutional: Negative for chills and fever.  HENT: Negative for ear pain and sore throat.   Eyes: Negative for pain and visual disturbance.  Respiratory: Negative for cough and shortness of breath.   Cardiovascular: Negative for chest pain and palpitations.  Gastrointestinal: Negative for abdominal pain and vomiting.  Genitourinary:  Negative for dysuria and hematuria.  Musculoskeletal: Positive for arthralgias, back pain and neck pain.  Skin: Positive for wound. Negative for color change and rash.  Neurological: Positive for headaches. Negative for seizures and syncope.  All other systems reviewed and are negative.    Physical Exam Updated Vital Signs BP 133/72   Pulse 83   Temp 98.1 F (36.7 C) (Oral)   Resp 16   Ht 5' 5.5" (1.664 m)   Wt 74.8 kg   SpO2 100%   BMI 27.04 kg/m   Physical Exam  Constitutional: She is oriented to person, place, and time. She appears well-developed and well-nourished. No distress.  HENT:  Head: Normocephalic. Head is with laceration.    Right Ear: Tympanic membrane normal.  Left Ear: Tympanic membrane normal.  Eyes: Conjunctivae are normal.  Neck: Neck supple.  Cardiovascular: Normal rate and regular rhythm.   No murmur heard. Pulmonary/Chest: Effort normal and breath sounds normal. No respiratory distress.  Abdominal: Soft. There is no tenderness.  Musculoskeletal: She exhibits no edema.       Right shoulder: She exhibits normal range of motion and no tenderness.       Right elbow: She exhibits no swelling and no effusion. Tenderness found. Lateral epicondyle and olecranon process tenderness noted.       Right wrist: She exhibits normal range of motion and no tenderness.  Neurological: She is alert and oriented to person, place, and time. No cranial nerve deficit or sensory deficit. She exhibits normal muscle tone. Coordination normal.  Skin: Skin is warm and dry.  Psychiatric: She has a normal mood and affect.  Nursing note and vitals reviewed.    ED Treatments / Results  Labs (all labs ordered are listed, but only abnormal results are displayed) Labs Reviewed  CBC WITH DIFFERENTIAL/PLATELET - Abnormal; Notable for the following:       Result Value   WBC 12.3 (*)    Neutro Abs 10.8 (*)    All other components within normal limits    EKG  EKG  Interpretation  Date/Time:  Tuesday December 10 2015 15:07:17 EST Ventricular Rate:  90 PR Interval:    QRS Duration: 96 QT Interval:  406 QTC Calculation: 497 R  Axis:   52 Text Interpretation:  Sinus rhythm Minimal ST depression, lateral leads Borderline prolonged QT interval When compared with ECG of 06/26/2003, QT has lengthened Confirmed by Preston Fleeting  MD, DAVID ([id number]) on 12/10/2015 3:25:54 PM Also confirmed by Preston Fleeting  MD, DAVID ([id number]), editor [place], [place] [phone])  on 12/10/2015 4:33:49 PM       Radiology Dg Elbow Complete Right  Result Date: 12/10/2015 CLINICAL DATA:  Right elbow abrasions after fall at home. EXAM: RIGHT ELBOW - COMPLETE 3+ VIEW COMPARISON:  None. FINDINGS: There is no evidence of fracture, dislocation, or joint effusion. There is no evidence of arthropathy or other focal bone abnormality. Soft tissues are unremarkable. IMPRESSION: Normal right elbow. Electronically Signed   By: Lupita Raider, M.D.   On: 12/10/2015 16:02   Ct Head Wo Contrast  Result Date: 12/10/2015 CLINICAL DATA:  Fall getting out of bed with head injury. Neck pain. EXAM: CT HEAD WITHOUT CONTRAST CT CERVICAL SPINE WITHOUT CONTRAST TECHNIQUE: Multidetector CT imaging of the head and cervical spine was performed following the standard protocol without intravenous contrast. Multiplanar CT image reconstructions of the cervical spine were also generated. COMPARISON:  Head CT 10/01/2015 FINDINGS: CT HEAD FINDINGS Brain: No evidence of acute infarction, hemorrhage, hydrocephalus, extra-axial collection or mass lesion/mass effect. Vascular: No hyperdense vessel or unexpected calcification. Skull: Right parietal scalp contusion and laceration without opaque foreign body or fracture Sinuses/Orbits: No acute finding. CT CERVICAL SPINE FINDINGS Alignment: No traumatic malalignment. Skull base and vertebrae: Negative for fracture Soft tissues and spinal canal: No prevertebral fluid or swelling. No visible canal  hematoma. Disc levels:  Degenerative changes without evidence of impingement. Upper chest: No acute finding IMPRESSION: 1. No evidence of intracranial or cervical spine injury. 2. Right parietal scalp contusion and laceration without fracture. Electronically Signed   By: Marnee Spring M.D.   On: 12/10/2015 16:50   Ct Cervical Spine Wo Contrast  Result Date: 12/10/2015 CLINICAL DATA:  Fall getting out of bed with head injury. Neck pain. EXAM: CT HEAD WITHOUT CONTRAST CT CERVICAL SPINE WITHOUT CONTRAST TECHNIQUE: Multidetector CT imaging of the head and cervical spine was performed following the standard protocol without intravenous contrast. Multiplanar CT image reconstructions of the cervical spine were also generated. COMPARISON:  Head CT 10/01/2015 FINDINGS: CT HEAD FINDINGS Brain: No evidence of acute infarction, hemorrhage, hydrocephalus, extra-axial collection or mass lesion/mass effect. Vascular: No hyperdense vessel or unexpected calcification. Skull: Right parietal scalp contusion and laceration without opaque foreign body or fracture Sinuses/Orbits: No acute finding. CT CERVICAL SPINE FINDINGS Alignment: No traumatic malalignment. Skull base and vertebrae: Negative for fracture Soft tissues and spinal canal: No prevertebral fluid or swelling. No visible canal hematoma. Disc levels:  Degenerative changes without evidence of impingement. Upper chest: No acute finding IMPRESSION: 1. No evidence of intracranial or cervical spine injury. 2. Right parietal scalp contusion and laceration without fracture. Electronically Signed   By: Marnee Spring M.D.   On: 12/10/2015 16:50    Procedures .Marland KitchenLaceration Repair Date/Time: 12/10/2015 6:11 PM Performed by: Myrtis Ser, Jorene Kaylor Authorized by: Myrtis Ser, Shakila Mak   Consent:    Consent obtained:  Verbal   Consent given by:  Patient Anesthesia (see MAR for exact dosages):    Anesthesia method:  Local infiltration   Local anesthetic:  Lidocaine 2% WITH epi Laceration  details:    Location:  Scalp   Scalp location:  R temporal   Length (cm):  2 Repair type:    Repair type:  Simple Exploration:  Contaminated: no   Treatment:    Area cleansed with:  Saline   Irrigation method:  Syringe   Visualized foreign bodies/material removed: no   Skin repair:    Repair method:  Staples   Number of staples:  2 Approximation:    Approximation:  Close   Vermilion border: well-aligned   Post-procedure details:    Dressing:  Open (no dressing)   (including critical care time)  Medications Ordered in ED Medications  lidocaine-EPINEPHrine (XYLOCAINE W/EPI) 2 %-1:200000 (PF) injection 10 mL (not administered)     Initial Impression / Assessment and Plan / ED Course  I have reviewed the triage vital signs and the nursing notes.  Pertinent labs & imaging results that were available during my care of the patient were reviewed by me and considered in my medical decision making (see chart for details).  Clinical Course    65 year old female with history of fibromyalgia and degenerative joint disease comes today after a fall. She was in her bathroom cleaning herself lost her balance and fell over. She struck the right temporal part of her head against the side of the tub. She sustained small laceration. She did not lose consciousness. She has normal neurologic function on exam vital signs are stable she's afebrile. Some tenderness to the elbow. She is mentating normally alert and oriented 4. Neurologic exam is unremarkable. EKG shows sinus rhythm without any interval abnormalities acute signs of ischemia or arrhythmia. No signs of anemia on laboratory testing. Head CT CT neck are negative for any acute cranial abnormalities or fractures of The bones. Elbow plain film is unremarkable for an acute fracture or malalignment. She is able range her neck fully. Laceration is repaired with staples after local filtration. Please controlled. Patient is safe for discharge home.  Persistent without discharge. Strict return precautions given. Guidance on concussions given.  Final Clinical Impressions(s) / ED Diagnoses   Final diagnoses:  Fall, initial encounter  Laceration of scalp, initial encounter    New Prescriptions New Prescriptions   OXYCODONE (ROXICODONE) 5 MG IMMEDIATE RELEASE TABLET    Take 1 tablet (5 mg total) by mouth every 6 (six) hours as needed for severe pain.     Cherlynn PerchesEric Braylan Faul, MD 12/10/15 1813    Dione Boozeavid Glick, MD 12/10/15 2308

## 2015-12-10 NOTE — ED Notes (Signed)
Patient transported to X-ray 

## 2015-12-10 NOTE — ED Notes (Signed)
Lab call to state that BMP has hemolyzed. Dr. Myrtis SerKatz states we can cancel BMP.

## 2015-12-10 NOTE — ED Notes (Signed)
Pt is getting dressed and ready for discharge. Pt request something for her nausea before she leaves. Nurse has been notified of request at this time.

## 2015-12-10 NOTE — ED Notes (Signed)
ED Provider at bedside. 

## 2016-02-06 ENCOUNTER — Other Ambulatory Visit: Payer: Self-pay | Admitting: Physician Assistant

## 2016-02-07 ENCOUNTER — Ambulatory Visit (INDEPENDENT_AMBULATORY_CARE_PROVIDER_SITE_OTHER): Payer: Federal, State, Local not specified - PPO | Admitting: Neurology

## 2016-02-07 ENCOUNTER — Encounter: Payer: Self-pay | Admitting: Neurology

## 2016-02-07 VITALS — BP 138/83 | HR 98 | Ht 65.5 in | Wt 168.0 lb

## 2016-02-07 DIAGNOSIS — H8110 Benign paroxysmal vertigo, unspecified ear: Secondary | ICD-10-CM

## 2016-02-07 DIAGNOSIS — R42 Dizziness and giddiness: Secondary | ICD-10-CM

## 2016-02-07 DIAGNOSIS — R269 Unspecified abnormalities of gait and mobility: Secondary | ICD-10-CM | POA: Diagnosis not present

## 2016-02-07 DIAGNOSIS — G118 Other hereditary ataxias: Secondary | ICD-10-CM

## 2016-02-07 DIAGNOSIS — W19XXXA Unspecified fall, initial encounter: Secondary | ICD-10-CM

## 2016-02-07 NOTE — Progress Notes (Signed)
Faxed [employer] requisition for test code [phone] (epidsodic ataxia evalutation) Dx: R42, H81.10, G11.8, R26.9, W19.xxxA Fax: [phone]. Received confirmation.

## 2016-02-07 NOTE — Progress Notes (Addendum)
[facility]   Provider:  Dr [name] Referring Provider: [name] MD, [name] PA Primary Care Physician:  [name] MD  CC:  Gait abnormality  Addendum 02/10/2016: Received a message that patient decline Vestibular therapy saying she no longer has vertigo which is exactly the opposite of what she told me at appointment 1/19 (see below) when she said she was having daily dizziness and vertigo. She also has imbalance and I asked her to see PT for balance exercises as well but she apparently has declined. If she is non-compliant and still c/o same symptoms she should be referred back to her pcp.   Interval history 02/07/2016: Patient initially seen in January 2017 for gait abnormality (see below) as she felt she had a staggering gait. Imaging was negative for etiology including MRI brain, cervical spine and neurologic exam was normal. MRi of the cervical spine showed arthritic changes but no cord abnormality that would be a cause for ataxia.  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 Patient returns today after concussion. CT of the head and cervical spine showed parietal scalp laceration but no acute intracranial or cervical spine injury. Her review of emergency room notes 12/10/2015, she lost her balance and fell at home. She struck the right parietal area on a piece of furniture and several abrasions to the right elbow. No loss of consciousness. She had a laceration superior to the right ear and minor abrasions to the right elbow. She doesn't know why she fell except she had a "vertigo episode" she had some room spinning before she fell and hit her head. She has been dizzy since the concussion. They performed the [facility] which was positive. She saw her ENT and she was diagnosed with benign positional vertigo. She saw ENT yesterday. Since her concussion she is having "off", unstable, dizzy, she has continued memory problems and was diagnosed with Fibromyalgia.  She has dizziness and vertigo every single day multiple times a day worse on turning head and lasts minutes, she has nausea and vomiting. She has chronic pain and neck tension. She still has episodic gait abnormalities, she will briefly hit the wall due to imbalance, she feels her leg won;t work.    HPI Initial visit 02/06/2015:  Mandy Diaz is a 66 y.o. female here as a referral from Dr. Katrinka Blazing for staggering gait. Past medical HTN, hld, migraine,depression,anxiety,fibromyalga. She is here with her husband who provides information. She was diagnosed with Fibromyalgia 2 years ago, she has a lot of chronic pain. She noticed within the last 9 months that she has a tremor and notices it with action. Occasionally she will have tics sometimes but not often. Her walking will all of a sudden be abnormal, she will be up against the right wall like a veering car. She feels like a "ping pong ball" and she bounces on the hallways and then the other side. But she has always been clumsy all her life. When she goes up steps she has a tendency to fall backwards. It is worsening. It can happen anytime. Lasts briefly 5-10 minutes. She denies weakness, dizziness, lightheadedness,chest pain, confusion during the episodes. Not medication related. Can be anytime. She has a lot of headaches. It may happen with headaches, she has chronic headaches and she reports she has been diagnosed with every type of headache but she can have these episodes without the headaches. But she is not dizzy or vertigionous with the abnormal gait. She has some  sensory changes on the lips and in the 2 fingers. She has constant neck pain. She has sciatica and LBP. She has neck pain. Mother had a tremor.   Reviewed notes, labs and imaging from outside physicians, which showed:   Per notes reviewed in epic: has been staggering a lot lately; no falls; cannot tell that about to fall towards a certain area. Has almost fallen down the steps. With steps,  will stagger with steps. Unpredictable. No history of vertigo. Not dizzy. Was getting into car and gets unsteady getting into the car. Using a cart in grocery store is difficult. (Daughter has reported to provider in confidence that patient over uses variety of medications; daughter is convinced that patient's fatigue and staggering is due to overmedication use).   MRi of the brain in 08/2012 was normal, personally reviewed images  Normal/negative labs: HIV, hep c, cbc, cmp, tsh, B12  Review of Systems: Patient complains of symptoms per HPI as well as the following symptoms: weight gain,atigue, palpitations, shortness of breath, snoring, spinning, trouble swallowing,aching muscles,incontinence. Pertinent negatives per HPI. All others negative.   Social History   Social History  . Marital status: Married    Spouse name: Prescott ParmaHarold "Lee"  . Number of children: 2  . Years of education: 12.5   Occupational History  . Not on file.   Social History Main Topics  . Smoking status: Never Smoker  . Smokeless tobacco: Never Used  . Alcohol use No  . Drug use: No  . Sexual activity: Yes    Birth control/ protection: None   Other Topics Concern  . Not on file   Social History Narrative   Lives with husband Daivd CouncilHarold Owen   Caffeine use: Drinks coffee every morning (1 cup)      Regular exercise - NO          Family History  Problem Relation Age of Onset  . Cancer Mother   . Stroke Mother   . Tremor Mother   . Cancer Father   . Heart disease Father   . Diabetes Brother   . Heart disease Brother   . Neuropathy Brother   . Heart disease Daughter   . Aneurysm Paternal Grandfather   . Arthritis    . Diabetes      1st degree relative  . Hyperlipidemia    . Stroke      Female 1st degree relative <50  . Heart disease      Past Medical History:  Diagnosis Date  . Anxiety   . Depression   . Fibromyalgia 12/2012   s/p rheumatology consultation to confirm diagnosis; no  evidence of autoimmune process.  Marland Kitchen. GERD (gastroesophageal reflux disease)   . Hypertension   . IBS (irritable bowel syndrome)   . Menopause   . Migraine   . Osteoarthritis   . Thyroid disease   . Urinary incontinence     Past Surgical History:  Procedure Laterality Date  . CHOLECYSTECTOMY      Current Outpatient Prescriptions  Medication Sig Dispense Refill  . atenolol (TENORMIN) 25 MG tablet Take 1 tablet (25 mg total) by mouth daily. 90 tablet 1  . buPROPion (WELLBUTRIN XL) 150 MG 24 hr tablet Take 1 tablet (150 mg total) by mouth daily. 90 tablet 1  . butalbital-acetaminophen-caffeine (FIORICET, ESGIC) 50-325-40 MG tablet TAKE ONE CAPSULE BY MOUTH EVERY 6 HOURS AS NEEDED FOR HEADACHES 30 tablet 0  . Cholecalciferol 2000 units CAPS Take 4,000 Units by mouth daily.    .Marland Kitchen  clidinium-chlordiazePOXIDE (LIBRAX) 5-2.5 MG capsule TAKE ONE CAPSULE BY MOUTH 3 TIMES A DAY BEFORE MEALS (Patient taking differently: TAKE ONE CAPSULE BY MOUTH DAILY AS NEEDED FOR STOMACH) 45 capsule 0  . clonazePAM (KLONOPIN) 0.5 MG tablet Take 1 tablet (0.5 mg total) by mouth daily as needed for anxiety. 30 tablet 1  . cyanocobalamin (,VITAMIN B-12,) 1000 MCG/ML injection Inject 1 mL (1,000 mcg total) into the muscle every 14 (fourteen) days. (Patient taking differently: Inject 1,000 mcg into the muscle every Friday. ) 6 mL 3  . cyclobenzaprine (FLEXERIL) 5 MG tablet Take 1 tablet (5 mg total) by mouth 3 (three) times daily as needed for muscle spasms. 1 morning, mid-afternoon and bedtime for muscle spasm 30 tablet 2  . DULoxetine (CYMBALTA) 60 MG capsule Take 2 capsules (120 mg total) by mouth daily. 180 capsule 1  . estradiol (ESTRACE) 1 MG tablet TAKE 1 TABLET (1MG  TOTAL) BY MOUTH DAILY 90 tablet 0  . Magnesium Malate POWD Take 3,900 mg by mouth daily. TAKES 3 x 1300MG  CAPSULES ONCE DAILY    . meclizine (ANTIVERT) 25 MG tablet Take 1 tablet (25 mg total) by mouth 3 (three) times daily as needed for dizziness. 30  tablet 0  . meloxicam (MOBIC) 15 MG tablet Take 1 tablet (15 mg total) by mouth daily. (Patient taking differently: Take 15 mg by mouth daily as needed for pain. ) 30 tablet 0  . NON FORMULARY Take 150 mg by mouth every morning. PROGENOLONE (for memory and hormonal balance).    . NON FORMULARY Take 2 capsules by mouth daily. ALL ADRENAL RAW.     . pantoprazole (PROTONIX) 40 MG tablet Take 1 tablet (40 mg total) by mouth daily. 90 tablet 3  . progesterone (PROMETRIUM) 200 MG capsule Take 200 mg by mouth daily.     . promethazine (PHENERGAN) 12.5 MG tablet TAKE 1 TABLET (12.5 MG TOTAL) BY MOUTH EVERY 8 (EIGHT) HOURS AS NEEDED FOR NAUSEA OR VOMITING. 30 tablet 0  . thyroid (ARMOUR) 65 MG tablet Take 65 mg by mouth daily.    . traMADol (ULTRAM) 50 MG tablet TAKE 1 TABLET BY MOUTH EVERY 6 HOURS AS NEEDED (Patient taking differently: TAKE 1 TABLET BY MOUTH EVERY 6 HOURS AS NEEDED FOR PAIN) 30 tablet 0  . traZODone (DESYREL) 50 MG tablet Take 1 tablet by mouth at bedtime as needed for sleep. 90 tablet 1  . valACYclovir (VALTREX) 500 MG tablet Take 1 tablet (500 mg total) by mouth 3 (three) times daily. For viral infection 90 tablet 1   No current facility-administered medications for this visit.     Allergies as of 02/07/2016 - Review Complete 02/07/2016  Allergen Reaction Noted  . Metoclopramide hcl Other (See Comments) 09/09/2006  . Sulfamethoxazole Other (See Comments) 04/21/2006  . Amoxicillin Itching and Rash 04/21/2006  . Penicillins Rash 09/09/2006    Vitals: BP 138/83 (BP Location: Right Arm, Patient Position: Sitting, Cuff Size: Normal)   Pulse 98   Ht 5' 5.5" (1.664 m)   Wt 168 lb (76.2 kg)   BMI 27.53 kg/m  Last Weight:  Wt Readings from Last 1 Encounters:  02/07/16 168 lb (76.2 kg)   Last Height:   Ht Readings from Last 1 Encounters:  02/07/16 5' 5.5" (1.664 m)     Neuro: Detailed Neurologic Exam  Speech:    Speech is normal; fluent and spontaneous with normal  comprehension.  Cognition:    The patient is oriented to person, place, and time;  recent and remote memory intact;     language fluent;     normal attention, concentration,     fund of knowledge Cranial Nerves:    The pupils are equal, round, and reactive to light. The fundi are normal and spontaneous venous pulsations are present. Visual fields are full to finger confrontation. Extraocular movements are intact. Trigeminal sensation is intact and the muscles of mastication are normal. The face is symmetric. The palate elevates in the midline. Hearing intact. Voice is normal. Shoulder shrug is normal. The tongue has normal motion without fasciculations.   Coordination:    Normal finger to nose and heel to shin. Normal rapid alternating movements.   Gait:    Cautious, no ataxia. Can walk on heels and toes and tandem but with imbalance  Motor Observation:    No asymmetry, no atrophy, mild postural high freq low amplitude tremor. Tone:    Normal muscle tone.    Posture:    Posture is normal. normal erect    Strength:    Strength is V/V in the upper and lower limbs.      Sensation: Mild decr sensory at the toes pp and temp, normal vibration and proprioception     Reflex Exam:  DTR's:    Deep tendon reflexes in the upper and lower extremities are brisk bilaterally.   Toes:    The toes are equivocal bilaterally.   Clonus:    Clonus is absent.    Assessment/Plan:  66 year old with many chronic complaints who was evaluated last year for reported episodic gait abnormality with no other associated symptoms, MRI of the brain and cervical spine did not show any etiology for symptoms and neuro exam was unremarkable. Exam is unremarkable except for some decreased pin prick and temp distally in the feet and imbalance on heel/toe and tandem. I did not notice any gait abnormality on exam. Notes in chart indicated she may be staggering due to reported overmedication use by a family  member. She also reported a tremor; Tremor may be essential tremor, mother also had a tremor. She had a recent concussion with worsening vertigo.   - Vertigo and BPPV - Vestibular therapy - Imbalance- Physical therapy would be helpful as well for balance exercises. She does not exercise or do yoga, advised yoga or silver sneakers. Maybe PT can work on her vertigo and imbalance as well.  -The only other explanation for her episodes of imbalance and gait disorder is episodic ataxia(she says sometimes she "ping pongs" off the walls episodically) . Offered her [name] testing for these rare disorders which is unlikely.  I think she just needs physical therapy and balance exercises which I recommended in the past. - Patient also with chronic headaches, she has been "diagnosed with every type of headache" over the years. I'm happy to see her for headaches but likely need another appointment just to focus on her headaches.  Cc: [name] MD, [facility]  Naomie Dean, MD  [facility] [address] [place], [place] [address]  Phone [phone] Fax [phone]  A total of 30 minutes was spent face-to-face with this patient. Over half this time was spent on counseling patient on the vertigo, imbalance diagnosis and different diagnostic and therapeutic options available.

## 2016-02-07 NOTE — Patient Instructions (Signed)
Remember to drink plenty of fluid, eat healthy meals and do not skip any meals. Try to eat protein with a every meal and eat a healthy snack such as fruit or nuts in between meals. Try to keep a regular sleep-wake schedule and try to exercise daily, particularly in the form of walking, 20-30 minutes a day, if you can.   As far as diagnostic testing: Vestibular therapy and [facility] testing for "Episodic Ataxia"  I would like to see you back as needed, sooner if we need to. Please call us with any interim questions, concerns, problems, updates or refill requests.   Our phone number is [phone]. We also have an after hours call service for urgent matters and there is a physician on-call for urgent questions. For any emergencies you know to call 911 or go to the nearest emergency room

## 2016-02-24 ENCOUNTER — Telehealth: Payer: Self-pay | Admitting: Neurology

## 2016-02-24 NOTE — Telephone Encounter (Signed)
[employer] and neither the lab nor our office have received PA request for ordered labs. Will need to follow-up w/ patient and/or her insurance co.

## 2016-02-24 NOTE — Telephone Encounter (Signed)
Patients husband called in reference to a kit they received for genetic testing.  Per husband he said it is needing a prior authorization.  Please call

## 2016-02-25 NOTE — Telephone Encounter (Signed)
[employer]ed [employer] T # [phone]. Was told that pt has federal plan coverage and was transferred to benefits. 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 Notified pt via TC. She agreed to call insurance co to determine her out-of-pocket expense for ordered labs.

## 2016-02-25 NOTE — Telephone Encounter (Addendum)
Returned call and spoke to patient. York SpanielSaid that her husband spoke to their insurance company [employer]) yesterday and was told that [facility] required a pre-approval for coverage. Will call [employer] and attempt PA later today. Will call pt back w/ response.

## 2016-03-10 ENCOUNTER — Other Ambulatory Visit: Payer: Self-pay | Admitting: Family Medicine

## 2016-03-10 NOTE — Telephone Encounter (Signed)
Please schedule an appt for the patient within 3 months - it looks looks like her PCP listed is not in our practice -- if this is correct she needs to schedule with that provider if we are her PCP she needs to schedule with her PCP at this practice.

## 2016-04-13 ENCOUNTER — Encounter: Payer: Self-pay | Admitting: Neurology

## 2016-05-05 ENCOUNTER — Other Ambulatory Visit: Payer: Self-pay | Admitting: Family Medicine

## 2016-05-09 ENCOUNTER — Other Ambulatory Visit: Payer: Self-pay | Admitting: Physician Assistant

## 2016-05-23 IMAGING — MR MR LUMBAR SPINE W/O CM
4 of 5 series · 18 of 48 positions shown · non-contrast
Comparison: None.

CLINICAL DATA: Low back pain, left upper leg pain and weakness.

EXAM:
MRI LUMBAR SPINE WITHOUT CONTRAST
TECHNIQUE: Multiplanar, multisequence MR imaging of the lumbar spine was
performed. No intravenous contrast was administered.

[Series 5: T2 · sagittal · 4.0mm · 0.73mm/px · 6 of 15 slices shown (1 of 2)]
[im 1/15]
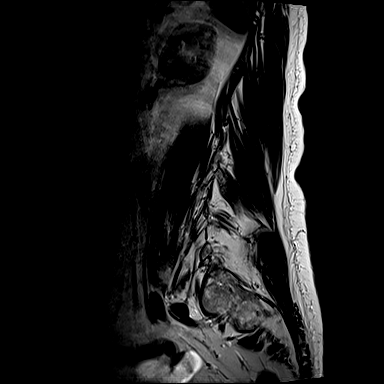
[im 3/15]
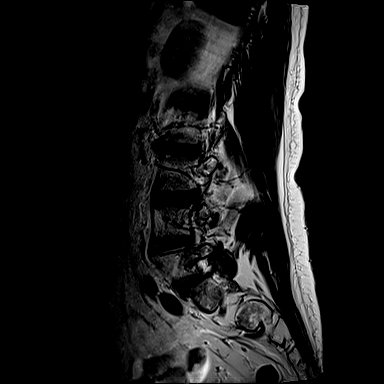
[im 6/15]
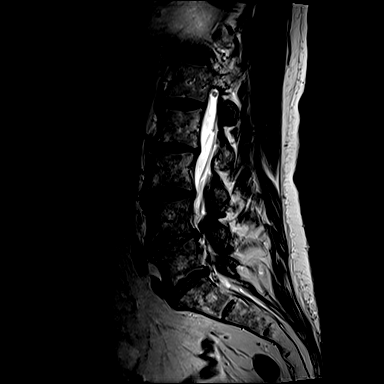
[im 9/15]
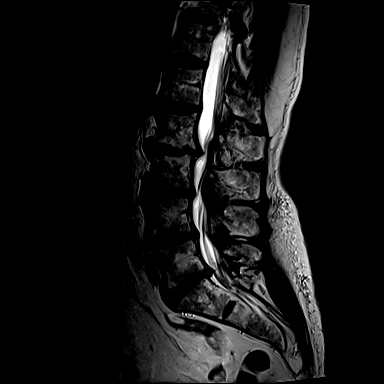
[im 12/15]
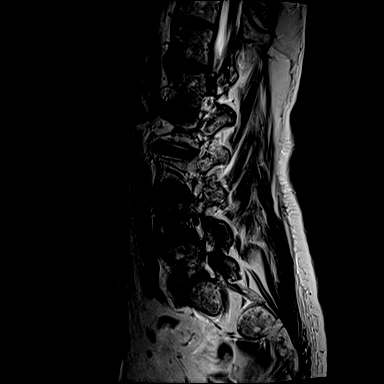
[im 15/15]
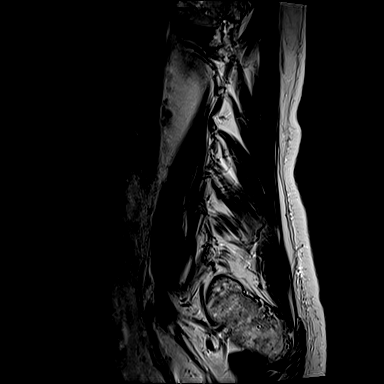

[Series 7: T1 · sagittal · 4.0mm · 0.73mm/px · 3 of 15 slices shown (1 of 2)]
[im 3/15]
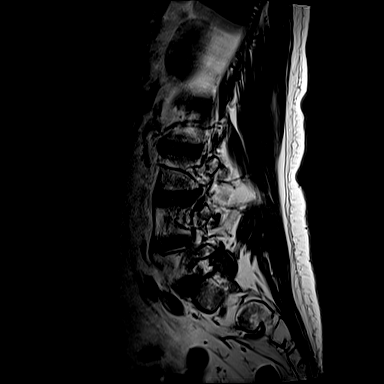
[im 8/15]
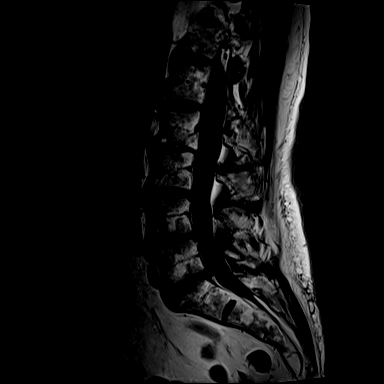
[im 12/15]
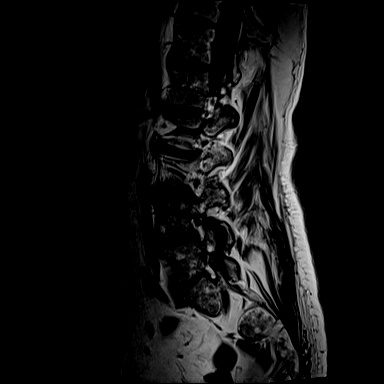

[Series 10: T1 · axial · 4.0mm · 0.28mm/px · z∈[-32,+102]mm · 3 of 31 slices shown (2 of 2)]
[im 5/31]
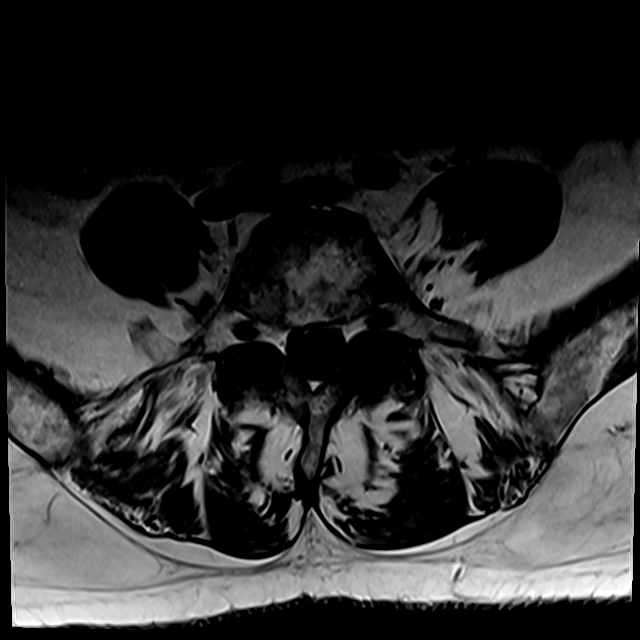
[im 17/31]
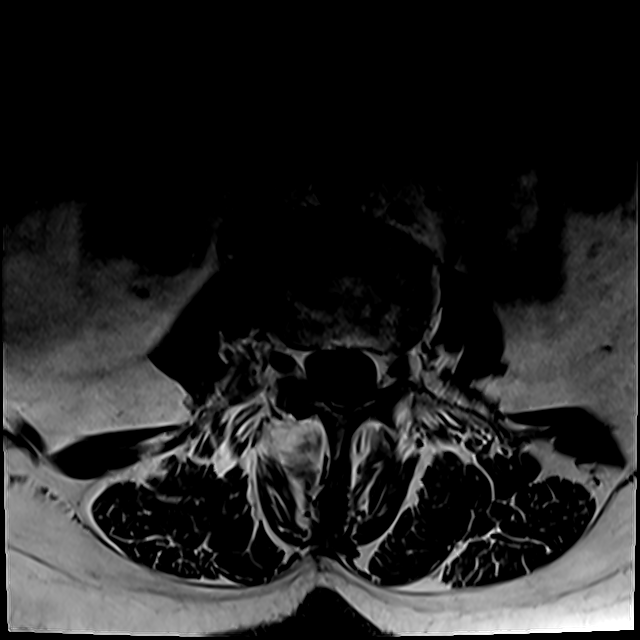
[im 26/31]
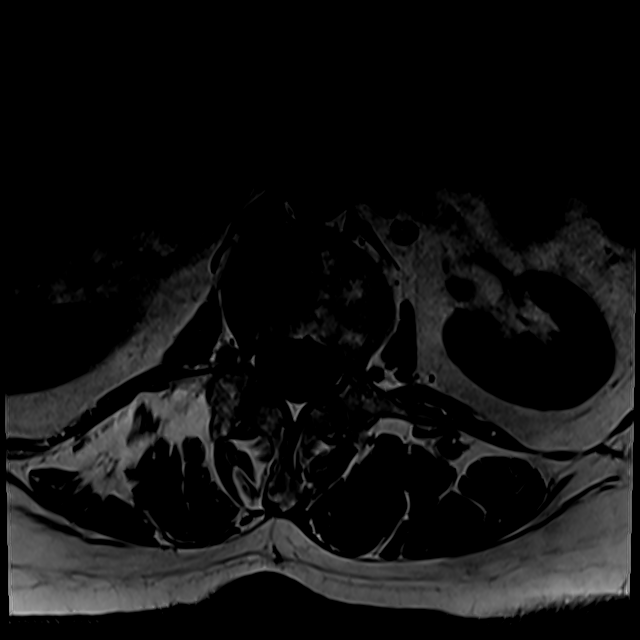

[Series 13: T2 · axial · 4.0mm · 0.28mm/px · z∈[-52,+102]mm · 6 of 31 slices shown (2 of 2)]
[im 1/31]
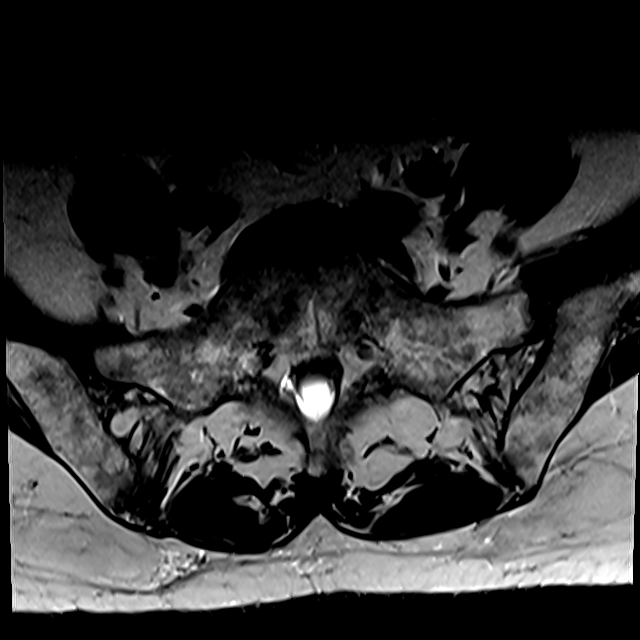
[im 5/31]
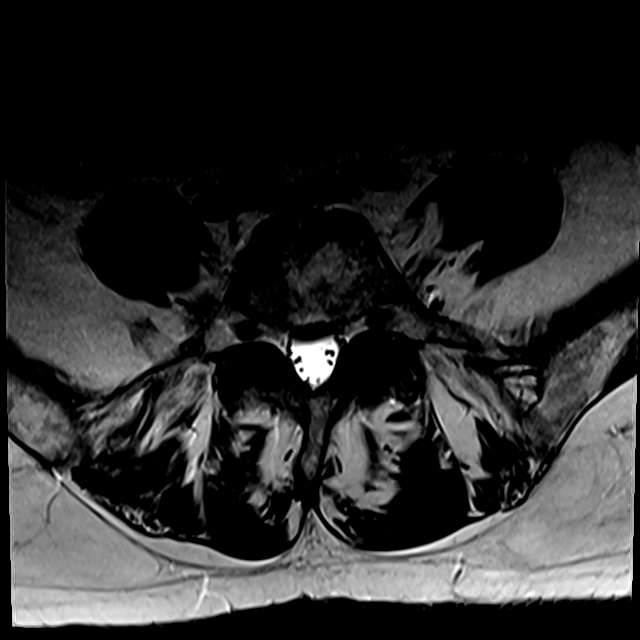
[im 10/31]
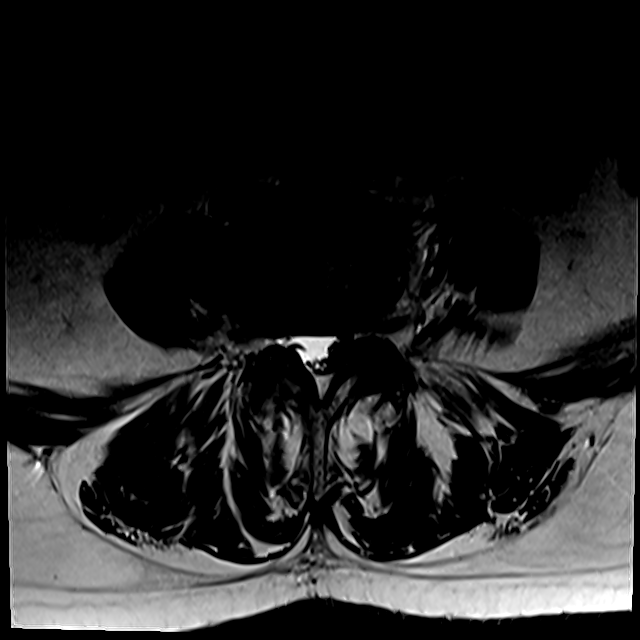
[im 14/31]
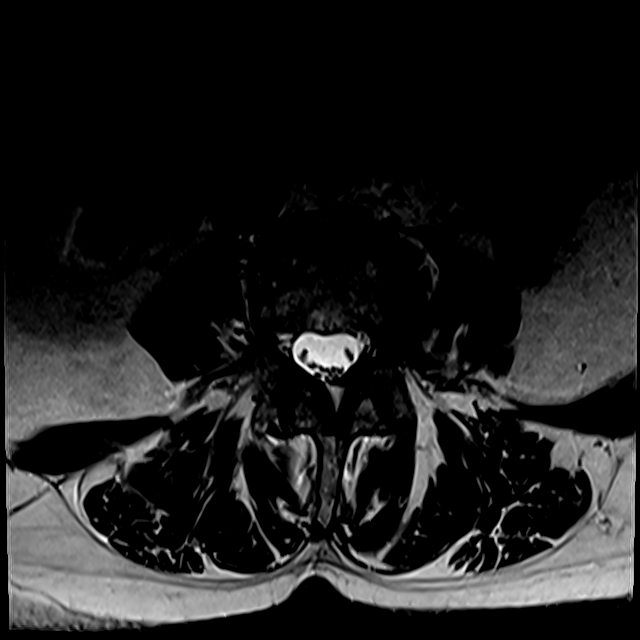
[im 17/31]
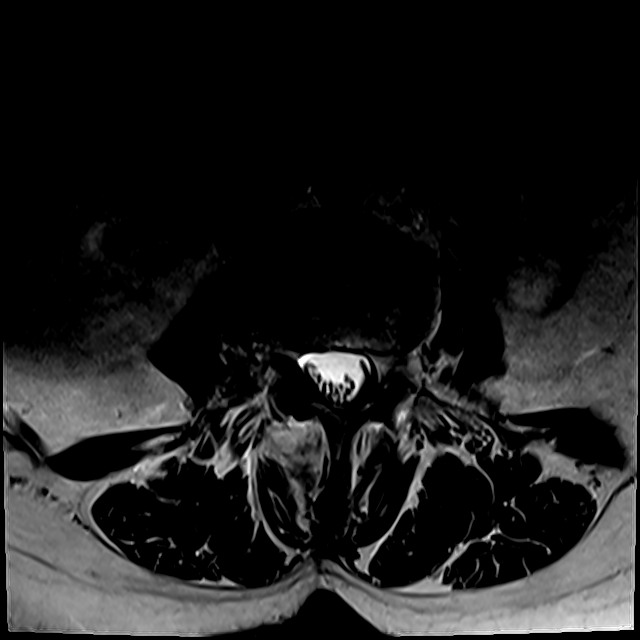
[im 26/31]
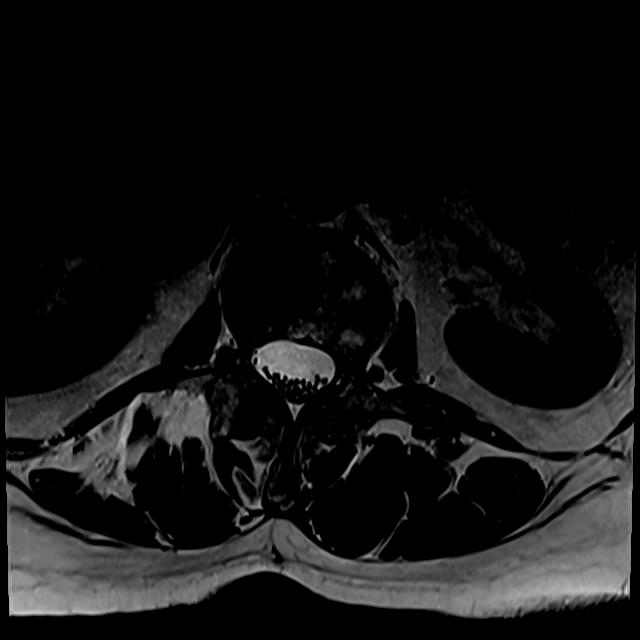

[18 of 48 positions shown; findings below may reference images not displayed]

FINDINGS: The vertebral bodies of the lumbar spine are normal in size. The
vertebral bodies of the lumbar spine are normal in alignment. There
is normal bone marrow signal demonstrated throughout the vertebra.
There is degenerative disc disease at L2-3 with disc height loss.
There is mild degenerative disc disease at L4-5 and L5-S1.

The spinal cord is normal in signal and contour. The cord terminates
normally at L1 . The nerve roots of the cauda equina and the filum
terminale are normal.

The visualized portions of the SI joints are unremarkable.

The imaged intra-abdominal contents are unremarkable.

T12-L1: No significant disc bulge. No evidence of neural foraminal
stenosis. No central canal stenosis.

L1-L2: No significant disc bulge. No evidence of neural foraminal
stenosis. No central canal stenosis.

L2-L3: Mild broad-based disc bulge. Mild right paracentral disc
protrusion abutting the right intraspinal L3 nerve root. No evidence
of neural foraminal stenosis. No central canal stenosis.

L3-L4: Mild broad-based disc bulge. Mild bilateral facet
arthropathy. No evidence of neural foraminal stenosis. No central
canal stenosis.

L4-L5: Mild broad-based disc bulge. Severe left and mild right facet
arthropathy. Bilateral lateral recess stenosis, left greater than
right. Mild spinal stenosis. No evidence of neural foraminal
stenosis.

L5-S1: Right paracentral disc protrusion abutting the right
intraspinal S1 nerve root. Mild bilateral facet arthropathy. No
evidence of neural foraminal stenosis. No central canal stenosis.
IMPRESSION: 1. At L5-S1 there is a right paracentral disc protrusion abutting
the right intraspinal S1 nerve root. Mild bilateral facet
arthropathy.
2. At L4-5 there is a mild broad-based disc bulge. Severe left and
mild right facet arthropathy. Bilateral lateral recess stenosis,
left greater than right. Mild spinal stenosis.
3. At L2-3 there is a mild broad-based disc bulge. Mild right
paracentral disc protrusion abutting the right intraspinal L3 nerve
root.

## 2016-06-04 ENCOUNTER — Telehealth: Payer: Self-pay | Admitting: Neurology

## 2016-06-04 NOTE — Telephone Encounter (Signed)
I think PT for balance would be next.

## 2016-06-04 NOTE — Telephone Encounter (Signed)
Called Mandy Diaz again and spoke to Mandy Diaz who re-faxed Episodic Ataxia Eval results, which were received. All DNA sequencing was negative. "The panel of tests did not identify any variants associated w/ episodic ataxia, however, the diagnosis cannot be completely ruled out d/t variants not detected by this assay or variants in another gene." Sent to med records for scanning, copy to Mandy Diaz for review.

## 2016-06-04 NOTE — Telephone Encounter (Signed)
Pt said she has not heard back from gene testing a few months back. Please call

## 2016-06-04 NOTE — Telephone Encounter (Signed)
Called pt w/ results. Verbalized understanding and appreciation for call. Reports that she had another fall on Monday, she tripped down some steps and dislocated her elbow. Pt was last seen in January for vertigo and ataxia and was advised to follow-up as needed. She feels that vestibular therapy was helpful with the dizziness. However, she is still having difficulty w/ balance.

## 2016-06-04 NOTE — Telephone Encounter (Signed)
Called Mandy Diaz and spoke to [name] who said that results are available and agreed to fax them to [phone] (pod 4).

## 2016-06-04 NOTE — Telephone Encounter (Signed)
Yes please, let them know the workup was negative thanks

## 2016-06-08 NOTE — Telephone Encounter (Signed)
Returned call to pt. Let her know that lab results were mailed to her as requested. Offered referral to PT for balance. However, pt says that she has an appt this Fri to f/u on her elbow. Reports that she may need surgery and/or PT for it. Agreed to call back w/ update after Friday's appt. Voiced appreciation for call.

## 2016-06-09 ENCOUNTER — Other Ambulatory Visit: Payer: Self-pay | Admitting: Family Medicine

## 2016-06-17 ENCOUNTER — Telehealth: Payer: Self-pay | Admitting: Physician Assistant

## 2016-06-17 MED ORDER — PROGESTERONE MICRONIZED 100 MG PO CAPS: 100.0000 mg | ORAL_CAPSULE | Freq: Every day | ORAL | 0 refills | Status: DC

## 2016-06-17 NOTE — Telephone Encounter (Signed)
Call -- I approved estradiol yet only for one month.  Pt is overdue for six month follow-up; please schedule CPE with me in upcoming month. Also, it appears that she has established with [facility] Ren[facility]; please confirm this. If so, she needs to get further refills from Dr. Renne CriglerPharr.

## 2016-06-24 NOTE — Telephone Encounter (Signed)
Pt would like you to give her a call, she wanted to verify the MG on her estradiol it went from 1mg  to .5mg .  Pt contact number is [phone]

## 2016-06-30 ENCOUNTER — Other Ambulatory Visit: Payer: Self-pay | Admitting: Physician Assistant

## 2016-06-30 ENCOUNTER — Other Ambulatory Visit: Payer: Self-pay | Admitting: Family Medicine

## 2016-06-30 NOTE — Telephone Encounter (Signed)
Call --- I denied refill of estradiol/Estrace as I recommended follow-up on 06/17/16 within upcoming month.  I also see that patient has established with Dr[facility] Renne [facility]Pharr of [facility].  If she has established care with Dr. Renne CriglerPharr, she needs to receive refills from him. Also, I decreased the dose of Estradiol to 0.5mg  as current guidelines do not recommend hormone replacement therapy for females over age 360.  Patient will be 66 years old next month and needs to wean off of hormone replacement therapy in the upcoming three months.  Please clarify her PCP.

## 2016-07-01 ENCOUNTER — Telehealth: Payer: Self-pay | Admitting: Family Medicine

## 2016-07-01 NOTE — Telephone Encounter (Signed)
SPOKE WITH PT HER PROVIDER IS DR PHARR I ADVISED HER THAT RX WAS DENIED AND RX HAS BEEN CHANGED FROM 5 MG TO 0.5 MG DUE TO AGE PT UNDERSTANDS AND WILL CALL HIS OFFICE FOR MED REFILL  

## 2016-07-01 NOTE — Telephone Encounter (Signed)
SPOKE WITH PT HER PROVIDER IS DR PHARR I ADVISED HER THAT RX WAS DENIED AND RX HAS BEEN CHANGED FROM 5 MG TO 0.5 MG DUE TO AGE PT UNDERSTANDS AND WILL CALL HIS OFFICE FOR MED REFILL

## 2016-07-31 ENCOUNTER — Other Ambulatory Visit: Payer: Self-pay | Admitting: Family Medicine

## 2016-09-16 ENCOUNTER — Ambulatory Visit (INDEPENDENT_AMBULATORY_CARE_PROVIDER_SITE_OTHER): Payer: Federal, State, Local not specified - PPO | Admitting: Family Medicine

## 2016-09-16 ENCOUNTER — Encounter: Payer: Self-pay | Admitting: Family Medicine

## 2016-09-16 VITALS — BP 141/82 | HR 105 | Temp 98.0°F | Resp 16 | Ht 65.16 in | Wt 171.0 lb

## 2016-09-16 DIAGNOSIS — F5104 Psychophysiologic insomnia: Secondary | ICD-10-CM | POA: Diagnosis not present

## 2016-09-16 DIAGNOSIS — Z23 Encounter for immunization: Secondary | ICD-10-CM | POA: Diagnosis not present

## 2016-09-16 DIAGNOSIS — G43709 Chronic migraine without aura, not intractable, without status migrainosus: Secondary | ICD-10-CM

## 2016-09-16 DIAGNOSIS — F341 Dysthymic disorder: Secondary | ICD-10-CM

## 2016-09-16 DIAGNOSIS — R002 Palpitations: Secondary | ICD-10-CM | POA: Diagnosis not present

## 2016-09-16 DIAGNOSIS — I10 Essential (primary) hypertension: Secondary | ICD-10-CM | POA: Diagnosis not present

## 2016-09-16 DIAGNOSIS — K580 Irritable bowel syndrome with diarrhea: Secondary | ICD-10-CM

## 2016-09-16 DIAGNOSIS — M899 Disorder of bone, unspecified: Secondary | ICD-10-CM

## 2016-09-16 DIAGNOSIS — N3941 Urge incontinence: Secondary | ICD-10-CM | POA: Diagnosis not present

## 2016-09-16 DIAGNOSIS — M797 Fibromyalgia: Secondary | ICD-10-CM

## 2016-09-16 DIAGNOSIS — R296 Repeated falls: Secondary | ICD-10-CM

## 2016-09-16 DIAGNOSIS — R42 Dizziness and giddiness: Secondary | ICD-10-CM | POA: Diagnosis not present

## 2016-09-16 DIAGNOSIS — Z1231 Encounter for screening mammogram for malignant neoplasm of breast: Secondary | ICD-10-CM | POA: Diagnosis not present

## 2016-09-16 DIAGNOSIS — M949 Disorder of cartilage, unspecified: Secondary | ICD-10-CM

## 2016-09-16 DIAGNOSIS — Z1239 Encounter for other screening for malignant neoplasm of breast: Secondary | ICD-10-CM

## 2016-09-16 DIAGNOSIS — H8113 Benign paroxysmal vertigo, bilateral: Secondary | ICD-10-CM

## 2016-09-16 MED ORDER — OMEPRAZOLE 20 MG PO CPDR: 20.0000 mg | DELAYED_RELEASE_CAPSULE | Freq: Every day | ORAL | 3 refills | Status: DC

## 2016-09-16 NOTE — Patient Instructions (Addendum)
  Call Dr. [name] for pelvic pain and vaginal discharge   IF you received an x-ray today, you will receive an invoice from [facility]. Please contact [facility] at [phone] with questions or concerns regarding your invoice.   IF you received labwork today, you will receive an invoice from [facility]. Please contact [facility] at [phone] with questions or concerns regarding your invoice.   Our billing staff will not be able to assist you with questions regarding bills from these companies.  You will be contacted with the lab results as soon as they are available. The fastest way to get your results is to activate your My Chart account. Instructions are located on the last page of this paperwork. If you have not heard from [place] regarding the results in 2 weeks, please contact this office.
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
## 2016-09-17 ENCOUNTER — Other Ambulatory Visit: Payer: Self-pay | Admitting: Family Medicine

## 2016-09-17 LAB — CBC WITH DIFFERENTIAL/PLATELET
BASOS: 0 %
Basophils Absolute: 0 10*3/uL (ref 0.0–0.2)
EOS (ABSOLUTE): 0 10*3/uL (ref 0.0–0.4)
EOS: 0 %
HEMATOCRIT: 38.9 % (ref 34.0–46.6)
HEMOGLOBIN: 13.5 g/dL (ref 11.1–15.9)
IMMATURE GRANULOCYTES: 0 %
Immature Grans (Abs): 0 10*3/uL (ref 0.0–0.1)
LYMPHS ABS: 2.3 10*3/uL (ref 0.7–3.1)
Lymphs: 26 %
MCH: 28.7 pg (ref 26.6–33.0)
MCHC: 34.7 g/dL (ref 31.5–35.7)
MCV: 83 fL (ref 79–97)
MONOCYTES: 4 %
Monocytes Absolute: 0.4 10*3/uL (ref 0.1–0.9)
Neutrophils Absolute: 6.3 10*3/uL (ref 1.4–7.0)
Neutrophils: 70 %
Platelets: 344 10*3/uL (ref 150–379)
RBC: 4.71 x10E6/uL (ref 3.77–5.28)
RDW: 13.6 % (ref 12.3–15.4)
WBC: 9 10*3/uL (ref 3.4–10.8)

## 2016-09-17 LAB — URINALYSIS, ROUTINE W REFLEX MICROSCOPIC
BILIRUBIN UA: NEGATIVE
GLUCOSE, UA: NEGATIVE
KETONES UA: NEGATIVE
Leukocytes, UA: NEGATIVE
Nitrite, UA: NEGATIVE
PROTEIN UA: NEGATIVE
RBC UA: NEGATIVE
SPEC GRAV UA: 1.019 (ref 1.005–1.030)
UUROB: 0.2 mg/dL (ref 0.2–1.0)
pH, UA: 6.5 (ref 5.0–7.5)

## 2016-09-17 LAB — COMPREHENSIVE METABOLIC PANEL
A/G RATIO: 1.9 (ref 1.2–2.2)
ALBUMIN: 4.5 g/dL (ref 3.6–4.8)
ALT: 24 IU/L (ref 0–32)
AST: 29 IU/L (ref 0–40)
Alkaline Phosphatase: 71 IU/L (ref 39–117)
BUN / CREAT RATIO: 10 — AB (ref 12–28)
BUN: 8 mg/dL (ref 8–27)
Bilirubin Total: 0.3 mg/dL (ref 0.0–1.2)
CALCIUM: 9.5 mg/dL (ref 8.7–10.3)
CO2: 23 mmol/L (ref 20–29)
CREATININE: 0.78 mg/dL (ref 0.57–1.00)
Chloride: 103 mmol/L (ref 96–106)
GFR calc Af Amer: 92 mL/min/{1.73_m2} (ref >59)
GFR, EST NON AFRICAN AMERICAN: 80 mL/min/{1.73_m2} (ref >59)
GLOBULIN, TOTAL: 2.4 g/dL (ref 1.5–4.5)
Glucose: 89 mg/dL (ref 65–99)
Potassium: 4 mmol/L (ref 3.5–5.2)
SODIUM: 141 mmol/L (ref 134–144)
Total Protein: 6.9 g/dL (ref 6.0–8.5)

## 2016-09-17 LAB — TSH: TSH: 1.17 u[IU]/mL (ref 0.450–4.500)

## 2016-09-17 LAB — VITAMIN B12: Vitamin B-12: 1400 pg/mL — ABNORMAL HIGH (ref 232–1245)

## 2016-09-17 LAB — T4, FREE: Free T4: 0.89 ng/dL (ref 0.82–1.77)

## 2016-09-17 LAB — URINE CULTURE: ORGANISM ID, BACTERIA: NO GROWTH

## 2016-09-17 LAB — VITAMIN D 25 HYDROXY (VIT D DEFICIENCY, FRACTURES): Vit D, 25-Hydroxy: 60.3 ng/mL (ref 30.0–100.0)

## 2016-09-18 ENCOUNTER — Other Ambulatory Visit: Payer: Self-pay | Admitting: Family Medicine

## 2016-09-18 ENCOUNTER — Telehealth: Payer: Self-pay | Admitting: Family Medicine

## 2016-09-18 MED ORDER — BUTALBITAL-APAP-CAFFEINE 50-325-40 MG PO TABS: 1.0000 | ORAL_TABLET | Freq: Four times a day (QID) | ORAL | 0 refills | Status: DC | PRN

## 2016-09-18 MED ORDER — CILIDINIUM-CHLORDIAZEPOXIDE 2.5-5 MG PO CAPS: ORAL_CAPSULE | ORAL | 0 refills | Status: DC

## 2016-09-18 MED ORDER — DULOXETINE HCL 60 MG PO CPEP: 120.0000 mg | ORAL_CAPSULE | Freq: Every day | ORAL | 1 refills | Status: DC

## 2016-09-18 NOTE — Telephone Encounter (Signed)
Pt's husband came

## 2016-09-18 NOTE — Telephone Encounter (Signed)
SS req refill Atenolol Seen 09/16/2016 - to return in 3 months- appt on file Refilled x 90 days

## 2016-09-22 NOTE — Telephone Encounter (Signed)
Mychart message sent to pt about making an apt for more refills °

## 2016-09-24 DIAGNOSIS — R296 Repeated falls: Secondary | ICD-10-CM | POA: Insufficient documentation

## 2016-09-24 DIAGNOSIS — H8113 Benign paroxysmal vertigo, bilateral: Secondary | ICD-10-CM | POA: Insufficient documentation

## 2016-10-07 ENCOUNTER — Telehealth: Payer: Self-pay | Admitting: Family Medicine

## 2016-10-07 NOTE — Telephone Encounter (Signed)
Pt is looking for lab results  Best number [phone]

## 2016-10-08 ENCOUNTER — Other Ambulatory Visit: Payer: Self-pay | Admitting: Family Medicine

## 2016-10-08 NOTE — Telephone Encounter (Signed)
Pt is calling again to check on her lab results.  I did advise pt that Katrinka Blazing has not put in her interpretation yet and that is probably why she hasn't heard anything yet.  Please advise, the pt is worried that there is something wrong.

## 2016-10-09 NOTE — Telephone Encounter (Signed)
Please advise 

## 2016-10-12 NOTE — Telephone Encounter (Signed)
Call --- labs are all normal.  No evidence of urinary tract infection.

## 2016-10-13 ENCOUNTER — Telehealth: Payer: Self-pay | Admitting: *Deleted

## 2016-10-13 NOTE — Telephone Encounter (Signed)
Pt advised that all labs are normal, no UTI.

## 2016-10-14 ENCOUNTER — Ambulatory Visit
Admission: RE | Admit: 2016-10-14 | Discharge: 2016-10-14 | Disposition: A | Discharge status: Home / Self Care | Payer: Federal, State, Local not specified - PPO | Source: Ambulatory Visit | Attending: Family Medicine | Admitting: Family Medicine

## 2016-10-14 DIAGNOSIS — Z1239 Encounter for other screening for malignant neoplasm of breast: Secondary | ICD-10-CM

## 2016-10-19 ENCOUNTER — Other Ambulatory Visit: Payer: Self-pay | Admitting: Family Medicine

## 2016-10-19 DIAGNOSIS — R928 Other abnormal and inconclusive findings on diagnostic imaging of breast: Secondary | ICD-10-CM

## 2016-10-22 ENCOUNTER — Other Ambulatory Visit: Payer: Self-pay | Admitting: Family Medicine

## 2016-10-22 ENCOUNTER — Ambulatory Visit
Admission: RE | Admit: 2016-10-22 | Discharge: 2016-10-22 | Disposition: A | Discharge status: Home / Self Care | Payer: Federal, State, Local not specified - PPO | Source: Ambulatory Visit | Attending: Family Medicine | Admitting: Family Medicine

## 2016-10-22 DIAGNOSIS — N6320 Unspecified lump in the left breast, unspecified quadrant: Secondary | ICD-10-CM

## 2016-10-22 DIAGNOSIS — R928 Other abnormal and inconclusive findings on diagnostic imaging of breast: Secondary | ICD-10-CM

## 2016-10-22 DIAGNOSIS — R921 Mammographic calcification found on diagnostic imaging of breast: Secondary | ICD-10-CM

## 2016-10-22 DIAGNOSIS — N6310 Unspecified lump in the right breast, unspecified quadrant: Secondary | ICD-10-CM

## 2016-10-26 ENCOUNTER — Ambulatory Visit
Admission: RE | Admit: 2016-10-26 | Discharge: 2016-10-26 | Disposition: A | Discharge status: Home / Self Care | Payer: Federal, State, Local not specified - PPO | Source: Ambulatory Visit | Attending: Family Medicine | Admitting: Family Medicine

## 2016-10-26 ENCOUNTER — Other Ambulatory Visit: Payer: Self-pay | Admitting: Family Medicine

## 2016-10-26 DIAGNOSIS — N6310 Unspecified lump in the right breast, unspecified quadrant: Secondary | ICD-10-CM

## 2016-10-26 DIAGNOSIS — R921 Mammographic calcification found on diagnostic imaging of breast: Secondary | ICD-10-CM

## 2016-10-31 ENCOUNTER — Other Ambulatory Visit: Payer: Self-pay | Admitting: Family Medicine

## 2016-11-03 ENCOUNTER — Other Ambulatory Visit: Payer: Self-pay | Admitting: Family Medicine

## 2016-11-05 NOTE — Telephone Encounter (Signed)
Dr Katrinka BlazingSmith, this patient was seen 09/10/16 and has a follow up scheduled for 12/21/17. Ok to fill this Librax? Please advise, thank you./ S.Paulette Lynch,CMA

## 2016-11-09 MED ORDER — CILIDINIUM-CHLORDIAZEPOXIDE 2.5-5 MG PO CAPS: 1.0000 | ORAL_CAPSULE | Freq: Two times a day (BID) | ORAL | 0 refills | Status: DC | PRN

## 2016-11-09 NOTE — Telephone Encounter (Signed)
Please call in Librax as approved.

## 2016-11-13 NOTE — Telephone Encounter (Signed)
Called in Rx to pharmacy.

## 2016-11-30 ENCOUNTER — Other Ambulatory Visit: Payer: Self-pay | Admitting: Family Medicine

## 2016-11-30 NOTE — Telephone Encounter (Signed)
Controlled substance 

## 2016-12-01 NOTE — Telephone Encounter (Signed)
Please call in Librax as approved to [facility].

## 2016-12-03 NOTE — Telephone Encounter (Signed)
Rx has been called into pharmacy. Informed pt.

## 2016-12-18 ENCOUNTER — Other Ambulatory Visit: Payer: Self-pay | Admitting: Family Medicine

## 2016-12-21 ENCOUNTER — Ambulatory Visit: Payer: Federal, State, Local not specified - PPO | Admitting: Family Medicine

## 2016-12-21 ENCOUNTER — Other Ambulatory Visit: Payer: Self-pay

## 2016-12-21 ENCOUNTER — Encounter: Payer: Self-pay | Admitting: Family Medicine

## 2016-12-21 VITALS — BP 140/80 | HR 111 | Temp 98.0°F | Resp 16 | Ht 65.35 in | Wt 171.0 lb

## 2016-12-21 DIAGNOSIS — M797 Fibromyalgia: Secondary | ICD-10-CM

## 2016-12-21 DIAGNOSIS — K219 Gastro-esophageal reflux disease without esophagitis: Secondary | ICD-10-CM | POA: Diagnosis not present

## 2016-12-21 DIAGNOSIS — K580 Irritable bowel syndrome with diarrhea: Secondary | ICD-10-CM

## 2016-12-21 DIAGNOSIS — E538 Deficiency of other specified B group vitamins: Secondary | ICD-10-CM | POA: Diagnosis not present

## 2016-12-21 DIAGNOSIS — F341 Dysthymic disorder: Secondary | ICD-10-CM

## 2016-12-21 DIAGNOSIS — G43709 Chronic migraine without aura, not intractable, without status migrainosus: Secondary | ICD-10-CM

## 2016-12-21 DIAGNOSIS — J340 Abscess, furuncle and carbuncle of nose: Secondary | ICD-10-CM

## 2016-12-21 DIAGNOSIS — H8113 Benign paroxysmal vertigo, bilateral: Secondary | ICD-10-CM | POA: Diagnosis not present

## 2016-12-21 DIAGNOSIS — F5104 Psychophysiologic insomnia: Secondary | ICD-10-CM

## 2016-12-21 MED ORDER — DOXYCYCLINE HYCLATE 100 MG PO TABS: 100.0000 mg | ORAL_TABLET | Freq: Two times a day (BID) | ORAL | 0 refills | Status: DC

## 2016-12-21 MED ORDER — ESCITALOPRAM OXALATE 5 MG PO TABS: 5.0000 mg | ORAL_TABLET | Freq: Every day | ORAL | 0 refills | Status: DC

## 2016-12-21 MED ORDER — CYANOCOBALAMIN 1000 MCG/ML IJ SOLN
1000.0000 ug | Freq: Once | INTRAMUSCULAR | Status: AC
  Administered 2016-12-21: 1000 ug via INTRAMUSCULAR

## 2016-12-21 MED ORDER — CYCLOBENZAPRINE HCL 5 MG PO TABS
5.0000 mg | ORAL_TABLET | Freq: Every day | ORAL | 1 refills | Status: DC | PRN
Start: 2016-12-21 — End: 2017-06-19

## 2016-12-21 MED ORDER — DULOXETINE HCL 60 MG PO CPEP: 60.0000 mg | ORAL_CAPSULE | Freq: Every day | ORAL | 1 refills | Status: DC

## 2016-12-21 MED ORDER — KETOROLAC TROMETHAMINE 60 MG/2ML IM SOLN
30.0000 mg | Freq: Once | INTRAMUSCULAR | Status: AC
  Administered 2016-12-21: 30 mg via INTRAMUSCULAR

## 2016-12-21 NOTE — Progress Notes (Signed)
Subjective:    Patient ID: Mandy Diaz, female    DOB: May 07, 1950, 66 y.o.   MRN: [id number]  12/21/2016  Anxiety (3 month follow-up ) and Nose Problem (pt states her nose has been giving her pain x 1 week. Her nose is red and painful to touch. )    HPI This 66 y.o. female presents for three month follow-up evaluation of anxiety and dizziness and new onset nasal redness.    Nose redness: onset four days ago.  R sided redness at distal tip of nose; dry and gritty along skin.  Painful.  +fever Tmax 98.6 and 98.3 which is feverish for patient; runs low grade.  +chills and sweats.  Mild PND; glands are tender B anterior neck.  Intermittent ear pain; no sore throat.  No nasal congestion or rhinorrhea.  Husband researched; advised to use Mupirocin; has been applying Mupircin nasally and topically three times daily.    Migraines: has a migraine right now.  Having three migraines per month.  Also has tension headaches as well.    Palpitations/DOE/chest tightness: s/p consultation by Dr. Adella Hare; s/p MR cardiac stress on 12/04/16; WNL; stress testing negative.  S/p event monitor completed. Elevated blood pressure with elevated pulse; 148/108 with pulse 148.  Stood up, 167/96 and 123. Called Dr. Adella Hare, pulse is elevated at each visit.  Pulse 90 at MRI stress testing visit.  Went down at visit because administered dye.  Visit with cardiologist of 145/45 and 102 pulse.  This occurred in twenties and placed on Valium.   Nerves are bad.  Does not take medication for anxiety; does not like how it makes patient feel. No longer recognizes self.  Wants to be alone.  Irritable. Does not like how it makes pt feel.  No panic attacks.  Irritable. Nothing suites patient.  Cymbalta 60mg  two daily.  Stopped Wellbutrin.   Lexapro in past ---- not sure. Zoloft in past --- not sure.   Has been taking some form of antidepressant since teens.  Family is chronic depression. Taking Aleve and Flexeril.   Candida  positive with ENT.  Causes bloating.     BP Readings from Last 3 Encounters:  12/21/16 140/80  09/16/16 (!) 141/82  02/07/16 138/83   Wt Readings from Last 3 Encounters:  12/21/16 171 lb (77.6 kg)  09/16/16 171 lb (77.6 kg)  02/07/16 168 lb (76.2 kg)   Immunization History  Administered Date(s) Administered  . Influenza Split 12/24/2011  . Influenza Whole 11/28/2008  . Influenza,inj,Quad PF,6+ Mos 10/28/2012, 01/24/2014, 10/19/2014, 09/16/2016  . Pneumococcal Polysaccharide-23 10/28/2012  . Td 11/02/2007, 07/15/2010, 07/16/2010    Review of Systems  Constitutional: Negative for chills, diaphoresis, fatigue and fever.  HENT: Negative for congestion, ear pain, postnasal drip, rhinorrhea and sore throat.   Eyes: Negative for visual disturbance.  Respiratory: Negative for cough and shortness of breath.   Cardiovascular: Negative for chest pain, palpitations and leg swelling.  Gastrointestinal: Negative for abdominal pain, constipation, diarrhea, nausea and vomiting.  Endocrine: Negative for cold intolerance, heat intolerance, polydipsia, polyphagia and polyuria.  Musculoskeletal: Positive for arthralgias and back pain.  Skin: Positive for color change. Negative for pallor, rash and wound.  Neurological: Positive for dizziness and headaches. Negative for tremors, seizures, syncope, facial asymmetry, speech difficulty, weakness, light-headedness and numbness.  Psychiatric/Behavioral: Positive for dysphoric mood. Negative for self-injury, sleep disturbance and suicidal ideas. The patient is nervous/anxious.     Past Medical History:  Diagnosis Date  . Anxiety   .  Depression   . Fibromyalgia 12/2012   s/p rheumatology consultation to confirm diagnosis; no evidence of autoimmune process.  Marland Kitchen GERD (gastroesophageal reflux disease)   . Hypertension   . IBS (irritable bowel syndrome)   . Menopause   . Migraine   . Osteoarthritis   . Thyroid disease   . Urinary incontinence     Past Surgical History:  Procedure Laterality Date  . CHOLECYSTECTOMY     Allergies  Allergen Reactions  . Metoclopramide Hcl Other (See Comments)    REACTION: Affected nerves  . Sulfamethoxazole Other (See Comments)    REACTION: Sores in mouth  . Zofran [Ondansetron Hcl]   . Amoxicillin Itching and Rash    REACTION: Rash  . Penicillins Rash   Current Outpatient Medications on File Prior to Visit  Medication Sig Dispense Refill  . atenolol (TENORMIN) 25 MG tablet TAKE 1 TABLET (25 MG TOTAL) BY MOUTH DAILY. 90 tablet 0  . butalbital-acetaminophen-caffeine (FIORICET, ESGIC) 50-325-40 MG tablet Take 1 tablet by mouth every 6 (six) hours as needed for headache. 40 tablet 0  . Cholecalciferol 2000 units CAPS Take 4,000 Units by mouth daily.    . clidinium-chlordiazePOXIDE (LIBRAX) 5-2.5 MG capsule TAKE ONE CAPSULE BY MOUTH TWICE A DAY AS NEEDED 45 capsule 0  . cyanocobalamin (,VITAMIN B-12,) 1000 MCG/ML injection Inject 1 mL (1,000 mcg total) into the muscle every 14 (fourteen) days. (Patient taking differently: Inject 1,000 mcg into the muscle every Friday. ) 6 mL 3  . estradiol (ESTRACE) 0.5 MG tablet Take 1 tablet (0.5 mg total) by mouth daily. (Patient taking differently: Take 1 mg by mouth daily. ) 30 tablet 0  . Magnesium Malate POWD Take 3,900 mg by mouth daily. TAKES 3 x 1300MG  CAPSULES ONCE DAILY    . NON FORMULARY Take 150 mg by mouth every morning. PROGENOLONE (for memory and hormonal balance).    . NON FORMULARY Take 2 capsules by mouth daily. ALL ADRENAL RAW.     Marland Kitchen omeprazole (PRILOSEC) 20 MG capsule TAKE 1 CAPSULE (20 MG TOTAL) BY MOUTH DAILY. 90 capsule 3  . progesterone (PROMETRIUM) 200 MG capsule     . thyroid (ARMOUR) 65 MG tablet Take 65 mg by mouth daily.    . traZODone (DESYREL) 50 MG tablet TAKE 1 TABLET BY MOUTH AT BEDTIME AS NEEDED FOR SLEEP. 90 tablet 1  . valACYclovir (VALTREX) 500 MG tablet Take 1 tablet (500 mg total) by mouth 3 (three) times daily. For viral  infection 90 tablet 1   No current facility-administered medications on file prior to visit.    Social History   Socioeconomic History  . Marital status: Married    Spouse name: [name]"  . Number of children: 2  . Years of education: 12.5  . Highest education level: Not on file  Social Needs  . Financial resource strain: Not on file  . Food insecurity - worry: Not on file  . Food insecurity - inability: Not on file  . Transportation needs - medical: Not on file  . Transportation needs - non-medical: Not on file  Occupational History  . Not on file  Tobacco Use  . Smoking status: Never Smoker  . Smokeless tobacco: Never Used  Substance and Sexual Activity  . Alcohol use: No  . Drug use: No  . Sexual activity: Yes    Birth control/protection: None  Other Topics Concern  . Not on file  Social History Narrative   Lives with husband Sophiamarie Nease  Caffeine use: Drinks coffee every morning (1 cup)      Regular exercise - NO      Family History  Problem Relation Age of Onset  . Stroke Mother   . Tremor Mother   . Cancer Mother        Lung cancer; minimal smoker  . Cancer Father        Liver cancer  . Heart disease Father   . Diabetes Brother   . Heart disease Brother   . Neuropathy Brother   . Heart disease Daughter   . Aneurysm Paternal Grandfather   . Arthritis Unknown   . Diabetes Unknown        1st degree relative  . Hyperlipidemia Unknown   . Stroke Unknown        Female 1st degree relative <50  . Heart disease Unknown   . Breast cancer Neg Hx        Objective:    BP 140/80   Pulse (!) 111   Temp 98 F (36.7 C) (Oral)   Resp 16   Ht 5' 5.35" (1.66 m)   Wt 171 lb (77.6 kg)   SpO2 95%   BMI 28.15 kg/m  Physical Exam  Constitutional: She is oriented to person, place, and time. She appears well-developed and well-nourished. No distress.  HENT:  Head: Normocephalic and atraumatic.  Right Ear: Tympanic membrane, external ear and ear canal  normal.  Left Ear: Tympanic membrane, external ear and ear canal normal.  Nose: Mucosal edema and sinus tenderness present. No rhinorrhea, nose lacerations, nasal deformity, septal deviation or nasal septal hematoma. No epistaxis.  No foreign bodies. Right sinus exhibits no maxillary sinus tenderness and no frontal sinus tenderness. Left sinus exhibits no maxillary sinus tenderness and no frontal sinus tenderness.  Mouth/Throat: Oropharynx is clear and moist.  Diffuse erythema distal nose B R>L; with induration; no fluctuance; mild swelling distal nose.  No pustules or vesicles.  No abnormality in nares.  Eyes: Conjunctivae and EOM are normal. Pupils are equal, round, and reactive to light.  Neck: Normal range of motion. Neck supple. Carotid bruit is not present. No thyromegaly present.  Cardiovascular: Normal rate, regular rhythm, normal heart sounds and intact distal pulses. Exam reveals no gallop and no friction rub.  No murmur heard. Pulmonary/Chest: Effort normal and breath sounds normal. She has no wheezes. She has no rales.  Abdominal: Soft. Bowel sounds are normal. She exhibits no distension and no mass. There is no tenderness. There is no rebound and no guarding.  Lymphadenopathy:    She has no cervical adenopathy.  Neurological: She is alert and oriented to person, place, and time. No cranial nerve deficit.  Skin: Skin is warm and dry. No rash noted. She is not diaphoretic. No erythema. No pallor.  Psychiatric: She has a normal mood and affect. Her behavior is normal.   No results found. Depression screen [facility] 2[facility] 12/21/2016 09/16/2016 10/22/2015 02/27/2015 02/17/2015  Decreased Interest 0 1 - 0 2  Down, Depressed, Hopeless 0 1 1 0 2  PHQ - 2 Score 0 2 1 0 4  Altered sleeping - 0 - - 2  Tired, decreased energy - 1 - - 2  Change in appetite - 0 - - 2  Feeling bad or failure about yourself  - 1 - - 2  Trouble concentrating - 1 - - 2  Moving slowly or fidgety/restless - 0 - - 2  Suicidal  thoughts - 0 - - 2  PHQ-9 Score - 5 - - 18   Fall Risk  12/21/2016 09/16/2016 10/22/2015 01/25/2015 01/25/2015  Falls in the past year? No Yes No No No  Number falls in past yr: - 2 or more - - -  Injury with Fall? - Yes - - -        Assessment & Plan:   1. ANXIETY DEPRESSION   2. Irritable bowel syndrome with diarrhea   3. Chronic migraine without aura without status migrainosus, not intractable   4. Gastroesophageal reflux disease without esophagitis   5. Psychophysiological insomnia   6. Fibromyalgia   7. 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 Cellulitis of nasal tip     New onset cellulitis nasal tip concerning for MRSA infection; rx for Doxycycline provided. Uncontrolled anxiety and depression; decrease Cymbalta to 60mg  daily; add Lexapro 5mg  at bedtime.  Avoid benzos. Chronic migraines; s/p Toradol injection in office. Rx for Flexeril provided for fibromyalgia; pt requesting stronger pain control yet advised that narcotics not indicated for fibromyalgia pain. Recommend muscle relaxer and Tylenol and stretches daily. S/p B12 injection in office.  No orders of the defined types were placed in this encounter.  Meds ordered this encounter  Medications  . doxycycline (VIBRA-TABS) 100 MG tablet    Sig: Take 1 tablet (100 mg total) by mouth 2 (two) times daily.    Dispense:  20 tablet    Refill:  0  . escitalopram (LEXAPRO) 5 MG tablet    Sig: Take 1 tablet (5 mg total) by mouth at bedtime.    Dispense:  90 tablet    Refill:  0  . DULoxetine (CYMBALTA) 60 MG capsule    Sig: Take 1 capsule (60 mg total) by mouth daily.    Dispense:  90 capsule    Refill:  1  . cyclobenzaprine (FLEXERIL) 5 MG tablet    Sig: Take 1 tablet (5 mg total) by mouth daily as needed for muscle spasms.    Dispense:  90 tablet    Refill:  1  . ketorolac (TORADOL) injection 30 mg    Return in about 3 months (around 03/21/2017) for follow-up chronic medical  conditions.   Darriel Sinquefield Paulita FujitaMartin Selby Slovacek, M.D. Primary [facility] at [facility]  [place] [facility] [address] [place], [place]  [id number] [phone] phone [phone]) [phone] fax

## 2016-12-21 NOTE — Patient Instructions (Addendum)
Start taking Flexeril every day. Decrease Cymbalta to 60mg  daily Take Tylenol for pain as needed with Aleve.   IF you received an x-ray today, you will receive an invoice from [facility]. Please contact [facility] at [phone] with questions or concerns regarding your invoice.   IF you received labwork today, you will receive an invoice from [facility]. Please contact [facility] at [phone] with questions or concerns regarding your invoice.   Our billing staff will not be able to assist you with questions regarding bills from these companies.  You will be contacted with the lab results as soon as they are available. The fastest way to get your results is to activate your My Chart account. Instructions are located on the last page of this paperwork. If you have not heard from us regarding the results in 2 weeks, please contact this office.     Cellulitis, Adult Cellulitis is a skin infection. The infected area is usually red and tender. This condition occurs most often in the arms and lower legs. The infection can travel to the muscles, blood, and underlying tissue and become serious. It is very important to get treated for this condition. What are the causes? Cellulitis is caused by bacteria. The bacteria enter through a break in the skin, such as a cut, burn, insect bite, open sore, or crack. What increases the risk? This condition is more likely to occur in people who:  Have a weak defense system (immune system).  Have open wounds on the skin such as cuts, burns, bites, and scrapes. Bacteria can enter the body through these open wounds.  Are older.  Have diabetes.  Have a type of Sachdeva-lasting (chronic) liver disease (cirrhosis) or kidney disease.  Use IV drugs.  What are the signs or symptoms? Symptoms of this condition include:  Redness, streaking, or spotting on the skin.  Swollen area of the skin.  Tenderness or pain when an area of the skin is  touched.  Warm skin.  Fever.  Chills.  Blisters.  How is this diagnosed? This condition is diagnosed based on a medical history and physical exam. You may also have tests, including:  Blood tests.  Lab tests.  Imaging tests.  How is this treated? Treatment for this condition may include:  Medicines, such as antibiotic medicines or antihistamines.  Supportive care, such as rest and application of cold or warm cloths (cold or warm compresses) to the skin.  Hospital care, if the condition is severe.  The infection usually gets better within 1-2 days of treatment. Follow these instructions at home:  Take over-the-counter and prescription medicines only as told by your health care provider.  If you were prescribed an antibiotic medicine, take it as told by your health care provider. Do not stop taking the antibiotic even if you start to feel better.  Drink enough fluid to keep your urine clear or pale yellow.  Do not touch or rub the infected area.  Raise (elevate) the infected area above the level of your heart while you are sitting or lying down.  Apply warm or cold compresses to the affected area as told by your health care provider.  Keep all follow-up visits as told by your health care provider. This is important. These visits let your health care provider make sure a more serious infection is not developing. Contact a health care provider if:  You have a fever.  Your symptoms do not improve within 1-2 days of starting treatment.  Your  bone or joint underneath the infected area becomes painful after the skin has healed.  Your infection returns in the same area or another area.  You notice a swollen bump in the infected area.  You develop new symptoms.  You have a general ill feeling (malaise) with muscle aches and pains. Get help right away if:  Your symptoms get worse.  You feel very sleepy.  You develop vomiting or diarrhea that persists.  You  notice red streaks coming from the infected area.  Your red area gets larger or turns dark in color. This information is not intended to replace advice given to you by your health care provider. Make sure you discuss any questions you have with your health care provider. Document Released: 10/15/2004 Document Revised: 05/16/2015 Document Reviewed: 11/14/2014 Elsevier Interactive Patient Education  2017 [employer]

## 2016-12-22 ENCOUNTER — Telehealth: Payer: Self-pay | Admitting: Family Medicine

## 2016-12-22 NOTE — Telephone Encounter (Signed)
Pt called waiting to clarify directions the times to take her lexapro and cymbalta; per  Dr Silva BandyKristi Diaz's note:"  escitalopram (LEXAPRO) 5 MG tablet    Sig: Take 1 tablet (5 mg total) by mouth at bedtime  And  DULoxetine (CYMBALTA) 60 MG capsule     Sig: Take 1 capsule (60 mg total) by mouth daily.   Pt also has questions about her atenolol; pt states that she was told to "2 atenolol daily until she was seen by her cardiologist"; The MD's note still reads   atenolol (TENORMIN) 25 MG tablet TAKE 1 TABLET (25 MG TOTAL) BY MOUTH DAILY.  Please verify dosage and notify pt at [phone]; Will route to [facility] pool for clarification of orders;  l

## 2016-12-25 MED ORDER — ATENOLOL 25 MG PO TABS: 25.0000 mg | ORAL_TABLET | Freq: Every day | ORAL | 1 refills | Status: DC

## 2016-12-25 NOTE — Addendum Note (Signed)
Addended by: Ethelda ChickSMITH, KRISTI M on: 12/25/2016 10:41 AM   Modules accepted: Orders

## 2016-12-25 NOTE — Telephone Encounter (Signed)
Call --- Increase Atenolol to two tablets daily until seen by cardiology.  I have sent in a refill of Atenolol to reflect this change.  2.  Take Lexapro at bedtime as prescribed.  3. Take one Cymbalta every morning as prescribed.

## 2016-12-25 NOTE — Telephone Encounter (Signed)
Spoke with pt about Dr. Katrinka BlazingSmith recommendations and medication changes, she verbalized understanding. Told pt to give us a call if she has anymore questions.

## 2016-12-30 ENCOUNTER — Other Ambulatory Visit: Payer: Self-pay | Admitting: Family Medicine

## 2017-01-01 NOTE — Telephone Encounter (Signed)
Librax refill. Last refill 12/01/16. Last OV 12/21/16.

## 2017-01-15 ENCOUNTER — Telehealth: Payer: Self-pay | Admitting: *Deleted

## 2017-01-15 NOTE — Telephone Encounter (Signed)
Received notification that [facility] will need a letter of medical necessity & copy of office note in order to process claim for labs from 03/2016.   Printed copy of office note.

## 2017-01-20 ENCOUNTER — Encounter: Payer: Self-pay | Admitting: *Deleted

## 2017-01-20 NOTE — Telephone Encounter (Signed)
You can send them the last note. Also patient had unexplained ataxia, workup up had been negative, [facility] for hereditary forms of atax[facility]. thanks

## 2017-01-20 NOTE — Telephone Encounter (Signed)
Office noted printed and letter written for medical necessity.

## 2017-01-27 ENCOUNTER — Other Ambulatory Visit: Payer: Self-pay | Admitting: Family Medicine

## 2017-01-27 NOTE — Telephone Encounter (Signed)
Requesting refill of Librax  LOV 12//18 with Dr. Katrinka BlazingSmith

## 2017-01-27 NOTE — Telephone Encounter (Signed)
Copied from CRM [phone]. Topic: Quick Communication - See Telephone Encounter >> Jan 27, 2017 12:06 PM Rudi CocoLathan, Chasity Outten M, NT wrote: CRM for notification. See Telephone encounter for:   01/27/17. Pharmacy calling to get Refill on med librax   LIBRAX 5-2.5 mg  [id number] [place]- [place], [place] - [address] [place] [id number] Phone: [phone] Fax: [phone]

## 2017-01-29 MED ORDER — CILIDINIUM-CHLORDIAZEPOXIDE 2.5-5 MG PO CAPS: ORAL_CAPSULE | ORAL | 0 refills | Status: DC

## 2017-01-29 NOTE — Telephone Encounter (Signed)
Refill req for Librax sent to Dr. Katrinka BlazingSmith

## 2017-03-10 ENCOUNTER — Other Ambulatory Visit: Payer: Self-pay

## 2017-03-10 NOTE — Telephone Encounter (Signed)
Fax req for 90 day supplyt Chlord/Clidi cap 5-2.5mg  Sent to Dr. Katrinka BlazingSmith for approval

## 2017-03-14 MED ORDER — CILIDINIUM-CHLORDIAZEPOXIDE 2.5-5 MG PO CAPS: ORAL_CAPSULE | ORAL | 0 refills | Status: DC

## 2017-03-15 ENCOUNTER — Ambulatory Visit: Payer: Federal, State, Local not specified - PPO | Admitting: Family Medicine

## 2017-03-15 ENCOUNTER — Other Ambulatory Visit: Payer: Self-pay | Admitting: Family Medicine

## 2017-03-16 ENCOUNTER — Other Ambulatory Visit: Payer: Self-pay | Admitting: Family Medicine

## 2017-03-18 ENCOUNTER — Other Ambulatory Visit: Payer: Self-pay | Admitting: Family Medicine

## 2017-04-19 ENCOUNTER — Telehealth: Payer: Self-pay | Admitting: Family Medicine

## 2017-04-19 NOTE — Telephone Encounter (Signed)
Patient was advised that is Dr. Michaelle CopasSmith's first available.  Advised we could get her in sooner to see someone else.  Patient refused. Patient was advised with her symptoms she should be seen sooner or go to ER.  Patient voiced understanding

## 2017-04-19 NOTE — Telephone Encounter (Unsigned)
Copied from CRM [phone]. Topic: Quick Communication - See Telephone Encounter >> Apr 19, 2017  1:40 PM Raquel SarnaHayes, Teresa G wrote: Pt asking if she can be worked in w/ Dr. Katrinka BlazingSmith sooner that her 05-11-17 appt due to her increased pain in abdomen - chest and back area.  Feels like she cannot breath well. Pt refused to speak w/ NT.

## 2017-04-30 ENCOUNTER — Other Ambulatory Visit: Payer: Self-pay | Admitting: Family Medicine

## 2017-05-11 ENCOUNTER — Encounter: Payer: Self-pay | Admitting: Family Medicine

## 2017-05-11 ENCOUNTER — Other Ambulatory Visit: Payer: Self-pay

## 2017-05-11 ENCOUNTER — Telehealth: Payer: Self-pay | Admitting: Family Medicine

## 2017-05-11 ENCOUNTER — Ambulatory Visit: Payer: Federal, State, Local not specified - PPO | Admitting: Family Medicine

## 2017-05-11 VITALS — BP 130/82 | HR 105 | Temp 98.0°F | Resp 16 | Ht 65.75 in | Wt 169.0 lb

## 2017-05-11 DIAGNOSIS — E7800 Pure hypercholesterolemia, unspecified: Secondary | ICD-10-CM

## 2017-05-11 DIAGNOSIS — R1032 Left lower quadrant pain: Secondary | ICD-10-CM

## 2017-05-11 DIAGNOSIS — M797 Fibromyalgia: Secondary | ICD-10-CM

## 2017-05-11 DIAGNOSIS — Z6827 Body mass index (BMI) 27.0-27.9, adult: Secondary | ICD-10-CM | POA: Diagnosis not present

## 2017-05-11 DIAGNOSIS — R102 Pelvic and perineal pain: Secondary | ICD-10-CM | POA: Diagnosis not present

## 2017-05-11 DIAGNOSIS — F341 Dysthymic disorder: Secondary | ICD-10-CM | POA: Diagnosis not present

## 2017-05-11 DIAGNOSIS — Z124 Encounter for screening for malignant neoplasm of cervix: Secondary | ICD-10-CM | POA: Diagnosis not present

## 2017-05-11 DIAGNOSIS — N898 Other specified noninflammatory disorders of vagina: Secondary | ICD-10-CM | POA: Diagnosis not present

## 2017-05-11 LAB — COMPREHENSIVE METABOLIC PANEL
A/G RATIO: 1.8 (ref 1.2–2.2)
ALK PHOS: 60 IU/L (ref 39–117)
ALT: 21 IU/L (ref 0–32)
AST: 20 IU/L (ref 0–40)
Albumin: 3.9 g/dL (ref 3.6–4.8)
BUN/Creatinine Ratio: 19 (ref 12–28)
BUN: 14 mg/dL (ref 8–27)
Bilirubin Total: <0.2 mg/dL (ref 0.0–1.2)
CALCIUM: 9.3 mg/dL (ref 8.7–10.3)
CO2: 22 mmol/L (ref 20–29)
CREATININE: 0.75 mg/dL (ref 0.57–1.00)
Chloride: 105 mmol/L (ref 96–106)
GFR calc Af Amer: 97 mL/min/{1.73_m2} (ref >59)
GFR calc non Af Amer: 84 mL/min/{1.73_m2} (ref >59)
GLOBULIN, TOTAL: 2.2 g/dL (ref 1.5–4.5)
Glucose: 99 mg/dL (ref 65–99)
POTASSIUM: 4.2 mmol/L (ref 3.5–5.2)
SODIUM: 139 mmol/L (ref 134–144)
Total Protein: 6.1 g/dL (ref 6.0–8.5)

## 2017-05-11 LAB — POCT WET + KOH PREP
TRICH BY WET PREP: ABSENT
YEAST BY WET PREP: ABSENT
Yeast by KOH: ABSENT

## 2017-05-11 LAB — CBC WITH DIFFERENTIAL/PLATELET
Basophils Absolute: 0 10*3/uL (ref 0.0–0.2)
Basos: 0 %
EOS (ABSOLUTE): 0 10*3/uL (ref 0.0–0.4)
EOS: 1 %
HEMATOCRIT: 39.3 % (ref 34.0–46.6)
Hemoglobin: 13.1 g/dL (ref 11.1–15.9)
IMMATURE GRANULOCYTES: 0 %
Immature Grans (Abs): 0 10*3/uL (ref 0.0–0.1)
LYMPHS ABS: 1.5 10*3/uL (ref 0.7–3.1)
Lymphs: 24 %
MCH: 29 pg (ref 26.6–33.0)
MCHC: 33.3 g/dL (ref 31.5–35.7)
MCV: 87 fL (ref 79–97)
MONOS ABS: 0.2 10*3/uL (ref 0.1–0.9)
Monocytes: 4 %
NEUTROS PCT: 71 %
Neutrophils Absolute: 4.5 10*3/uL (ref 1.4–7.0)
PLATELETS: 276 10*3/uL (ref 150–379)
RBC: 4.52 x10E6/uL (ref 3.77–5.28)
RDW: 13.6 % (ref 12.3–15.4)
WBC: 6.4 10*3/uL (ref 3.4–10.8)

## 2017-05-11 LAB — POCT URINALYSIS DIP (MANUAL ENTRY)
BILIRUBIN UA: NEGATIVE
GLUCOSE UA: NEGATIVE mg/dL
Ketones, POC UA: NEGATIVE mg/dL
LEUKOCYTES UA: NEGATIVE
NITRITE UA: NEGATIVE
Protein Ur, POC: NEGATIVE mg/dL
Spec Grav, UA: 1.025 (ref 1.010–1.025)
Urobilinogen, UA: 0.2 E.U./dL
pH, UA: 5.5 (ref 5.0–8.0)

## 2017-05-11 LAB — LIPID PANEL
CHOLESTEROL TOTAL: 211 mg/dL — AB (ref 100–199)
Chol/HDL Ratio: 4.9 ratio — ABNORMAL HIGH (ref 0.0–4.4)
HDL: 43 mg/dL (ref >39)
LDL Calculated: 135 mg/dL — ABNORMAL HIGH (ref 0–99)
TRIGLYCERIDES: 163 mg/dL — AB (ref 0–149)
VLDL Cholesterol Cal: 33 mg/dL (ref 5–40)

## 2017-05-11 NOTE — Telephone Encounter (Signed)
[facility] the [facility]s order needs to be changed to [id number].

## 2017-05-11 NOTE — Patient Instructions (Signed)
     IF you received an x-ray today, you will receive an invoice from [facility]. Please contact [facility] at [phone] with questions or concerns regarding your invoice.   IF you received labwork today, you will receive an invoice from [facility]. Please contact [facility] at [phone] with questions or concerns regarding your invoice.   Our billing staff will not be able to assist you with questions regarding bills from these companies.  You will be contacted with the lab results as soon as they are available. The fastest way to get your results is to activate your My Chart account. Instructions are located on the last page of this paperwork. If you have not heard from us regarding the results in 2 weeks, please contact this office.     

## 2017-05-11 NOTE — Progress Notes (Signed)
Subjective:    Patient ID: Mandy Diaz, female    DOB: [birth year], 67 y.o.   MRN: [id number]  05/11/2017  Chronic Conditions (follow-up ) and Abdominal Pain (x 2 weeks )    HPI This 67 y.o. female presents for FOUR MONTH FOLLOW-UP of anxiety/depression, hypertension, fibromyalgia.  Management changes made at last visit include the following: New onset cellulitis nasal tip concerning for MRSA infection; rx for Doxycycline provided. Uncontrolled anxiety and depression; decrease Cymbalta to 60mg  daily; add Lexapro 5mg  at bedtime.  Avoid benzos. Chronic migraines; s/p Toradol injection in office. Rx for Flexeril provided for fibromyalgia; pt requesting stronger pain control yet advised that narcotics not indicated for fibromyalgia pain. Recommend muscle relaxer and Tylenol and stretches daily. S/p B12 injection in office.  UPDATE: Will warrant nasal polyp resected in RIGHT nare. Stopped Lexapro.  Only taking Cymbalta.  No imrpovement with Cymbalta. Taking two Cymbalta daily. No pleasure.  No enjoyment. No SI.  Not sure of purpose; [name] with own life; son has own life; son barely speaking with pateint.  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  Son has a good relationship with son; [name] works all the time.  Grandchildren are busy.  Best friend has not reached out since January. Does not see anyone.  Likes to cross stitch yet with neck unable to look down a lot. Likes to read but headaches interfere with that.    Underwent allergy testing.   Has Candida.  Must eliminate lots of food; cannot eat corn, almond, yeast.  Very limited diet.  Went to nutritionist with husband.  Nutritionist made recommendations. Cannot have sweet potatoes, tomatoes, corn. Can eat brussel sprouts and asparagus. No bacon; no sausage; no pickles; no radishes.   ENT performed allergy testing.  No happiness in foods. No sugar; no milk.   Now cannot have almonds.    LLQ pain: onset three  months ago. No fever/cihlls/sweats.  No nausea/vomiting/ diarrhea/constipation. No blood stools or black stools.  Chronic IBS with diarrhea and constipation.  No urinary symptoms.  No hematuria; no dyspareunia; +discharge vaginal.  Sexually active with husband only.   BP Readings from Last 3 Encounters:  05/21/17 120/80  05/11/17 130/82  12/21/16 140/80   Wt Readings from Last 3 Encounters:  05/21/17 170 lb 6.4 oz (77.3 kg)  05/11/17 169 lb (76.7 kg)  12/21/16 171 lb (77.6 kg)   Immunization History  Administered Date(s) Administered  . Influenza Split 12/24/2011  . Influenza Whole 11/28/2008  . Influenza,inj,Quad PF,6+ Mos 10/28/2012, 01/24/2014, 10/19/2014, 09/16/2016  . Pneumococcal Polysaccharide-23 10/28/2012  . Td 11/02/2007, 07/15/2010, 07/16/2010    Review of Systems  Constitutional: Negative for chills, diaphoresis, fatigue and fever.  Eyes: Negative for visual disturbance.  Respiratory: Negative for cough and shortness of breath.   Cardiovascular: Negative for chest pain, palpitations and leg swelling.  Gastrointestinal: Positive for abdominal pain. Negative for abdominal distention, anal bleeding, blood in stool, constipation, diarrhea, nausea, rectal pain and vomiting.  Endocrine: Negative for cold intolerance, heat intolerance, polydipsia, polyphagia and polyuria.  Genitourinary: Positive for vaginal discharge. Negative for decreased urine volume, difficulty urinating, dyspareunia, dysuria, enuresis, flank pain, frequency, genital sores, hematuria, menstrual problem, pelvic pain, urgency, vaginal bleeding and vaginal pain.  Musculoskeletal: Positive for arthralgias, back pain and myalgias.  Neurological: Negative for dizziness, tremors, seizures, syncope, facial asymmetry, speech difficulty, weakness, light-headedness, numbness and headaches.  Psychiatric/Behavioral: Positive for dysphoric mood and sleep disturbance. Negative for self-injury and  suicidal ideas. The  patient is nervous/anxious.     Past Medical History:  Diagnosis Date  . Anxiety   . Depression   . Fibromyalgia 12/2012   s/p rheumatology consultation to confirm diagnosis; no evidence of autoimmune process.  Marland Kitchen GERD (gastroesophageal reflux disease)   . Hypertension   . IBS (irritable bowel syndrome)   . Menopause   . Migraine   . Osteoarthritis   . Thyroid disease   . Urinary incontinence    Past Surgical History:  Procedure Laterality Date  . CHOLECYSTECTOMY     Allergies  Allergen Reactions  . Metoclopramide Hcl Other (See Comments)    REACTION: Affected nerves  . Sulfamethoxazole Other (See Comments)    REACTION: Sores in mouth  . Zofran [Ondansetron Hcl]   . Amoxicillin Itching and Rash    REACTION: Rash  . Penicillins Rash   Current Outpatient Medications on File Prior to Visit  Medication Sig Dispense Refill  . butalbital-acetaminophen-caffeine (FIORICET, ESGIC) 50-325-40 MG tablet Take 1 tablet by mouth every 6 (six) hours as needed for headache. (Patient not taking: Reported on 05/21/2017) 40 tablet 0  . Cholecalciferol 2000 units CAPS Take 4,000 Units by mouth daily.    . cyanocobalamin (,VITAMIN B-12,) 1000 MCG/ML injection Inject 1 mL (1,000 mcg total) into the muscle every 14 (fourteen) days. (Patient taking differently: Inject 1,000 mcg into the muscle every Friday. ) 6 mL 3  . estradiol (ESTRACE) 0.5 MG tablet Take 1 tablet (0.5 mg total) by mouth daily. (Patient taking differently: Take 1 mg by mouth daily. ) 30 tablet 0  . Magnesium Malate POWD Take 3,900 mg by mouth daily. TAKES 3 x 1300MG  CAPSULES ONCE DAILY    . NON FORMULARY Take 150 mg by mouth every morning. PROGENOLONE (for memory and hormonal balance).    . NON FORMULARY Take 2 capsules by mouth daily. ALL ADRENAL RAW.     Marland Kitchen omeprazole (PRILOSEC) 20 MG capsule TAKE 1 CAPSULE (20 MG TOTAL) BY MOUTH DAILY. 90 capsule 3  . progesterone (PROMETRIUM) 200 MG capsule     . thyroid (ARMOUR) 65 MG tablet  Take 65 mg by mouth daily.    . valACYclovir (VALTREX) 500 MG tablet Take 1 tablet (500 mg total) by mouth 3 (three) times daily. For viral infection 90 tablet 1   No current facility-administered medications on file prior to visit.    Social History   Socioeconomic History  . Marital status: Married    Spouse name: [name]"  . Number of children: 2  . Years of education: 12.5  . Highest education level: Not on file  Occupational History  . Not on file  Social Needs  . Financial resource strain: Not on file  . Food insecurity:    Worry: Not on file    Inability: Not on file  . Transportation needs:    Medical: Not on file    Non-medical: Not on file  Tobacco Use  . Smoking status: Never Smoker  . Smokeless tobacco: Never Used  Substance and Sexual Activity  . Alcohol use: No  . Drug use: No  . Sexual activity: Yes    Birth control/protection: None  Lifestyle  . Physical activity:    Days per week: Not on file    Minutes per session: Not on file  . Stress: Not on file  Relationships  . Social connections:    Talks on phone: Not on file    Gets together: Not on file  Attends religious service: Not on file    Active member of club or organization: Not on file    Attends meetings of clubs or organizations: Not on file    Relationship status: Not on file  . Intimate partner violence:    Fear of current or ex partner: Not on file    Emotionally abused: Not on file    Physically abused: Not on file    Forced sexual activity: Not on file  Other Topics Concern  . Not on file  Social History Narrative   Lives with husband Daivd CouncilHarold Mccormack   Caffeine use: Drinks coffee every morning (1 cup)      Regular exercise - NO      Family History  Problem Relation Age of Onset  . Stroke Mother   . Tremor Mother   . Cancer Mother        Lung cancer; minimal smoker  . Cancer Father        Liver cancer  . Heart disease Father   . Diabetes Brother   . Heart disease Brother    . Neuropathy Brother   . Heart disease Daughter   . Aneurysm Paternal Grandfather   . Arthritis Unknown   . Diabetes Unknown        1st degree relative  . Hyperlipidemia Unknown   . Stroke Unknown        Female 1st degree relative <50  . Heart disease Unknown   . Breast cancer Neg Hx        Objective:    BP 130/82   Pulse (!) 105   Temp 98 F (36.7 C) (Oral)   Resp 16   Ht 5' 5.75" (1.67 m)   Wt 169 lb (76.7 kg)   SpO2 98%   BMI 27.49 kg/m  Physical Exam  Constitutional: She is oriented to person, place, and time. She appears well-developed and well-nourished. No distress.  HENT:  Head: Normocephalic and atraumatic.  Right Ear: External ear normal.  Left Ear: External ear normal.  Nose: Nose normal.  Mouth/Throat: Oropharynx is clear and moist.  Eyes: Pupils are equal, round, and reactive to light. Conjunctivae and EOM are normal.  Neck: Normal range of motion and full passive range of motion without pain. Neck supple. No JVD present. Carotid bruit is not present. No thyromegaly present.  Cardiovascular: Normal rate, regular rhythm and normal heart sounds. Exam reveals no gallop and no friction rub.  No murmur heard. Pulmonary/Chest: Effort normal and breath sounds normal. She has no wheezes. She has no rales.  Abdominal: Soft. Bowel sounds are normal. She exhibits no distension and no mass. There is no tenderness. There is no rebound and no guarding.  Genitourinary: Uterus normal. There is no rash, tenderness or lesion on the right labia. There is no rash, tenderness or lesion on the left labia. Cervix exhibits no motion tenderness, no discharge and no friability. Right adnexum displays no mass and no tenderness. Left adnexum displays no mass, no tenderness and no fullness. No erythema, tenderness or bleeding in the vagina. No foreign body in the vagina. No signs of injury around the vagina. Vaginal discharge found.  Musculoskeletal:       Right shoulder: Normal.        Left shoulder: Normal.       Cervical back: Normal.  Lymphadenopathy:    She has no cervical adenopathy.  Neurological: She is alert and oriented to person, place, and time. She has normal reflexes. No cranial  nerve deficit. She exhibits normal muscle tone. Coordination normal.  Skin: Skin is warm and dry. No rash noted. She is not diaphoretic. No erythema. No pallor.  Psychiatric: She has a normal mood and affect. Her behavior is normal. Judgment and thought content normal.  Nursing note and vitals reviewed.  No results found. Depression screen [facility] 2/9 05/21/2017 05/11/2017 12/21/2016 09/16/2016 10/22/2015  Decreased Interest 0 0 0 1 -  Down, Depressed, Hopeless 0 0 0 1 1  PHQ - 2 Score 0 0 0 2 1  Altered sleeping - - - 0 -  Tired, decreased energy - - - 1 -  Change in appetite - - - 0 -  Feeling bad or failure about yourself  - - - 1 -  Trouble concentrating - - - 1 -  Moving slowly or fidgety/restless - - - 0 -  Suicidal thoughts - - - 0 -  PHQ-9 Score - - - 5 -   Fall Risk  05/21/2017 05/11/2017 12/21/2016 09/16/2016 10/22/2015  Falls in the past year? No No No Yes No  Number falls in past yr: - - - 2 or more -  Injury with Fall? - - - Yes -        Assessment & Plan:   1. Abdominal pain, LLQ   2. Vaginal discharge   3. Cervical cancer screening   4. Pelvic pain in female   5. Pure hypercholesterolemia   6. BMI 27.0-27.9,adult   7. Fibromyalgia   8. ANXIETY DEPRESSION     Abdominal pain LLQ: New onset; send urine culture; pelvic exam benign.  RTC for worsening. Obtain pelvic US.  Vaginal discharge: New onset; associated possibly with LLQ pain; obtain wet prep.    Hypercholesterolemia:  I recommend weight loss, exercise, and low-cholesterol low-fat food choices.  I recommend limiting red meat to once per week; I recommend limiting fried foods to once per month.  Refer to nutritionist.  BMI 27: Recommend weight loss, exercise for 30-60 minutes five days per week; recommend 1200  kcal restriction per day with a minimum of 60 grams of protein per day.  Eat 3 meals per day. Do not skip meals. Consider having a protein shake as a meal replacement to aid with eliminating meal skipping. Look for products with <220 calories, <7 gm sugar, and 20-30 gm protein.  Eat breakfast within 2 hours of getting up.   Make  your plate non-starchy vegetables,  protein, and  carbohydrates at lunch and dinner.   Aim for at least 64 oz. of calorie-free beverages daily (water, Crystal Light, diet green tea, etc.). Eliminate any sugary beverages such as regular soda, sweet tea, or fruit juice.   Pay attention to hunger and fullness cues.  Stop eating once you feel satisfied; don't wait until you feel full, stuffed, or sick from eating.  Choose lean meats and low fat/fat free dairy products.  Choose foods high in fiber such as fruits, vegetables, and whole grains (brown rice, whole wheat pasta, whole wheat bread, etc.).  Limit foods with added sugar to <7 gm per serving.  Always eat in the kitchen/dining room.  Never eat in the bedroom or in front of the TV.  Fibromyalgia: Persistent; continue current medications; recommend regular exercise and good sleep hygiene.  Anxiety and depression: moderately controlled; continue Cymbalta 120mg  daily; highly recommend daily exercise for depression management; highly recommend psychotherapy yet patient declines at this time.  Cervical cancer screening: pap smear obtained.    Orders Placed  This Encounter  Procedures  . Urine Culture    Order Specific Question:   Source    Answer:   clean catch  . US Pelvis Complete    Standing Status:   Future    Standing Expiration Date:   07/12/2018    Order Specific Question:   Reason for Exam (SYMPTOM  OR DIAGNOSIS REQUIRED)    Answer:   L pelvic exam    Order Specific Question:   Preferred imaging location?    Answer:   [facility]  . CBC with Differential/Platelet  . Comprehensive  metabolic panel    Order Specific Question:   Has the patient fasted?    Answer:   No  . Lipid panel    Order Specific Question:   Has the patient fasted?    Answer:   No  . Amb ref to Medical Nutrition Therapy-MNT    Referral Priority:   Routine    Referral Type:   Consultation    Referral Reason:   Specialty Services Required    Requested Specialty:   Nutrition    Number of Visits Requested:   1  . POCT urinalysis dipstick  . POCT Wet + KOH Prep   No orders of the defined types were placed in this encounter.   Return in about 3 months (around 08/10/2017) for complete physical examiniation.   Stanislav Gervase Paulita Fujita, M.D. Primary Care at [facility] previously [facility] [address] [place], [place]  [address] [phone] phone [phone] fax

## 2017-05-12 LAB — URINE CULTURE: ORGANISM ID, BACTERIA: NO GROWTH

## 2017-05-14 LAB — PAP IG AND HPV HIGH-RISK
HPV, high-risk: NEGATIVE
PAP Smear Comment: 0

## 2017-05-18 ENCOUNTER — Other Ambulatory Visit: Payer: Self-pay | Admitting: Family Medicine

## 2017-05-18 MED ORDER — FLUCONAZOLE 150 MG PO TABS: 150.0000 mg | ORAL_TABLET | Freq: Once | ORAL | 0 refills | Status: AC

## 2017-05-21 ENCOUNTER — Other Ambulatory Visit: Payer: Self-pay | Admitting: Family Medicine

## 2017-05-21 ENCOUNTER — Other Ambulatory Visit: Payer: Self-pay

## 2017-05-21 ENCOUNTER — Encounter: Payer: Self-pay | Admitting: Emergency Medicine

## 2017-05-21 ENCOUNTER — Ambulatory Visit: Payer: Federal, State, Local not specified - PPO | Admitting: Emergency Medicine

## 2017-05-21 VITALS — BP 120/80 | HR 106 | Temp 98.0°F | Resp 16 | Ht 65.0 in | Wt 170.4 lb

## 2017-05-21 DIAGNOSIS — J029 Acute pharyngitis, unspecified: Secondary | ICD-10-CM

## 2017-05-21 MED ORDER — CETIRIZINE HCL 10 MG PO TABS: 10.0000 mg | ORAL_TABLET | Freq: Every day | ORAL | 11 refills | Status: DC

## 2017-05-21 MED ORDER — FLUTICASONE PROPIONATE 50 MCG/ACT NA SUSP: 2.0000 | Freq: Every day | NASAL | 6 refills | Status: AC

## 2017-05-21 MED ORDER — DOXYCYCLINE HYCLATE 100 MG PO TABS: 100.0000 mg | ORAL_TABLET | Freq: Two times a day (BID) | ORAL | 0 refills | Status: DC

## 2017-05-21 NOTE — Patient Instructions (Addendum)
     IF you received an x-ray today, you will receive an invoice from [facility]. Please contact [facility] at [phone] with questions or concerns regarding your invoice.   IF you received labwork today, you will receive an invoice from [facility]. Please contact [facility] at [phone] with questions or concerns regarding your invoice.   Our billing staff will not be able to assist you with questions regarding bills from these companies.  You will be contacted with the lab results as soon as they are available. The fastest way to get your results is to activate your My Chart account. Instructions are located on the last page of this paperwork. If you have not heard from us regarding the results in 2 weeks, please contact this office.     Pharyngitis Pharyngitis is a sore throat (pharynx). There is redness, pain, and swelling of your throat. Follow these instructions at home:  Drink enough fluids to keep your pee (urine) clear or pale yellow.  Only take medicine as told by your doctor.  You may get sick again if you do not take medicine as told. Finish your medicines, even if you start to feel better.  Do not take aspirin.  Rest.  Rinse your mouth (gargle) with salt water ( tsp of salt per 1 qt of water) every 1-2 hours. This will help the pain.  If you are not at risk for choking, you can suck on hard candy or sore throat lozenges. Contact a doctor if:  You have large, tender lumps on your neck.  You have a rash.  You cough up green, yellow-brown, or bloody spit. Get help right away if:  You have a stiff neck.  You drool or cannot swallow liquids.  You throw up (vomit) or are not able to keep medicine or liquids down.  You have very bad pain that does not go away with medicine.  You have problems breathing (not from a stuffy nose). This information is not intended to replace advice given to you by your health care provider. Make sure you  discuss any questions you have with your health care provider. Document Released: 06/24/2007 Document Revised: 06/13/2015 Document Reviewed: 09/12/2012 Elsevier Interactive Patient Education  2017 [employer]  

## 2017-05-21 NOTE — Progress Notes (Signed)
Mandy Diaz Junio 67 y.o.   Chief Complaint  Patient presents with  . Sore Throat    since yesterday - swollen glands per patient    HISTORY OF PRESENT ILLNESS: This is a 67 y.o. female complaining of sore throat and swollen glands for 2 days.  Sinuses also hurt.  Some fever and chills.  No other significant symptoms.  HPI   Prior to Admission medications   Medication Sig Start Date End Date Taking? Authorizing Provider  atenolol (TENORMIN) 25 MG tablet Take 1-2 tablets (25-50 mg total) by mouth daily. 12/25/16  Yes Ethelda Chick, MD  Cholecalciferol 2000 units CAPS Take 4,000 Units by mouth daily.   Yes [provider]  clidinium-chlordiazePOXIDE (LIBRAX) 5-2.5 MG capsule TAKE ONE CAPSULE BY MOUTH TWICE A DAY AS NEEDED 03/14/17  Yes Ethelda Chick, MD  cyanocobalamin (,VITAMIN B-12,) 1000 MCG/ML injection Inject 1 mL (1,000 mcg total) into the muscle every 14 (fourteen) days. Patient taking differently: Inject 1,000 mcg into the muscle every Friday.  02/23/14  Yes Ethelda Chick, MD  cyclobenzaprine (FLEXERIL) 5 MG tablet Take 1 tablet (5 mg total) by mouth daily as needed for muscle spasms. 12/21/16  Yes Ethelda Chick, MD  estradiol (ESTRACE) 0.5 MG tablet Take 1 tablet (0.5 mg total) by mouth daily. Patient taking differently: Take 1 mg by mouth daily.  06/17/16  Yes Ethelda Chick, MD  Magnesium Malate POWD Take 3,900 mg by mouth daily. TAKES 3 x  CAPSULES ONCE DAILY   Yes [provider]  NON FORMULARY Take 150 mg by mouth every morning. PROGENOLONE (for memory and hormonal balance).   Yes [provider]  omeprazole (PRILOSEC) 20 MG capsule TAKE 1 CAPSULE (20 MG TOTAL) BY MOUTH DAILY. 11/09/16  Yes Ethelda Chick, MD  progesterone (PROMETRIUM) 200 MG capsule  08/19/16  Yes [provider]  thyroid (ARMOUR) 65 MG tablet Take 65 mg by mouth daily.   Yes [provider]  traZODone (DESYREL) 50 MG tablet TAKE 1 TABLET BY MOUTH AT BEDTIME  AS NEEDED FOR SLEEP. 04/30/17  Yes Ethelda Chick, MD  valACYclovir (VALTREX) 500 MG tablet Take 1 tablet (500 mg total) by mouth 3 (three) times daily. For viral infection 01/25/15  Yes Ethelda Chick, MD  butalbital-acetaminophen-caffeine (FIORICET, ESGIC) [phone] MG tablet Take 1 tablet by mouth every 6 (six) hours as needed for headache. Patient not taking: Reported on 05/21/2017 09/18/16   Ethelda Chick, MD  NON FORMULARY Take 2 capsules by mouth daily. ALL ADRENAL RAW.     [provider]    Allergies  Allergen Reactions  . Metoclopramide Hcl Other (See Comments)    REACTION: Affected nerves  . Sulfamethoxazole Other (See Comments)    REACTION: Sores in mouth  . Zofran [Ondansetron Hcl]   . Amoxicillin Itching and Rash    REACTION: Rash  . Penicillins Rash    Patient Active Problem List   Diagnosis Date Noted  . Benign paroxysmal positional vertigo due to bilateral vestibular disorder 09/24/2016  . Frequent falls 09/24/2016  . ANA positive 10/22/2015  . Acne rosacea 10/26/2014  . Fibromyalgia 04/09/2013  . Insomnia 04/09/2013  . Microscopic hematuria 02/19/2012  . Eczema 02/17/2012  . Vitamin D deficiency 03/29/2011  . CAROTID ARTERY DISEASE 12/18/2009  . Migraine 09/03/2009  . APHTHOUS STOMATITIS 09/03/2009  . Disorder of bone and cartilage 04/18/2009  . Tachycardia 11/28/2008  . Osteoarthritis of both hands 07/11/2008  . VITAMIN B12  DEFICIENCY 11/02/2007  . IRRITABLE BOWEL SYNDROME 10/06/2007  . ANXIETY DEPRESSION 12/28/2006  . GERD 09/09/2006    Past Medical History:  Diagnosis Date  . Anxiety   . Depression   . Fibromyalgia 12/2012   s/p rheumatology consultation to confirm diagnosis; no evidence of autoimmune process.  Marland Kitchen GERD (gastroesophageal reflux disease)   . Hypertension   . IBS (irritable bowel syndrome)   . Menopause   . Migraine   . Osteoarthritis   . Thyroid disease   . Urinary incontinence     Past Surgical History:  Procedure  Laterality Date  . CHOLECYSTECTOMY      Social History   Socioeconomic History  . Marital status: Married    Spouse name: Mandy Diaz"  . Number of children: 2  . Years of education: 12.5  . Highest education level: Not on file  Occupational History  . Not on file  Social Needs  . Financial resource strain: Not on file  . Food insecurity:    Worry: Not on file    Inability: Not on file  . Transportation needs:    Medical: Not on file    Non-medical: Not on file  Tobacco Use  . Smoking status: Never Smoker  . Smokeless tobacco: Never Used  Substance and Sexual Activity  . Alcohol use: No  . Drug use: No  . Sexual activity: Yes    Birth control/protection: None  Lifestyle  . Physical activity:    Days per week: Not on file    Minutes per session: Not on file  . Stress: Not on file  Relationships  . Social connections:    Talks on phone: Not on file    Gets together: Not on file    Attends religious service: Not on file    Active member of club or organization: Not on file    Attends meetings of clubs or organizations: Not on file    Relationship status: Not on file  . Intimate partner violence:    Fear of current or ex partner: Not on file    Emotionally abused: Not on file    Physically abused: Not on file    Forced sexual activity: Not on file  Other Topics Concern  . Not on file  Social History Narrative   Lives with husband Mandy Diaz   Caffeine use: Drinks coffee every morning (1 cup)      Regular exercise - NO       Family History  Problem Relation Age of Onset  . Stroke Mother   . Tremor Mother   . Cancer Mother        Lung cancer; minimal smoker  . Cancer Father        Liver cancer  . Heart disease Father   . Diabetes Brother   . Heart disease Brother   . Neuropathy Brother   . Heart disease Daughter   . Aneurysm Paternal Grandfather   . Arthritis Unknown   . Diabetes Unknown        1st degree relative  . Hyperlipidemia Unknown   .  Stroke Unknown        Female 1st degree relative <50  . Heart disease Unknown   . Breast cancer Neg Hx      Review of Systems  Constitutional: Positive for chills and fever.  HENT: Positive for congestion, ear pain and sore throat.   Eyes: Negative.  Negative for blurred vision and double vision.  Respiratory: Positive for cough.  Negative for sputum production, shortness of breath and wheezing.   Cardiovascular: Negative.  Negative for chest pain and palpitations.  Gastrointestinal: Negative.  Negative for abdominal pain, diarrhea, nausea and vomiting.  Genitourinary: Negative.  Negative for dysuria and hematuria.  Musculoskeletal: Negative for back pain, myalgias and neck pain.  Skin: Negative for rash.  Neurological: Negative.  Negative for dizziness and headaches.  Endo/Heme/Allergies: Negative.   All other systems reviewed and are negative.   Vitals:   05/21/17 1554  BP: 120/80  Pulse: (!) 106  Resp: 16  Temp: 98 F (36.7 C)  SpO2: 97%    Physical Exam  Constitutional: She is oriented to person, place, and time. She appears well-developed and well-nourished.  HENT:  Head: Normocephalic and atraumatic.  Right Ear: Tympanic membrane, external ear and ear canal normal.  Left Ear: Tympanic membrane, external ear and ear canal normal.  Nose: Right sinus exhibits maxillary sinus tenderness. Left sinus exhibits no maxillary sinus tenderness.  Mouth/Throat: Uvula is midline. Posterior oropharyngeal erythema present. No oropharyngeal exudate or tonsillar abscesses.  Eyes: Pupils are equal, round, and reactive to light. Conjunctivae and EOM are normal.  Neck: Normal range of motion. Neck supple. No thyromegaly present.  Cardiovascular: Normal rate, regular rhythm and normal heart sounds.  Pulmonary/Chest: Effort normal and breath sounds normal.  Musculoskeletal: Normal range of motion. She exhibits no edema or tenderness.  Lymphadenopathy:    She has no cervical adenopathy.    Neurological: She is alert and oriented to person, place, and time. No sensory deficit. She exhibits normal muscle tone.  Skin: Skin is warm and dry. Capillary refill takes less than 2 seconds. No rash noted.  Psychiatric: She has a normal mood and affect. Her behavior is normal.  Vitals reviewed.    ASSESSMENT & PLAN: Amica was seen today for sore throat.  Diagnoses and all orders for this visit:  Sore throat -     doxycycline (VIBRA-TABS) 100 MG tablet; Take 1 tablet (100 mg total) by mouth 2 (two) times daily. -     fluticasone (FLONASE) 50 MCG/ACT nasal spray; Place 2 sprays into both nostrils daily. -     cetirizine (ZYRTEC) 10 MG tablet; Take 1 tablet (10 mg total) by mouth daily.  Acute pharyngitis, unspecified etiology -     doxycycline (VIBRA-TABS) 100 MG tablet; Take 1 tablet (100 mg total) by mouth 2 (two) times daily. -     fluticasone (FLONASE) 50 MCG/ACT nasal spray; Place 2 sprays into both nostrils daily. -     cetirizine (ZYRTEC) 10 MG tablet; Take 1 tablet (10 mg total) by mouth daily.    Patient Instructions       IF you received an x-ray today, you will receive an invoice from [facility]. Please contact [facility] at [phone] with questions or concerns regarding your invoice.   IF you received labwork today, you will receive an invoice from [facility]. Please contact [facility] at [phone] with questions or concerns regarding your invoice.   Our billing staff will not be able to assist you with questions regarding bills from these companies.  You will be contacted with the lab results as soon as they are available. The fastest way to get your results is to activate your My Chart account. Instructions are located on the last page of this paperwork. If you have not heard from [place] regarding the results in 2 weeks, please contact this office.     Pharyngitis Pharyngitis is a sore throat (pharynx).  There is redness, pain, and swelling  of your throat. Follow these instructions at home:  Drink enough fluids to keep your pee (urine) clear or pale yellow.  Only take medicine as told by your doctor. ? You may get sick again if you do not take medicine as told. Finish your medicines, even if you start to feel better. ? Do not take aspirin.  Rest.  Rinse your mouth (gargle) with salt water ( tsp of salt per 1 qt of water) every 1-2 hours. This will help the pain.  If you are not at risk for choking, you can suck on hard candy or sore throat lozenges. Contact a doctor if:  You have large, tender lumps on your neck.  You have a rash.  You cough up green, yellow-brown, or bloody spit. Get help right away if:  You have a stiff neck.  You drool or cannot swallow liquids.  You throw up (vomit) or are not able to keep medicine or liquids down.  You have very bad pain that does not go away with medicine.  You have problems breathing (not from a stuffy nose). This information is not intended to replace advice given to you by your health care provider. Make sure you discuss any questions you have with your health care provider. Document Released: 06/24/2007 Document Revised: 06/13/2015 Document Reviewed: 09/12/2012 Elsevier Interactive Patient Education  2017 Elsevier Inc.      Edwina Barth, MD [facility]

## 2017-05-25 ENCOUNTER — Telehealth: Payer: Self-pay | Admitting: Family Medicine

## 2017-05-25 DIAGNOSIS — E663 Overweight: Secondary | ICD-10-CM

## 2017-05-25 NOTE — Telephone Encounter (Signed)
Copied from CRM [id number]. Topic: Referral - Question >> May 25, 2017  2:52 PM Terisa Starr wrote: Reason for CRM: Pam from Diabetes & Nutrition department at [facility] called and said that they received a referral. It is a Z code in there for the diagnosis in there work que. They do not use Z code. They need it to be changed. It can be changed in epic to E66.3, that is for overweight.   Call back if needed [phone]  -----------------------------------------------------------------------------------------  Please advise. Thanks!

## 2017-05-30 ENCOUNTER — Other Ambulatory Visit: Payer: Self-pay | Admitting: Family Medicine

## 2017-06-07 ENCOUNTER — Other Ambulatory Visit: Payer: Self-pay | Admitting: Family Medicine

## 2017-06-08 NOTE — Telephone Encounter (Signed)
Patient called and asked is she still taking Cymbalta. She says yes, it helps with her fibromyalgia. She says "will you ask Dr. Katrinka Blazing for a recommendation for something for allergies besides Zyrtec? I was prescribed this for a sore throat on 05/21/17 by another doctor and it's not working for me."   Cymbalta refill and request for allergy medication Last refill:03/16/17 180 cap/0 refill Last OV:05/11/17 ZOX:[id number] Pharmacy: [id number] [place], [place] - [address] [phone] (Phone) [phone] (Fax)

## 2017-06-10 NOTE — Telephone Encounter (Signed)
Patient called and advised of the note by Dr. Katrinka Blazing on 06/10/17. I asked is she using Flonase, and she says yes. She verbalized understanding of the recommendation from Dr. Katrinka Blazing.

## 2017-06-10 NOTE — Telephone Encounter (Signed)
Call patient: I have sent in a refill of Cymbalta 2 tablets daily to patient's pharmacy.  Patient should continue taking Zyrtec.  She should also be taking Flonase nasal spray.  Is she currently taking Flonase for her allergy symptoms?

## 2017-06-15 ENCOUNTER — Encounter: Payer: Self-pay | Admitting: Family Medicine

## 2017-06-19 ENCOUNTER — Other Ambulatory Visit: Payer: Self-pay | Admitting: Family Medicine

## 2017-06-20 ENCOUNTER — Other Ambulatory Visit: Payer: Self-pay | Admitting: Family Medicine

## 2017-06-22 NOTE — Telephone Encounter (Signed)
LOV 05/21/17 Dr. Katrinka BlazingSmith Last refill  12/21/16  # 90 with 1 refill

## 2017-06-23 NOTE — Telephone Encounter (Signed)
Please review for refills, atenolol 25 mg and escitalopram 5 mg. LOV  05/21/17 NOV  08/13/17 Provider Nilda SimmerKristi Smith, MD

## 2017-06-30 NOTE — Telephone Encounter (Signed)
Lexapro discontinued; called pharmacy to delete refill for Lexapro.

## 2017-06-30 NOTE — Telephone Encounter (Signed)
Incomplete documentation from 05/11/17 visit with Dr. Katrinka BlazingSmith - no plan available. Office visit notes states patient had discontinued escitalopram and no information available on continuing atenolol. Provide, please review refills and sign if appropriate.

## 2017-06-30 NOTE — Telephone Encounter (Signed)
Patient is requesting a refill of the following medications: Requested Prescriptions   Pending Prescriptions Disp Refills  . cyclobenzaprine (FLEXERIL) 5 MG tablet [Pharmacy Med Name: CYCLOBENZAPRINE 5 MG TABLET] 90 tablet 1    Sig: TAKE 1 TABLET (5 MG TOTAL) BY MOUTH DAILY AS NEEDED FOR MUSCLE SPASMS.    Date of patient request: 06/19/17 Last office visit: 12/21/16  Date of last refill: 12/21/16 Last refill amount: #90 with 1 refill Follow up time period per chart: Return in about 3 months (around 03/21/2017) for follow-up chronic medical conditions. Seen twice in office since 12/21/16, discussion of Fibromyalgia not documented in either visit.

## 2017-08-06 ENCOUNTER — Other Ambulatory Visit: Payer: Self-pay | Admitting: Family Medicine

## 2017-08-11 ENCOUNTER — Other Ambulatory Visit: Payer: Self-pay

## 2017-08-11 ENCOUNTER — Encounter: Payer: Self-pay | Admitting: Family Medicine

## 2017-08-11 ENCOUNTER — Ambulatory Visit (INDEPENDENT_AMBULATORY_CARE_PROVIDER_SITE_OTHER): Payer: Federal, State, Local not specified - PPO | Admitting: Family Medicine

## 2017-08-11 VITALS — BP 126/82 | HR 116 | Temp 98.0°F | Resp 16 | Ht 65.35 in | Wt 170.0 lb

## 2017-08-11 DIAGNOSIS — H8113 Benign paroxysmal vertigo, bilateral: Secondary | ICD-10-CM | POA: Diagnosis not present

## 2017-08-11 DIAGNOSIS — F5104 Psychophysiologic insomnia: Secondary | ICD-10-CM

## 2017-08-11 DIAGNOSIS — G43709 Chronic migraine without aura, not intractable, without status migrainosus: Secondary | ICD-10-CM

## 2017-08-11 DIAGNOSIS — Z131 Encounter for screening for diabetes mellitus: Secondary | ICD-10-CM

## 2017-08-11 DIAGNOSIS — F341 Dysthymic disorder: Secondary | ICD-10-CM

## 2017-08-11 DIAGNOSIS — R000 Tachycardia, unspecified: Secondary | ICD-10-CM | POA: Diagnosis not present

## 2017-08-11 DIAGNOSIS — Z23 Encounter for immunization: Secondary | ICD-10-CM | POA: Diagnosis not present

## 2017-08-11 DIAGNOSIS — Z0001 Encounter for general adult medical examination with abnormal findings: Secondary | ICD-10-CM

## 2017-08-11 DIAGNOSIS — M797 Fibromyalgia: Secondary | ICD-10-CM | POA: Diagnosis not present

## 2017-08-11 DIAGNOSIS — Z0000 Encounter for general adult medical examination without abnormal findings: Secondary | ICD-10-CM

## 2017-08-11 DIAGNOSIS — M5136 Other intervertebral disc degeneration, lumbar region: Secondary | ICD-10-CM

## 2017-08-11 DIAGNOSIS — E7800 Pure hypercholesterolemia, unspecified: Secondary | ICD-10-CM

## 2017-08-11 LAB — POCT URINALYSIS DIP (MANUAL ENTRY)
Blood, UA: NEGATIVE
Glucose, UA: NEGATIVE mg/dL
LEUKOCYTES UA: NEGATIVE
Nitrite, UA: NEGATIVE
PH UA: 6 (ref 5.0–8.0)
SPEC GRAV UA: 1.025 (ref 1.010–1.025)
UROBILINOGEN UA: 0.2 U/dL

## 2017-08-11 MED ORDER — DULOXETINE HCL 60 MG PO CPEP: 120.0000 mg | ORAL_CAPSULE | Freq: Every day | ORAL | 1 refills | Status: DC

## 2017-08-11 MED ORDER — OMEPRAZOLE 20 MG PO CPDR: 20.0000 mg | DELAYED_RELEASE_CAPSULE | Freq: Every day | ORAL | 3 refills | Status: AC

## 2017-08-11 NOTE — Progress Notes (Signed)
Subjective:    Patient ID: Mandy Diaz, female    DOB: 09-01-1950, 67 y.o.   MRN: [id number]  08/11/2017  Annual Exam    HPI This 67 y.o. female presents for COMPLETE PHYSICAL EXAMINATION.  Last physical:  08-2015 Pap smear:  05-11-2017 Mammogram:  10-2016 Colonoscopy:  2014 Buccini; has been notified to schedule repeat colonoscopy.   Bone density:  08-2015   Visual Acuity Screening   Right eye Left eye Both eyes  Without correction: 20/30 20/30 20/30   With correction:       BP Readings from Last 3 Encounters:  08/11/17 126/82  05/21/17 120/80  05/11/17 130/82   Wt Readings from Last 3 Encounters:  08/11/17 170 lb (77.1 kg)  05/21/17 170 lb 6.4 oz (77.3 kg)  05/11/17 169 lb (76.7 kg)   Immunization History  Administered Date(s) Administered  . Influenza Split 12/24/2011  . Influenza Whole 11/28/2008  . Influenza,inj,Quad PF,6+ Mos 10/28/2012, 01/24/2014, 10/19/2014, 09/16/2016  . Pneumococcal Conjugate-13 08/11/2017  . Pneumococcal Polysaccharide-23 10/28/2012  . Td 11/02/2007, 07/15/2010, 07/16/2010   Health Maintenance  Topic Date Due  . INFLUENZA VACCINE  08/19/2017  . PNA vac Low Risk Adult (2 of 2 - PPSV23) 08/12/2018  . MAMMOGRAM  10/15/2018  . TETANUS/TDAP  07/15/2020  . COLONOSCOPY  03/31/2022  . DEXA SCAN  Completed  . Hepatitis C Screening  Completed    Fibromyalgia: all of these pains cannot be fibromyalgia.  S/p evaluation by Dr. [name] of rheumatology.   No scheduled follow-up with Dr. [name].    Charlie horses in toes and in calves and thighs and anterior thighs.  Drinking plenty of water; not stretching.  B knees; everywhere.  Has taken patient over.   Hands hurt.   DDD lumbar spine: two discs are bad.  Not sure if needs to go for a massage or chiropractor.  Had a girlfriend who saw Rodman Pickle, MD NS in [place].     Review of Systems  Constitutional: Negative for activity change, appetite change, chills, diaphoresis, fatigue, fever and unexpected  weight change.  HENT: Negative for congestion, dental problem, drooling, ear discharge, ear pain, facial swelling, hearing loss, mouth sores, nosebleeds, postnasal drip, rhinorrhea, sinus pressure, sneezing, sore throat, tinnitus, trouble swallowing and voice change.   Eyes: Negative for photophobia, pain, discharge, redness, itching and visual disturbance.  Respiratory: Negative for apnea, cough, choking, chest tightness, shortness of breath, wheezing and stridor.   Cardiovascular: Negative for chest pain, palpitations and leg swelling.  Gastrointestinal: Positive for constipation and diarrhea. Negative for abdominal distention, abdominal pain, anal bleeding, blood in stool, nausea, rectal pain and vomiting.  Endocrine: Negative for cold intolerance, heat intolerance, polydipsia, polyphagia and polyuria.  Genitourinary: Negative for decreased urine volume, difficulty urinating, dyspareunia, dysuria, enuresis, flank pain, frequency, genital sores, hematuria, menstrual problem, pelvic pain, urgency, vaginal bleeding, vaginal discharge and vaginal pain.  Musculoskeletal: Positive for arthralgias and back pain. Negative for gait problem, joint swelling, myalgias, neck pain and neck stiffness.  Skin: Negative for color change, pallor, rash and wound.  Allergic/Immunologic: Negative for environmental allergies, food allergies and immunocompromised state.  Neurological: Negative for dizziness, tremors, seizures, syncope, facial asymmetry, speech difficulty, weakness, light-headedness, numbness and headaches.  Hematological: Negative for adenopathy. Does not bruise/bleed easily.  Psychiatric/Behavioral: Positive for dysphoric mood. Negative for agitation, behavioral problems, confusion, decreased concentration, hallucinations, self-injury, sleep disturbance and suicidal ideas. The patient is nervous/anxious. The patient is not hyperactive.     Past Medical History:  Diagnosis  Date  . Anxiety   .  Depression   . Fibromyalgia 12/2012   s/p rheumatology consultation to confirm diagnosis; no evidence of autoimmune process.  Marland Kitchen GERD (gastroesophageal reflux disease)   . Hypertension   . IBS (irritable bowel syndrome)   . Menopause   . Migraine   . Osteoarthritis   . Thyroid disease   . Urinary incontinence    Past Surgical History:  Procedure Laterality Date  . CHOLECYSTECTOMY     Allergies  Allergen Reactions  . Metoclopramide Hcl Other (See Comments)    REACTION: Affected nerves  . Sulfamethoxazole Other (See Comments)    REACTION: Sores in mouth  . Zofran [Ondansetron Hcl]   . Amoxicillin Itching and Rash    REACTION: Rash  . Penicillins Rash   Current Outpatient Medications on File Prior to Visit  Medication Sig Dispense Refill  . atenolol (TENORMIN) 25 MG tablet TAKE 1 TO 2 TABLETS BY MOUTH DAILY 180 tablet 1  . butalbital-acetaminophen-caffeine (FIORICET, ESGIC) 50-325-40 MG tablet Take 1 tablet by mouth every 6 (six) hours as needed for headache. 40 tablet 0  . cetirizine (ZYRTEC) 10 MG tablet Take 1 tablet (10 mg total) by mouth daily. 30 tablet 11  . Cholecalciferol 2000 units CAPS Take 4,000 Units by mouth daily.    . cyanocobalamin (,VITAMIN B-12,) 1000 MCG/ML injection Inject 1 mL (1,000 mcg total) into the muscle every 14 (fourteen) days. (Patient taking differently: Inject 1,000 mcg into the muscle every Friday. ) 6 mL 3  . cyclobenzaprine (FLEXERIL) 5 MG tablet TAKE 1 TABLET (5 MG TOTAL) BY MOUTH DAILY AS NEEDED FOR MUSCLE SPASMS. 90 tablet 1  . estradiol (ESTRACE) 1 MG tablet     . fluticasone (FLONASE) 50 MCG/ACT nasal spray Place 2 sprays into both nostrils daily. 16 g 6  . Magnesium Malate POWD Take 3,900 mg by mouth daily. TAKES 3 x 1300MG  CAPSULES ONCE DAILY    . NON FORMULARY Take 150 mg by mouth every morning. PROGENOLONE (for memory and hormonal balance).    . NON FORMULARY Take 2 capsules by mouth daily. ALL ADRENAL RAW.     . progesterone  (PROMETRIUM) 200 MG capsule     . thyroid (ARMOUR) 65 MG tablet Take 65 mg by mouth daily.    . traZODone (DESYREL) 50 MG tablet TAKE 1 TABLET BY MOUTH EVERY DAY AT BEDTIME AS NEEDED FOR SLEEP 90 tablet 1   No current facility-administered medications on file prior to visit.    Social History   Socioeconomic History  . Marital status: Married    Spouse name: [name]"  . Number of children: 2  . Years of education: 12.5  . Highest education level: Not on file  Occupational History  . Not on file  Social Needs  . Financial resource strain: Not on file  . Food insecurity:    Worry: Not on file    Inability: Not on file  . Transportation needs:    Medical: Not on file    Non-medical: Not on file  Tobacco Use  . Smoking status: Never Smoker  . Smokeless tobacco: Never Used  Substance and Sexual Activity  . Alcohol use: No  . Drug use: No  . Sexual activity: Yes    Birth control/protection: None, Post-menopausal  Lifestyle  . Physical activity:    Days per week: Not on file    Minutes per session: Not on file  . Stress: Not on file  Relationships  . Social  connections:    Talks on phone: Not on file    Gets together: Not on file    Attends religious service: Not on file    Active member of club or organization: Not on file    Attends meetings of clubs or organizations: Not on file    Relationship status: Not on file  . Intimate partner violence:    Fear of current or ex partner: Not on file    Emotionally abused: Not on file    Physically abused: Not on file    Forced sexual activity: Not on file  Other Topics Concern  . Not on file  Social History Narrative   Marital status: married since 1971 to [place] Saia      Lives with husband.      Children: 2 children; 2 grandchildren.      Employment: unemployed since 2014      Tobacco: never      Alcohol: never      Exercise: none      Caffeine use: Drinks coffee every morning (1 cup)         Family History    Problem Relation Age of Onset  . Stroke Mother   . Tremor Mother   . Cancer Mother        Lung cancer; minimal smoker  . Cancer Father        Liver cancer  . Heart disease Father        CABG  . Diabetes Brother   . Heart disease Brother 24       AMI  . Neuropathy Brother   . Heart disease Daughter        HOCM  . Aneurysm Paternal Grandfather   . Arthritis Unknown   . Diabetes Unknown        1st degree relative  . Hyperlipidemia Unknown   . Stroke Unknown        Female 1st degree relative <50  . Heart disease Unknown   . Breast cancer Neg Hx        Objective:    BP 126/82   Pulse (!) 116   Temp 98 F (36.7 C) (Oral)   Resp 16   Ht 5' 5.35" (1.66 m)   Wt 170 lb (77.1 kg)   SpO2 100%   BMI 27.98 kg/m  Physical Exam  Constitutional: She is oriented to person, place, and time. She appears well-developed and well-nourished. No distress.  HENT:  Head: Normocephalic and atraumatic.  Right Ear: External ear normal.  Left Ear: External ear normal.  Nose: Nose normal.  Mouth/Throat: Oropharynx is clear and moist.  Eyes: Pupils are equal, round, and reactive to light. Conjunctivae and EOM are normal.  Neck: Normal range of motion and full passive range of motion without pain. Neck supple. No JVD present. Carotid bruit is not present. No thyromegaly present.  Cardiovascular: Normal rate, regular rhythm, normal heart sounds and intact distal pulses. Exam reveals no gallop and no friction rub.  No murmur heard. Pulmonary/Chest: Effort normal and breath sounds normal. She has no wheezes. She has no rales. Right breast exhibits no inverted nipple, no mass, no nipple discharge, no skin change and no tenderness. Left breast exhibits no inverted nipple, no mass, no nipple discharge, no skin change and no tenderness. No breast swelling, tenderness, discharge or bleeding. Breasts are symmetrical.  Abdominal: Soft. Bowel sounds are normal. She exhibits no distension and no mass. There is  no tenderness. There is no rebound  and no guarding.  Musculoskeletal:       Right shoulder: Normal.       Left shoulder: Normal.       Cervical back: Normal.  Lymphadenopathy:    She has no cervical adenopathy.  Neurological: She is alert and oriented to person, place, and time. She has normal reflexes. No cranial nerve deficit. She exhibits normal muscle tone. Coordination normal.  Skin: Skin is warm and dry. No rash noted. She is not diaphoretic. No erythema. No pallor.  Psychiatric: She has a normal mood and affect. Her behavior is normal. Judgment and thought content normal.  Nursing note and vitals reviewed.  No results found. Depression screen [facility] 2/9 08/11/2017 05/21/2017 05/11/2017 12/21/2016 09/16/2016  Decreased Interest 2 0 0 0 1  Down, Depressed, Hopeless 2 0 0 0 1  PHQ - 2 Score 4 0 0 0 2  Altered sleeping 1 - - - 0  Tired, decreased energy 1 - - - 1  Change in appetite 0 - - - 0  Feeling bad or failure about yourself  1 - - - 1  Trouble concentrating 0 - - - 1  Moving slowly or fidgety/restless 0 - - - 0  Suicidal thoughts 0 - - - 0  PHQ-9 Score 7 - - - 5   Fall Risk  08/11/2017 05/21/2017 05/11/2017 12/21/2016 09/16/2016  Falls in the past year? No No No No Yes  Number falls in past yr: - - - - 2 or more  Injury with Fall? - - - - Yes    Functional Status Survey:       Assessment & Plan:   1. Routine physical examination   2. ANXIETY DEPRESSION   3. Fibromyalgia   4. Psychophysiological insomnia   5. 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 Chronic migraine without aura without status migrainosus, not intractable   7. Screening for diabetes mellitus   8. Pure hypercholesterolemia   9. Need for pneumococcal vaccination   10. Tachycardia   11. Degenerative disc disease, lumbar     -anticipatory guidance provided --- exercise, weight loss, safe driving practices, aspirin 81mg  daily, calcium 600mg  twice daily. -obtain age  appropriate screening labs and labs for chronic disease management. -s/p Prevnar 13 -Degenerative disc disease of lumbar spine: Worsening pain in lower back.  Recommend repeat x-ray and follow-up with orthopedics.  Patient declines x-ray today but agreeable to orthopedic consultation. Fibromyalgia: Patient reports worsening allover pain.  Recommend follow-up with Dr. [name] of rheumatology.  Also recommend physical therapy to help with range of motion.  Patient declined referral to physical therapy.  Suggested a trial of yoga at a senior citizen facility or water aerobics. Anxiety and depression: Persistent and moderately controlled.  Patient has declined referral to psychiatry and psychology on multiple occasions.  Refill Cymbalta at current doses.  Recommend exercise for stress, depression, anxiety management.    Orders Placed This Encounter  Procedures  . Pneumococcal conjugate vaccine 13-valent IM  . CBC with Differential/Platelet  . Comprehensive metabolic panel    Order Specific Question:   Has the patient fasted?    Answer:   No  . Hemoglobin A1c  . Lipid panel    Order Specific Question:   Has the patient fasted?    Answer:   No  . TSH  . VITAMIN D 25 Hydroxy (Vit-D Deficiency, Fractures)  . Vitamin B12  . Ambulatory referral to Orthopedic Surgery    Referral Priority:  Routine    Referral Type:   Surgical    Referral Reason:   Specialty Services Required    Requested Specialty:   Orthopedic Surgery    Number of Visits Requested:   1  . POCT urinalysis dipstick  . EKG 12-Lead   Meds ordered this encounter  Medications  . DULoxetine (CYMBALTA) 60 MG capsule    Sig: Take 2 capsules (120 mg total) by mouth daily.    Dispense:  180 capsule    Refill:  1  . omeprazole (PRILOSEC) 20 MG capsule    Sig: Take 1 capsule (20 mg total) by mouth daily.    Dispense:  90 capsule    Refill:  3  . clidinium-chlordiazePOXIDE (LIBRAX) 5-2.5 MG capsule    Sig: Take 1 capsule by mouth 2  (two) times daily as needed.    Dispense:  6 capsule    Return in about 3 months (around 11/11/2017) for follow-up chronic medical conditions [place].   Abbagail Scaff Paulita FujitaMartin Kylle Lall, M.D. Primary [facility] at [facility]  [place] [facility] [address] [place], [place]  [id number] [phone] phone [phone] fax

## 2017-08-11 NOTE — Patient Instructions (Addendum)
Please call Dr. Cristina Gong to schedule your colonoscopy. Please call Dr. Dossie Der for a follow-up appointment.     IF you received an x-ray today, you will receive an invoice from [facility]. Please contact [facility] at [phone] with questions or concerns regarding your invoice.   IF you received labwork today, you will receive an invoice from [place]. Please contact [facility] at [phone] with questions or concerns regarding your invoice.   Our billing staff will not be able to assist you with questions regarding bills from these companies.  You will be contacted with the lab results as soon as they are available. The fastest way to get your results is to activate your My Chart account. Instructions are located on the last page of this paperwork. If you have not heard from [place] regarding the results in 2 weeks, please contact this office.      Preventive Care 66 Years and Older, Female Preventive care refers to lifestyle choices and visits with your health care provider that can promote health and wellness. What does preventive care include?  A yearly physical exam. This is also called an annual well check.  Dental exams once or twice a year.  Routine eye exams. Ask your health care provider how often you should have your eyes checked.  Personal lifestyle choices, including: ? Daily care of your teeth and gums. ? Regular physical activity. ? Eating a healthy diet. ? Avoiding tobacco and drug use. ? Limiting alcohol use. ? Practicing safe sex. ? Taking low-dose aspirin every day. ? Taking vitamin and mineral supplements as recommended by your health care provider. What happens during an annual well check? The services and screenings done by your health care provider during your annual well check will depend on your age, overall health, lifestyle risk factors, and family history of disease. Counseling Your health care provider may ask you questions about  your:  Alcohol use.  Tobacco use.  Drug use.  Emotional well-being.  Home and relationship well-being.  Sexual activity.  Eating habits.  History of falls.  Memory and ability to understand (cognition).  Work and work Statistician.  Reproductive health.  Screening You may have the following tests or measurements:  Height, weight, and BMI.  Blood pressure.  Lipid and cholesterol levels. These may be checked every 5 years, or more frequently if you are over 67 years old.  Skin check.  Lung cancer screening. You may have this screening every year starting at age 71 if you have a 30-pack-year history of smoking and currently smoke or have quit within the past 15 years.  Fecal occult blood test (FOBT) of the stool. You may have this test every year starting at age 48.  Flexible sigmoidoscopy or colonoscopy. You may have a sigmoidoscopy every 5 years or a colonoscopy every 10 years starting at age 27.  Hepatitis C blood test.  Hepatitis B blood test.  Sexually transmitted disease (STD) testing.  Diabetes screening. This is done by checking your blood sugar (glucose) after you have not eaten for a while (fasting). You may have this done every 1-3 years.  Bone density scan. This is done to screen for osteoporosis. You may have this done starting at age 11.  Mammogram. This may be done every 1-2 years. Talk to your health care provider about how often you should have regular mammograms.  Talk with your health care provider about your test results, treatment options, and if necessary, the need for more tests. Vaccines Your health  care provider may recommend certain vaccines, such as:  Influenza vaccine. This is recommended every year.  Tetanus, diphtheria, and acellular pertussis (Tdap, Td) vaccine. You may need a Td booster every 10 years.  Varicella vaccine. You may need this if you have not been vaccinated.  Zoster vaccine. You may need this after age  51.  Measles, mumps, and rubella (MMR) vaccine. You may need at least one dose of MMR if you were born in 1957 or later. You may also need a second dose.  Pneumococcal 13-valent conjugate (PCV13) vaccine. One dose is recommended after age 49.  Pneumococcal polysaccharide (PPSV23) vaccine. One dose is recommended after age 71.  Meningococcal vaccine. You may need this if you have certain conditions.  Hepatitis A vaccine. You may need this if you have certain conditions or if you travel or work in places where you may be exposed to hepatitis A.  Hepatitis B vaccine. You may need this if you have certain conditions or if you travel or work in places where you may be exposed to hepatitis B.  Haemophilus influenzae type b (Hib) vaccine. You may need this if you have certain conditions.  Talk to your health care provider about which screenings and vaccines you need and how often you need them. This information is not intended to replace advice given to you by your health care provider. Make sure you discuss any questions you have with your health care provider. Document Released: 02/01/2015 Document Revised: 09/25/2015 Document Reviewed: 11/06/2014 Elsevier Interactive Patient Education  [employer].

## 2017-08-12 LAB — CBC WITH DIFFERENTIAL/PLATELET
Basophils Absolute: 0 10*3/uL (ref 0.0–0.2)
Basos: 0 %
EOS (ABSOLUTE): 0.1 10*3/uL (ref 0.0–0.4)
EOS: 1 %
HEMATOCRIT: 41.7 % (ref 34.0–46.6)
HEMOGLOBIN: 13.8 g/dL (ref 11.1–15.9)
IMMATURE GRANS (ABS): 0 10*3/uL (ref 0.0–0.1)
Immature Granulocytes: 0 %
LYMPHS ABS: 1 10*3/uL (ref 0.7–3.1)
LYMPHS: 12 %
MCH: 29.2 pg (ref 26.6–33.0)
MCHC: 33.1 g/dL (ref 31.5–35.7)
MCV: 88 fL (ref 79–97)
Monocytes Absolute: 0.5 10*3/uL (ref 0.1–0.9)
Monocytes: 6 %
NEUTROS ABS: 6.8 10*3/uL (ref 1.4–7.0)
Neutrophils: 81 %
Platelets: 293 10*3/uL (ref 150–450)
RBC: 4.73 x10E6/uL (ref 3.77–5.28)
RDW: 13.3 % (ref 12.3–15.4)
WBC: 8.4 10*3/uL (ref 3.4–10.8)

## 2017-08-12 LAB — LIPID PANEL
Chol/HDL Ratio: 4.9 ratio — ABNORMAL HIGH (ref 0.0–4.4)
Cholesterol, Total: 202 mg/dL — ABNORMAL HIGH (ref 100–199)
HDL: 41 mg/dL (ref >39)
LDL Calculated: 119 mg/dL — ABNORMAL HIGH (ref 0–99)
Triglycerides: 211 mg/dL — ABNORMAL HIGH (ref 0–149)
VLDL CHOLESTEROL CAL: 42 mg/dL — AB (ref 5–40)

## 2017-08-12 LAB — COMPREHENSIVE METABOLIC PANEL
ALBUMIN: 4.4 g/dL (ref 3.6–4.8)
ALT: 23 IU/L (ref 0–32)
AST: 24 IU/L (ref 0–40)
Albumin/Globulin Ratio: 1.8 (ref 1.2–2.2)
Alkaline Phosphatase: 74 IU/L (ref 39–117)
BUN / CREAT RATIO: 13 (ref 12–28)
BUN: 9 mg/dL (ref 8–27)
Bilirubin Total: 0.3 mg/dL (ref 0.0–1.2)
CHLORIDE: 101 mmol/L (ref 96–106)
CO2: 20 mmol/L (ref 20–29)
CREATININE: 0.72 mg/dL (ref 0.57–1.00)
Calcium: 8.9 mg/dL (ref 8.7–10.3)
GFR, EST AFRICAN AMERICAN: 101 mL/min/{1.73_m2} (ref >59)
GFR, EST NON AFRICAN AMERICAN: 88 mL/min/{1.73_m2} (ref >59)
GLOBULIN, TOTAL: 2.4 g/dL (ref 1.5–4.5)
GLUCOSE: 95 mg/dL (ref 65–99)
Potassium: 4.1 mmol/L (ref 3.5–5.2)
SODIUM: 139 mmol/L (ref 134–144)
TOTAL PROTEIN: 6.8 g/dL (ref 6.0–8.5)

## 2017-08-12 LAB — HEMOGLOBIN A1C
Est. average glucose Bld gHb Est-mCnc: 108 mg/dL
Hgb A1c MFr Bld: 5.4 % (ref 4.8–5.6)

## 2017-08-12 LAB — VITAMIN D 25 HYDROXY (VIT D DEFICIENCY, FRACTURES): Vit D, 25-Hydroxy: 49 ng/mL (ref 30.0–100.0)

## 2017-08-12 LAB — TSH: TSH: 2.13 u[IU]/mL (ref 0.450–4.500)

## 2017-08-12 LAB — VITAMIN B12: Vitamin B-12: 902 pg/mL (ref 232–1245)

## 2017-08-12 MED ORDER — CILIDINIUM-CHLORDIAZEPOXIDE 2.5-5 MG PO CAPS: 1.0000 | ORAL_CAPSULE | Freq: Two times a day (BID) | ORAL | Status: AC | PRN

## 2017-08-26 ENCOUNTER — Other Ambulatory Visit: Payer: Self-pay | Admitting: Family Medicine

## 2017-08-26 NOTE — Telephone Encounter (Signed)
Fioricet 50-325-40mg  refill Last Refill:09/18/16 #40 Last OV: 08/11/17 PCP: former pt of Dr. Katrinka BlazingSmith [facility] [address]

## 2017-08-26 NOTE — Telephone Encounter (Signed)
Copied from CRM #[id number]. Topic: Quick Communication - See Telephone Encounter °>> Aug 26, 2017 11:02 AM Julena Barbour, [place], NT wrote: °CRM for notification. See Telephone encounter for: 08/26/17. Patient called and states that she needs a refill of her butalbital-acetaminophen-caffeine (FIORICET, ESGIC) 50-325-40 MG tablet ° °[id number] [place] [place] - [address] [phone] (Phone) °[phone] (Fax) °

## 2017-08-30 NOTE — Telephone Encounter (Signed)
Patient called and states that she is completely out of this medicine .Marland Kitchen.Marland Kitchen.Marland Kitchen.Please call patient once this medicine is called in... CB# [phone]

## 2017-09-01 ENCOUNTER — Telehealth: Payer: Self-pay | Admitting: Family Medicine

## 2017-09-01 NOTE — Telephone Encounter (Signed)
Copied from CRM [id number]. Topic: Quick Communication - See Telephone Encounter >> Aug 26, 2017 11:02 AM Tamela OddiMartin, Mandy, NT wrote: CRM for notification. See Telephone encounter for: 08/26/17. Patient called and states that she needs a refill of her butalbital-acetaminophen-caffeine Marikay Alar(FIORICET, ESGIC) 50-325-40 MG tablet  [id number] [place], [place] - [address] [phone]) [phone] (Fax)

## 2017-09-01 NOTE — Telephone Encounter (Signed)
Please advise 

## 2017-09-02 MED ORDER — BUTALBITAL-APAP-CAFFEINE 50-325-40 MG PO TABS: 1.0000 | ORAL_TABLET | Freq: Four times a day (QID) | ORAL | 0 refills | Status: DC | PRN

## 2017-09-16 MED ORDER — BUTALBITAL-APAP-CAFFEINE 50-325-40 MG PO TABS: 1.0000 | ORAL_TABLET | Freq: Four times a day (QID) | ORAL | 3 refills | Status: AC | PRN

## 2017-09-16 NOTE — Telephone Encounter (Signed)
Medication sent in. 

## 2017-12-21 ENCOUNTER — Other Ambulatory Visit: Payer: Self-pay | Admitting: Family Medicine

## 2017-12-21 NOTE — Telephone Encounter (Signed)
Patient needs an appointment

## 2017-12-21 NOTE — Telephone Encounter (Signed)
Copied from CRM [id number]. Topic: Quick Communication - Rx Refill/Question >> Dec 21, 2017  2:18 PM [place] wrote: Medication: DULoxetine (CYMBALTA) 60 MG capsule / Pharmacy stated they faxed over refill requests and have not heard anything.  Has the patient contacted their pharmacy? Yes.   (Agent: If no, request that the patient contact the pharmacy for the refill.) (Agent: If yes, when and what did the pharmacy advise?)  Preferred Pharmacy (with phone number or street name): [id number]- [place], [place] - [address] [phone] (Phone) [phone] (Fax)    Agent: Please be advised that RX refills may take up to 3 business days. We ask that you follow-up with your pharmacy.

## 2017-12-21 NOTE — Telephone Encounter (Signed)
Spoke with Doyne Keelhanda at PCP / Advised LOV 7-19 with Dr. Katrinka BlazingSmith who is no longer at practice / Per Doyne Keelhanda, send it to the practice pool for consideration.  Patient may need to schedule and appointment.

## 2017-12-22 NOTE — Telephone Encounter (Signed)
Please advise 

## 2017-12-22 NOTE — Telephone Encounter (Signed)
Pt called to inquire about the status of this refill. Please advise pt if she needs to schedule OV. Stated she has officially run out on 12/21/17 CB# [phone]

## 2017-12-25 MED ORDER — DULOXETINE HCL 60 MG PO CPEP: 120.0000 mg | ORAL_CAPSULE | Freq: Every day | ORAL | 1 refills | Status: DC

## 2018-01-13 ENCOUNTER — Telehealth: Payer: Self-pay

## 2018-01-13 NOTE — Telephone Encounter (Signed)
Copied from CRM #[id number]. Topic: Appointment Scheduling - New Patient >> Jan 13, 2018  3:49 PM Johnson, Chaz E wrote: New patient has been scheduled for your office. Provider: Dr. Estela Hernandez  Date of Appointment: 01.10.2020  Pt was prescribed DULoxetine (CYMBALTA) 60 MG capsule from previous provider and only has enough tablets for 8 days left. Her New patient appt is 01.10.2020. Pt wants to know if she can be called in enough to get her to her appt with Dr. Hernandez or if someone could advise her if she should try to take just 1 tablet daily to get her to her appt date. / please advise    Route to department's PEC pool. 

## 2018-01-18 ENCOUNTER — Telehealth: Payer: Self-pay

## 2018-01-18 NOTE — Telephone Encounter (Signed)
Copied from CRM [id number]. Topic: Appointment Scheduling - New Patient >> Jan 13, 2018  3:49 PM Wyonia HoughJohnson, Chaz E wrote: New patient has been scheduled for your office. Provider: Dr. Peggye PittEstela Hernandez  Date of Appointment: 01.10.2020  Pt was prescribed DULoxetine (CYMBALTA) 60 MG capsule from previous provider and only has enough tablets for 8 days left. Her New patient appt is 01.10.2020. Pt wants to know if she can be called in enough to get her to her appt with Dr. Ardyth HarpsHernandez or if someone could advise her if she should try to take just 1 tablet daily to get her to her appt date. / please advise    Route to department's PEC pool.

## 2018-01-20 NOTE — Telephone Encounter (Signed)
Spoke with pharmacist and prescription is being filled today.

## 2018-01-20 NOTE — Telephone Encounter (Signed)
Ok to refill one month until appointment

## 2018-01-20 NOTE — Telephone Encounter (Signed)
See refill phone note.

## 2018-01-28 ENCOUNTER — Ambulatory Visit: Payer: Federal, State, Local not specified - PPO | Admitting: Internal Medicine

## 2018-01-28 ENCOUNTER — Encounter: Payer: Self-pay | Admitting: Internal Medicine

## 2018-01-28 ENCOUNTER — Encounter: Payer: Federal, State, Local not specified - PPO | Admitting: Internal Medicine

## 2018-01-28 ENCOUNTER — Encounter

## 2018-01-28 VITALS — BP 120/80 | HR 99 | Temp 99.3°F | Ht 66.0 in | Wt 180.3 lb

## 2018-01-28 DIAGNOSIS — Z23 Encounter for immunization: Secondary | ICD-10-CM | POA: Diagnosis not present

## 2018-01-28 DIAGNOSIS — F341 Dysthymic disorder: Secondary | ICD-10-CM | POA: Diagnosis not present

## 2018-01-28 DIAGNOSIS — M797 Fibromyalgia: Secondary | ICD-10-CM | POA: Diagnosis not present

## 2018-01-28 DIAGNOSIS — K580 Irritable bowel syndrome with diarrhea: Secondary | ICD-10-CM

## 2018-01-28 DIAGNOSIS — G43709 Chronic migraine without aura, not intractable, without status migrainosus: Secondary | ICD-10-CM

## 2018-01-28 DIAGNOSIS — F5104 Psychophysiologic insomnia: Secondary | ICD-10-CM

## 2018-01-28 MED ORDER — TRAMADOL HCL 50 MG PO TABS: 50.0000 mg | ORAL_TABLET | Freq: Two times a day (BID) | ORAL | 0 refills | Status: AC

## 2018-01-28 MED ORDER — DULOXETINE HCL 60 MG PO CPEP: 120.0000 mg | ORAL_CAPSULE | Freq: Every day | ORAL | 1 refills | Status: DC

## 2018-01-28 MED ORDER — PROMETHAZINE HCL 12.5 MG PO TABS: ORAL_TABLET | ORAL | 0 refills | Status: DC

## 2018-01-28 NOTE — Patient Instructions (Signed)
-  It was nice meeting you today!  -Tramadol twice daily as needed for pain.  -Flu shot today.  -I believe you would benefit from a referral to psychiatry so I will arrange. You are already at the maximal doses of Cymbalta.

## 2018-01-28 NOTE — Progress Notes (Signed)
Established Patient Office Visit     CC/Reason for Visit: Establish care, follow-up chronic medical conditions  HPI: Mandy Diaz is a 68 y.o. female who is coming in today for the above mentioned reasons. Past Medical History is significant for: Significant anxiety and depression, fibromyalgia, migraine headaches issues.  She has multiple complaints today: Sinus pressure and drainage that has been ongoing for 2 weeks despite over-the-counter management, would like refill of her Phenergan that she uses for nausea when she has migraine headaches, her fibromyalgia has been "acting up" and she would like something for that.  She has tried tramadol in the past with relief.  She stills feels depressed, feels like she has gained too much weight, does not feel attractive, has lost the "zest for life".  Does not feel like a therapist would be helpful as she has tried in the past multiple family members have battled with depression including children, grandchildren, sisters.  She sees Dr. [name] with rheumatology for fibromyalgia.   Past Medical/Surgical History: Past Medical History:  Diagnosis Date  . Anxiety   . Depression   . Fibromyalgia 12/2012   s/p rheumatology consultation to confirm diagnosis; no evidence of autoimmune process.  Marland Kitchen GERD (gastroesophageal reflux disease)   . Hypertension   . IBS (irritable bowel syndrome)   . Menopause   . Migraine   . Osteoarthritis   . Thyroid disease   . Urinary incontinence     Past Surgical History:  Procedure Laterality Date  . CHOLECYSTECTOMY      Social History:  reports that she has never smoked. She has never used smokeless tobacco. She reports that she does not drink alcohol or use drugs.  Allergies: Allergies  Allergen Reactions  . Metoclopramide Hcl Other (See Comments)    REACTION: Affected nerves  . Sulfamethoxazole Other (See Comments)    REACTION: Sores in mouth  . Gabapentin     Sharpe pain in head  . Zofran  [Ondansetron Hcl]   . Penicillins Rash    Family History:  Family History  Problem Relation Age of Onset  . Stroke Mother   . Tremor Mother   . Cancer Mother        Lung cancer; minimal smoker  . Cancer Father        Liver cancer  . Heart disease Father        CABG  . Diabetes Brother   . Heart disease Brother 10       AMI  . Neuropathy Brother   . Heart disease Daughter        HOCM  . Aneurysm Paternal Grandfather   . Arthritis Unknown   . Diabetes Unknown        1st degree relative  . Hyperlipidemia Unknown   . Stroke Unknown        Female 1st degree relative <50  . Heart disease Unknown   . Breast cancer Neg Hx      Current Outpatient Medications:  .  atenolol (TENORMIN) 25 MG tablet, TAKE 1 TO 2 TABLETS BY MOUTH DAILY, Disp: 180 tablet, Rfl: 1 .  butalbital-acetaminophen-caffeine (FIORICET, ESGIC) 50-325-40 MG tablet, Take 1 tablet by mouth every 6 (six) hours as needed for headache. Needs office visit, Disp: 40 tablet, Rfl: 3 .  cetirizine (ZYRTEC) 10 MG tablet, Take 1 tablet (10 mg total) by mouth daily., Disp: 30 tablet, Rfl: 11 .  Cholecalciferol 2000 units CAPS, Take 4,000 Units by mouth daily., Disp: , Rfl:  .  clidinium-chlordiazePOXIDE (LIBRAX) 5-2.5 MG capsule, Take 1 capsule by mouth 2 (two) times daily as needed., Disp: 6 capsule, Rfl:  .  cyanocobalamin (,VITAMIN B-12,) 1000 MCG/ML injection, Inject 1 mL (1,000 mcg total) into the muscle every 14 (fourteen) days. (Patient taking differently: Inject 1,000 mcg into the muscle every 7 (seven) weeks. ), Disp: 6 mL, Rfl: 3 .  cyclobenzaprine (FLEXERIL) 5 MG tablet, TAKE 1 TABLET (5 MG TOTAL) BY MOUTH DAILY AS NEEDED FOR MUSCLE SPASMS., Disp: 90 tablet, Rfl: 1 .  DULoxetine (CYMBALTA) 60 MG capsule, Take 2 capsules (120 mg total) by mouth daily., Disp: 180 capsule, Rfl: 1 .  estradiol (ESTRACE) 1 MG tablet, , Disp: , Rfl:  .  fluticasone (FLONASE) 50 MCG/ACT nasal spray, Place 2 sprays into both nostrils daily.,  Disp: 16 g, Rfl: 6 .  Magnesium Malate POWD, Take 3,900 mg by mouth daily. TAKES 3 x 1300MG  CAPSULES ONCE DAILY, Disp: , Rfl:  .  NON FORMULARY, Take 150 mg by mouth every morning. PROGENOLONE (for memory and hormonal balance)., Disp: , Rfl:  .  omeprazole (PRILOSEC) 20 MG capsule, Take 1 capsule (20 mg total) by mouth daily., Disp: 90 capsule, Rfl: 3 .  progesterone (PROMETRIUM) 200 MG capsule, , Disp: , Rfl:  .  promethazine (PHENERGAN) 12.5 MG tablet, Take one tab daily as needed, Disp: 30 tablet, Rfl: 0 .  thyroid (ARMOUR) 65 MG tablet, Take 65 mg by mouth daily., Disp: , Rfl:  .  traZODone (DESYREL) 50 MG tablet, TAKE 1 TABLET BY MOUTH EVERY DAY AT BEDTIME AS NEEDED FOR SLEEP, Disp: 90 tablet, Rfl: 1 .  traMADol (ULTRAM) 50 MG tablet, Take 1 tablet (50 mg total) by mouth 2 (two) times daily., Disp: 60 tablet, Rfl: 0  Review of Systems:  Constitutional: Denies fever, chills, diaphoresis, appetite change and fatigue.  HEENT: Denies photophobia, eye pain, redness, hearing loss, ear pain, congestion, sore throat, rhinorrhea, sneezing, mouth sores, trouble swallowing, neck pain, neck stiffness and tinnitus.   Respiratory: Denies SOB, DOE, cough, chest tightness,  and wheezing.   Cardiovascular: Denies chest pain, palpitations and leg swelling.  Gastrointestinal: Denies nausea, vomiting, abdominal pain, diarrhea, constipation, blood in stool and abdominal distention.  Genitourinary: Denies dysuria, urgency, frequency, hematuria, flank pain and difficulty urinating.  Endocrine: Denies: hot or cold intolerance, sweats, changes in hair or nails, polyuria, polydipsia. Musculoskeletal: Denies myalgias, back pain, joint swelling, arthralgias and gait problem.  Skin: Denies pallor, rash and wound.  Neurological: Denies dizziness, seizures, syncope, weakness, light-headedness, numbness and headaches.  Hematological: Denies adenopathy. Easy bruising, personal or family bleeding history    Psychiatric/Behavioral: Denies suicidal ideation, mood changes, confusion, nervousness, sleep disturbance and agitation    Physical Exam: Vitals:   01/28/18 1617  BP: 120/80  Pulse: 99  Temp: 99.3 F (37.4 C)  TempSrc: Oral  SpO2: 97%  Weight: 180 lb 4.8 oz (81.8 kg)  Height: 5\' 6"  (1.676 m)    Body mass index is 29.1 kg/m.   Constitutional: NAD, calm, comfortable Eyes: PERRL, lids and conjunctivae normal ENMT: Mucous membranes are moist. Posterior pharynx clear of any exudate or lesions. Normal dentition. Neck: normal, supple, no masses, no thyromegaly Respiratory: clear to auscultation bilaterally, no wheezing, no crackles. Normal respiratory effort. No accessory muscle use.  Cardiovascular: Regular rate and rhythm, no murmurs / rubs / gallops. No extremity edema. 2+ pedal pulses. No carotid bruits.  Abdomen: no tenderness, no masses palpated. No hepatosplenomegaly. Bowel sounds positive.  Musculoskeletal: no clubbing / cyanosis.  No joint deformity upper and lower extremities. Good ROM, no contractures. Normal muscle tone.  Skin: no rashes, lesions, ulcers. No induration Neurologic: CN 2-12 grossly intact. Sensation intact, DTR normal. Strength 5/5 in all 4.  Psychiatric: Normal judgment and insight. Alert and oriented x 3.  Mood is depressed.   Impression and Plan:  ANXIETY DEPRESSION  Fibromyalgia -She displays significant signs of depression today. -She hass scored a 9 on PHQ 9. -She denies suicidal ideation, is not interested in following with a therapist. -She is already on maximal doses of Cymbalta. -I believe she is quite a complex case and believe she would benefit from referral to psychiatry so I will arrange. -Will give her a 30-day prescription for tramadol for pain associated with fibromyalgia which she states has helped in the past.  Chronic migraine without aura without status migrainosus, not intractable -Will refill Phenergan today.  Irritable bowel  syndrome with diarrhea -Follows with Dr. Matthias HughsBuccini  Sinus complaints -Likely viral in origin.  Advised over-the-counter medications, saline nasal spray, decongestants, antihistamines. -She will notify us if no improvement over the next 10 to 14 days.     Patient Instructions  -It was nice meeting you today!  -Tramadol twice daily as needed for pain.  -Flu shot today.  -I believe you would benefit from a referral to psychiatry so I will arrange. You are already at the maximal doses of Cymbalta.     Chaya JanEstela Hernandez Acosta, MD [facility]

## 2018-05-27 ENCOUNTER — Other Ambulatory Visit: Payer: Self-pay | Admitting: Emergency Medicine

## 2018-05-27 DIAGNOSIS — J029 Acute pharyngitis, unspecified: Secondary | ICD-10-CM

## 2018-05-27 NOTE — Telephone Encounter (Signed)
Requested Prescriptions  Pending Prescriptions Disp Refills  . cetirizine (ZYRTEC) 10 MG tablet [Pharmacy Med Name: CETIRIZINE HCL 10 MG TABLET] 90 tablet 3    Sig: TAKE 1 TABLET BY MOUTH EVERY DAY     Ear, Nose, and Throat:  Antihistamines Passed - 05/27/2018  2:33 PM      Passed - Valid encounter within last 12 months    Recent Outpatient Visits          3 months ago ANXIETY DEPRESSION   Adult nurse [facility] at , Limmie Patricia, MD   9 months ago Routine physical examination   Primary Care at [facility], Myrle Sheng, MD   1 year ago Sore throat   Primary Care at [facility], Eilleen Kempf, MD   1 year ago Abdominal pain, LLQ   Primary Care at [facility], Myrle Sheng, MD   1 year ago ANXIETY DEPRESSION   Primary Care at [facility], Myrle Sheng, MD

## 2019-01-27 ENCOUNTER — Other Ambulatory Visit: Payer: Self-pay | Admitting: Internal Medicine

## 2019-01-27 DIAGNOSIS — F341 Dysthymic disorder: Secondary | ICD-10-CM

## 2019-01-27 NOTE — Telephone Encounter (Signed)
Office visit 01/28/18: -She is already on maximal doses of Cymbalta. -I believe she is quite a complex case and believe she would benefit from referral to psychiatry so I will arrange.

## 2019-04-02 ENCOUNTER — Other Ambulatory Visit: Payer: Self-pay | Admitting: Internal Medicine

## 2019-04-02 DIAGNOSIS — F341 Dysthymic disorder: Secondary | ICD-10-CM

## 2019-05-12 ENCOUNTER — Other Ambulatory Visit: Payer: Self-pay | Admitting: Internal Medicine

## 2019-05-12 DIAGNOSIS — F341 Dysthymic disorder: Secondary | ICD-10-CM

## 2019-06-14 ENCOUNTER — Other Ambulatory Visit: Payer: Self-pay | Admitting: Internal Medicine

## 2019-06-14 DIAGNOSIS — F341 Dysthymic disorder: Secondary | ICD-10-CM

## 2019-09-15 ENCOUNTER — Other Ambulatory Visit: Payer: Self-pay | Admitting: Internal Medicine

## 2019-09-15 DIAGNOSIS — F341 Dysthymic disorder: Secondary | ICD-10-CM

## 2019-09-15 NOTE — Telephone Encounter (Signed)
Ok to send in 20 days, 40 pills. Needs follow up with PCP

## 2019-09-15 NOTE — Telephone Encounter (Signed)
Rx done. 

## 2019-10-18 ENCOUNTER — Other Ambulatory Visit: Payer: Self-pay | Admitting: Adult Health

## 2019-10-18 DIAGNOSIS — F341 Dysthymic disorder: Secondary | ICD-10-CM

## 2019-10-18 NOTE — Telephone Encounter (Signed)
Dr. Johnston Diaz saw patient on 01/28/18 to establish care. She has depression and fibromyalgia. She has not been seen since that visit. She states that due to COVID she has not seen a physician.  She was unsure if she was going to continue to follow at [facility].  I told her she needed to schedule an appointment to continue to get refills. She declined today, stating her husband was going to have "major back surgery" and she didn't drive. I offered to do a video visit and she stated that her sister-in-law saw a physician and she wasn't sure if it was Dr. Ardyth Harps or not. She wants [place] to call back tomorrow to give her time to check with SIL.

## 2020-12-19 ENCOUNTER — Telehealth: Payer: Self-pay | Admitting: Internal Medicine

## 2020-12-19 NOTE — Telephone Encounter (Signed)
Patient needs appointment--hasn't been seen since 01/2018.

## 2021-10-14 ENCOUNTER — Other Ambulatory Visit (HOSPITAL_BASED_OUTPATIENT_CLINIC_OR_DEPARTMENT_OTHER): Payer: Self-pay

## 2021-10-14 ENCOUNTER — Other Ambulatory Visit (HOSPITAL_COMMUNITY): Payer: Self-pay

## 2021-10-14 DIAGNOSIS — E7800 Pure hypercholesterolemia, unspecified: Secondary | ICD-10-CM

## 2021-10-22 ENCOUNTER — Ambulatory Visit (HOSPITAL_BASED_OUTPATIENT_CLINIC_OR_DEPARTMENT_OTHER)
Admission: RE | Admit: 2021-10-22 | Discharge: 2021-10-22 | Disposition: A | Discharge status: Home / Self Care | Payer: Federal, State, Local not specified - PPO | Source: Ambulatory Visit

## 2021-10-22 DIAGNOSIS — E7800 Pure hypercholesterolemia, unspecified: Secondary | ICD-10-CM | POA: Insufficient documentation

## 2022-11-11 ENCOUNTER — Emergency Department (HOSPITAL_BASED_OUTPATIENT_CLINIC_OR_DEPARTMENT_OTHER)
Admission: EM | Admit: 2022-11-11 | Discharge: 2022-11-11 | Disposition: A | Discharge status: Home / Self Care | Payer: Federal, State, Local not specified - PPO | Attending: Emergency Medicine | Admitting: Emergency Medicine

## 2022-11-11 ENCOUNTER — Other Ambulatory Visit: Payer: Self-pay

## 2022-11-11 ENCOUNTER — Emergency Department (HOSPITAL_BASED_OUTPATIENT_CLINIC_OR_DEPARTMENT_OTHER): Payer: Federal, State, Local not specified - PPO

## 2022-11-11 DIAGNOSIS — R112 Nausea with vomiting, unspecified: Secondary | ICD-10-CM | POA: Diagnosis present

## 2022-11-11 DIAGNOSIS — Z1152 Encounter for screening for COVID-19: Secondary | ICD-10-CM | POA: Diagnosis not present

## 2022-11-11 DIAGNOSIS — K529 Noninfective gastroenteritis and colitis, unspecified: Secondary | ICD-10-CM | POA: Insufficient documentation

## 2022-11-11 LAB — CBC
HCT: 45.3 % (ref 36.0–46.0)
Hemoglobin: 15.1 g/dL — ABNORMAL HIGH (ref 12.0–15.0)
MCH: 28.4 pg (ref 26.0–34.0)
MCHC: 33.3 g/dL (ref 30.0–36.0)
MCV: 85.3 fL (ref 80.0–100.0)
Platelets: 410 10*3/uL — ABNORMAL HIGH (ref 150–400)
RBC: 5.31 MIL/uL — ABNORMAL HIGH (ref 3.87–5.11)
RDW: 13.5 % (ref 11.5–15.5)
WBC: 13.8 10*3/uL — ABNORMAL HIGH (ref 4.0–10.5)
nRBC: 0 % (ref 0.0–0.2)

## 2022-11-11 LAB — COMPREHENSIVE METABOLIC PANEL
ALT: 17 U/L (ref 0–44)
AST: 18 U/L (ref 15–41)
Albumin: 4.1 g/dL (ref 3.5–5.0)
Alkaline Phosphatase: 86 U/L (ref 38–126)
Anion gap: 12 (ref 5–15)
BUN: 9 mg/dL (ref 8–23)
CO2: 25 mmol/L (ref 22–32)
Calcium: 9.4 mg/dL (ref 8.9–10.3)
Chloride: 101 mmol/L (ref 98–111)
Creatinine, Ser: 1.06 mg/dL — ABNORMAL HIGH (ref 0.44–1.00)
GFR, Estimated: 56 mL/min — ABNORMAL LOW (ref >60)
Glucose, Bld: 105 mg/dL — ABNORMAL HIGH (ref 70–99)
Potassium: 3.2 mmol/L — ABNORMAL LOW (ref 3.5–5.1)
Sodium: 138 mmol/L (ref 135–145)
Total Bilirubin: 0.3 mg/dL (ref 0.3–1.2)
Total Protein: 7.2 g/dL (ref 6.5–8.1)

## 2022-11-11 LAB — RESP PANEL BY RT-PCR (RSV, FLU A&B, COVID)  RVPGX2
Influenza A by PCR: NEGATIVE
Influenza B by PCR: NEGATIVE
Resp Syncytial Virus by PCR: NEGATIVE
SARS Coronavirus 2 by RT PCR: NEGATIVE

## 2022-11-11 LAB — URINALYSIS, ROUTINE W REFLEX MICROSCOPIC
Bilirubin Urine: NEGATIVE
Glucose, UA: NEGATIVE mg/dL
Hgb urine dipstick: NEGATIVE
Ketones, ur: NEGATIVE mg/dL
Leukocytes,Ua: NEGATIVE
Nitrite: NEGATIVE
Protein, ur: NEGATIVE mg/dL
Specific Gravity, Urine: <1.005 — ABNORMAL LOW (ref 1.005–1.030)
pH: 6 (ref 5.0–8.0)

## 2022-11-11 LAB — LIPASE, BLOOD: Lipase: <10 U/L — ABNORMAL LOW (ref 11–51)

## 2022-11-11 MED ORDER — PROMETHAZINE HCL 25 MG/ML IJ SOLN
INTRAMUSCULAR | Status: AC
  Administered 2022-11-11: 25 mg
  Filled 2022-11-11: qty 1

## 2022-11-11 MED ORDER — POTASSIUM CHLORIDE CRYS ER 20 MEQ PO TBCR
40.0000 meq | EXTENDED_RELEASE_TABLET | Freq: Once | ORAL | Status: AC
  Administered 2022-11-11: 40 meq via ORAL
  Filled 2022-11-11: qty 2

## 2022-11-11 MED ORDER — IOHEXOL 300 MG/ML  SOLN
100.0000 mL | Freq: Once | INTRAMUSCULAR | Status: AC | PRN
Start: 2022-11-11 — End: 2022-11-11
  Administered 2022-11-11: 90 mL via INTRAVENOUS

## 2022-11-11 MED ORDER — FAMOTIDINE IN NACL 20-0.9 MG/50ML-% IV SOLN
20.0000 mg | Freq: Once | INTRAVENOUS | Status: AC
  Administered 2022-11-11: 20 mg via INTRAVENOUS
  Filled 2022-11-11: qty 50

## 2022-11-11 MED ORDER — PROMETHAZINE HCL 25 MG PO TABS: 12.5000 mg | ORAL_TABLET | Freq: Four times a day (QID) | ORAL | 0 refills | Status: AC | PRN

## 2022-11-11 MED ORDER — PROMETHAZINE HCL 25 MG PO TABS
25.0000 mg | ORAL_TABLET | Freq: Once | ORAL | Status: DC
Start: 2022-11-11 — End: 2022-11-11

## 2022-11-11 MED ORDER — SODIUM CHLORIDE 0.9 % IV SOLN
12.5000 mg | Freq: Once | INTRAVENOUS | Status: AC
  Administered 2022-11-11: 12.5 mg via INTRAVENOUS
  Filled 2022-11-11: qty 0.5

## 2022-11-11 NOTE — ED Triage Notes (Addendum)
+  N/+V/+D since Friday. Headaches, fatigue. Afebrile. No one else sick at home.  Cannot take zofran. HX fibromyalgia and migraines has phenegran at home.

## 2022-11-11 NOTE — ED Notes (Signed)
Pt unable to void, states she is so dehydrated, given a push up icee and water to encourage ua.

## 2022-11-11 NOTE — Discharge Instructions (Signed)
You were seen in the emergency department for your nausea, vomiting and diarrhea.  You had no signs of severe dehydration and your potassium level was mildly low.  We did give you potassium repletion in the ER.  Your CAT scan showed no signs of infection and you likely have a viral infection causing your vomiting and diarrhea.  You can take Phenergan as needed for nausea and you can use over-the-counter Imodium for your diarrhea.  You should make sure that you are drinking plenty of fluids and staying well-hydrated.  You can follow-up with your primary doctor in the next few days to have your symptoms rechecked.  You should return to the emergency department for repetitive vomiting despite the nausea medicine, worsening dehydration, severe abdominal pain or any other new or concerning symptoms.

## 2022-11-11 NOTE — ED Notes (Signed)
Pt removed shirt/bra,donned gown. Pt placed on 5 lead EKG.

## 2022-11-11 NOTE — ED Provider Notes (Signed)
[facility] AT [facility] Provider Note   CSN: [id number] Arrival date & time: 11/11/22  1415     History  Chief Complaint  Patient presents with   Emesis   Diarrhea    Mandy Diaz is a 72 y.o. female.  Patient is a 72 year old female with a past medical history of fibromyalgia and GERD resenting to the emergency department with nausea, vomiting and diarrhea.  The patient states that her symptoms have been ongoing since Friday.  She states that she has been having diarrhea at least once an hour.  She states that today she started to develop abdominal pain across her epigastrium.  She states that she has had a cholecystectomy.  She denies any fever.  States that today her vomit did appear green.  She denies any known sick contacts.  She denies any recent hospitalization, travel, antibiotic use or camping trip.  The history is provided by the patient and the spouse.  Emesis Associated symptoms: diarrhea   Diarrhea Associated symptoms: vomiting        Home Medications Prior to Admission medications   Medication Sig Start Date End Date Taking? Authorizing Provider  promethazine (PHENERGAN) 25 MG tablet Take 0.5 tablets (12.5 mg total) by mouth every 6 (six) hours as needed for nausea or vomiting. 11/11/22  Yes Theresia Lo, Turkey K, DO  atenolol (TENORMIN) 25 MG tablet TAKE 1 TO 2 TABLETS BY MOUTH DAILY 06/30/17   Ethelda Chick, MD  butalbital-acetaminophen-caffeine (FIORICET, ESGIC) [phone] MG tablet Take 1 tablet by mouth every 6 (six) hours as needed for headache. Needs office visit 09/16/17   Doristine Bosworth, MD  cetirizine (ZYRTEC) 10 MG tablet TAKE 1 TABLET BY MOUTH EVERY DAY 05/27/18   Georgina Quint, MD  Cholecalciferol 2000 units CAPS Take 4,000 Units by mouth daily.    [provider]  clidinium-chlordiazePOXIDE (LIBRAX) 5-2.5 MG capsule Take 1 capsule by mouth 2 (two) times daily as needed. 08/12/17   Ethelda Chick, MD   cyanocobalamin (,VITAMIN B-12,) 1000 MCG/ML injection Inject 1 mL (1,000 mcg total) into the muscle every 14 (fourteen) days. Patient taking differently: Inject 1,000 mcg into the muscle every 7 (seven) weeks.  02/23/14   Ethelda Chick, MD  cyclobenzaprine (FLEXERIL) 5 MG tablet TAKE 1 TABLET (5 MG TOTAL) BY MOUTH DAILY AS NEEDED FOR MUSCLE SPASMS. 06/30/17   Ethelda Chick, MD  DULoxetine (CYMBALTA) 60 MG capsule Take 2 capsules by mouth daily 09/15/19   Shirline Frees, NP  estradiol (ESTRACE) 1 MG tablet  08/07/17   [provider]  fluticasone (FLONASE) 50 MCG/ACT nasal spray Place 2 sprays into both nostrils daily. 05/21/17   Georgina Quint, MD  Magnesium Malate POWD Take 3,900 mg by mouth daily. TAKES 3 x 1300MG  CAPSULES ONCE DAILY    [provider]  NON FORMULARY Take 150 mg by mouth every morning. PROGENOLONE (for memory and hormonal balance).    [provider]  omeprazole (PRILOSEC) 20 MG capsule Take 1 capsule (20 mg total) by mouth daily. 08/11/17   Ethelda Chick, MD  progesterone (PROMETRIUM) 200 MG capsule  08/19/16   [provider]  thyroid (ARMOUR) 65 MG tablet Take 65 mg by mouth daily.    [provider]  traMADol (ULTRAM) 50 MG tablet Take 1 tablet (50 mg total) by mouth 2 (two) times daily. 01/28/18   Philip Aspen, Limmie Patricia, MD  traZODone (DESYREL) 50 MG tablet TAKE 1 TABLET BY  MOUTH EVERY DAY AT BEDTIME AS NEEDED FOR SLEEP 05/30/17   Ethelda Chick, MD      Allergies    Metoclopramide hcl, Sulfamethoxazole, Gabapentin, Zofran [ondansetron hcl], and Penicillins    Review of Systems   Review of Systems  Gastrointestinal:  Positive for diarrhea and vomiting.    Physical Exam Updated Vital Signs BP 129/76   Pulse 83   Temp 98.7 F (37.1 C) (Oral)   Resp 18   Wt 83.5 kg   SpO2 100%   BMI 29.70 kg/m  Physical Exam Vitals and nursing note reviewed.  Constitutional:      General: She is not in acute distress.     Appearance: Normal appearance.  HENT:     Head: Normocephalic and atraumatic.     Nose: Nose normal.     Mouth/Throat:     Mouth: Mucous membranes are dry.     Pharynx: Oropharynx is clear.  Eyes:     Extraocular Movements: Extraocular movements intact.     Conjunctiva/sclera: Conjunctivae normal.  Cardiovascular:     Rate and Rhythm: Normal rate and regular rhythm.     Heart sounds: Normal heart sounds.  Pulmonary:     Effort: Pulmonary effort is normal.     Breath sounds: Normal breath sounds.  Abdominal:     General: Abdomen is flat.     Palpations: Abdomen is soft.     Tenderness: There is abdominal tenderness (Epigastrium). There is no guarding or rebound.  Musculoskeletal:        General: Normal range of motion.     Cervical back: Normal range of motion.  Skin:    General: Skin is warm and dry.  Neurological:     General: No focal deficit present.     Mental Status: She is alert and oriented to person, place, and time.  Psychiatric:        Mood and Affect: Mood normal.        Behavior: Behavior normal.     ED Results / Procedures / Treatments   Labs (all labs ordered are listed, but only abnormal results are displayed) Labs Reviewed  LIPASE, BLOOD - Abnormal; Notable for the following components:      Result Value   Lipase <10 (*)    All other components within normal limits  COMPREHENSIVE METABOLIC PANEL - Abnormal; Notable for the following components:   Potassium 3.2 (*)    Glucose, Bld 105 (*)    Creatinine, Ser 1.06 (*)    GFR, Estimated 56 (*)    All other components within normal limits  CBC - Abnormal; Notable for the following components:   WBC 13.8 (*)    RBC 5.31 (*)    Hemoglobin 15.1 (*)    Platelets 410 (*)    All other components within normal limits  URINALYSIS, ROUTINE W REFLEX MICROSCOPIC - Abnormal; Notable for the following components:   Specific Gravity, Urine <1.005 (*)    All other components within normal limits  RESP PANEL BY  RT-PCR (RSV, FLU A&B, COVID)  [id number]  C DIFFICILE QUICK SCREEN W PCR REFLEX    GASTROINTESTINAL PANEL BY PCR, STOOL (REPLACES STOOL CULTURE)    EKG EKG Interpretation Date/Time:  Wednesday November 11 2022 14:43:55 EDT Ventricular Rate:  105 PR Interval:  156 QRS Duration:  84 QT Interval:  352 QTC Calculation: 465 R Axis:   -1  Text Interpretation: Sinus tachycardia ST & T wave abnormality, consider lateral ischemia Abnormal ECG When  compared with ECG of 10-Dec-2015 15:07, PREVIOUS ECG IS PRESENT Confirmed by Blane Ohara [phone]) on 11/11/2022 2:47:29 PM  Radiology CT ABDOMEN PELVIS W CONTRAST  Result Date: 11/11/2022 CLINICAL DATA:  Acute abdominal pain for several days, initial encounter EXAM: CT ABDOMEN AND PELVIS WITH CONTRAST TECHNIQUE: Multidetector CT imaging of the abdomen and pelvis was performed using the standard protocol following bolus administration of intravenous contrast. RADIATION DOSE REDUCTION: This exam was performed according to the departmental dose-optimization program which includes automated exposure control, adjustment of the mA and/or kV according to patient size and/or use of iterative reconstruction technique. CONTRAST:  90mL OMNIPAQUE IOHEXOL 300 MG/ML  SOLN COMPARISON:  02/19/2012 FINDINGS: Lower chest: No acute abnormality. Hepatobiliary: No focal liver abnormality is seen. Status post cholecystectomy. No biliary dilatation. Pancreas: Unremarkable. No pancreatic ductal dilatation or surrounding inflammatory changes. Spleen: Normal in size without focal abnormality. Adrenals/Urinary Tract: Adrenal glands are within normal limits. Kidneys are well visualized bilaterally. No renal calculi or obstructive changes are seen. The bladder is partially distended. Stomach/Bowel: Scattered diverticular change of the colon is noted without evidence of diverticulitis. No obstructive or inflammatory changes of the colon seen. The appendix is not well visualized. No  inflammatory changes are seen. Small bowel and stomach are unremarkable. Vascular/Lymphatic: Aortic atherosclerosis. No enlarged abdominal or pelvic lymph nodes. Reproductive: Uterus and bilateral adnexa are unremarkable. Other: No abdominal wall hernia or abnormality. No abdominopelvic ascites. Musculoskeletal: Degenerative changes of the lumbar spine are seen. IMPRESSION: Scattered diverticular change without evidence of diverticulitis. No other focal abnormality is noted. Electronically Signed   By: Alcide Clever M.D.   On: 11/11/2022 20:14    Procedures Procedures    Medications Ordered in ED Medications  potassium chloride SA (KLOR-CON M) CR tablet 40 mEq (40 mEq Oral Given 11/11/22 1729)  famotidine (PEPCID) IVPB 20 mg premix (0 mg Intravenous Stopped 11/11/22 1729)  promethazine (PHENERGAN) 12.5 mg in sodium chloride 0.9 % 50 mL IVPB (0 mg Intravenous Stopped 11/11/22 1727)  promethazine (PHENERGAN) 25 MG/ML injection (25 mg  Given 11/11/22 1657)  iohexol (OMNIPAQUE) 300 MG/ML solution 100 mL (90 mLs Intravenous Contrast Given 11/11/22 1746)    ED Course/ Medical Decision Making/ A&P Clinical Course as of 11/11/22 2039  Wed Nov 11, 2022  2027 CT with diverticulosis without diverticulitis, has had no diarrhea or vomiting while in the ED. She is stable for discharge home with outpatient follow up. [VK]    Clinical Course User Index [VK] Rexford Maus, DO                                 Medical Decision Making This patient presents to the ED with chief complaint(s) of N/V/D with pertinent past medical history of fibromyalgia, GERD which further complicates the presenting complaint. The complaint involves an extensive differential diagnosis and also carries with it a high risk of complications and morbidity.    The differential diagnosis includes dehydration, electrolyte abnormality, gastritis, GERD, gastroenteritis, infectious diarrhea less likely as no C. difficile or bacterial  diarrhea risk factors, considering intra-abdominal infection  Additional history obtained: Additional history obtained from spouse Records reviewed Care Everywhere/External Records  ED Course and Reassessment: On patient's arrival she was initially mildly tachycardic and hypertensive otherwise hemodynamically stable in no acute distress.  She is initially evaluated by triage and had EKG and labs performed.  Labs showed a leukocytosis though also with increased hemoglobin and platelets, may  be hemoconcentration from dehydration.  She did have mild hypokalemia on labs otherwise within normal range.  Urine is pending.  Due to patient's leukocytosis and developing abdominal pain, will do CT abdomen pelvis to evaluate for cause of her symptoms.  Stool studies have been ordered should she have diarrhea while she is here.  She will be given symptomatic management and will be closely reassessed.  Independent labs interpretation:  The following labs were independently interpreted: Mild hypokalemia, leukocytosis  Independent visualization of imaging: - I independently visualized the following imaging with scope of interpretation limited to determining acute life threatening conditions related to emergency care: CT AP, which revealed no acute disease  Consultation: - Consulted or discussed management/test interpretation w/ external professional: N/a  Consideration for admission or further workup: Patient has no emergent conditions requiring admission or further work-up at this time and is stable for discharge home with primary care follow-up  Social Determinants of health: N/A    Amount and/or Complexity of Data Reviewed Labs: ordered. Radiology: ordered.  Risk Prescription drug management.          Final Clinical Impression(s) / ED Diagnoses Final diagnoses:  Gastroenteritis    Rx / DC Orders ED Discharge Orders          Ordered    promethazine (PHENERGAN) 25 MG tablet  Every 6  hours PRN        11/11/22 2037              Rexford Maus, DO 11/11/22 2039

## 2022-11-11 NOTE — ED Provider Triage Note (Signed)
Emergency Medicine Provider Triage Evaluation Note  Mandy Diaz , a 72 y.o. female  was evaluated in triage.  Pt complains of abdominal discomfort, vomiting and diarrhea   Review of Systems  Positive: Nvd and pain Negative: cough  Physical Exam  BP (!) 174/145 (BP Location: Left Arm)   Pulse (!) 105   Temp (!) 97 F (36.1 C)   Resp 20   Wt 83.5 kg   SpO2 100%   BMI 29.70 kg/m  Gen:   Awake, no distress   Resp:  Normal effort  MSK:   Moves extremities without difficulty  Other:    Medical Decision Making  Medically screening exam initiated at 2:40 PM.  Appropriate orders placed.  Mandy Diaz was informed that the remainder of the evaluation will be completed by another provider, this initial triage assessment does not replace that evaluation, and the importance of remaining in the ED until their evaluation is complete.     Elson Areas, [place] 11/11/22 1441

## 2022-11-11 NOTE — ED Notes (Signed)
Out to CT
